# Patient Record
Sex: Female | Born: 1955 | Race: White | Hispanic: No | Marital: Married | Smoking: Former smoker
Health system: Southern US, Community
[De-identification: ages and names within clinical notes are randomized; demographics above are authoritative.]

## PROBLEM LIST (undated history)

## (undated) DIAGNOSIS — J342 Deviated nasal septum: Secondary | ICD-10-CM

## (undated) DIAGNOSIS — K635 Polyp of colon: Secondary | ICD-10-CM

## (undated) DIAGNOSIS — J189 Pneumonia, unspecified organism: Secondary | ICD-10-CM

## (undated) DIAGNOSIS — I7300 Raynaud's syndrome without gangrene: Secondary | ICD-10-CM

## (undated) DIAGNOSIS — R011 Cardiac murmur, unspecified: Secondary | ICD-10-CM

## (undated) DIAGNOSIS — E215 Disorder of parathyroid gland, unspecified: Secondary | ICD-10-CM

## (undated) DIAGNOSIS — E079 Disorder of thyroid, unspecified: Secondary | ICD-10-CM

## (undated) DIAGNOSIS — F419 Anxiety disorder, unspecified: Secondary | ICD-10-CM

## (undated) DIAGNOSIS — K5790 Diverticulosis of intestine, part unspecified, without perforation or abscess without bleeding: Secondary | ICD-10-CM

## (undated) DIAGNOSIS — R7303 Prediabetes: Secondary | ICD-10-CM

## (undated) DIAGNOSIS — K5792 Diverticulitis of intestine, part unspecified, without perforation or abscess without bleeding: Secondary | ICD-10-CM

## (undated) DIAGNOSIS — R569 Unspecified convulsions: Secondary | ICD-10-CM

## (undated) DIAGNOSIS — M810 Age-related osteoporosis without current pathological fracture: Secondary | ICD-10-CM

## (undated) DIAGNOSIS — E785 Hyperlipidemia, unspecified: Secondary | ICD-10-CM

## (undated) DIAGNOSIS — E222 Syndrome of inappropriate secretion of antidiuretic hormone: Secondary | ICD-10-CM

## (undated) DIAGNOSIS — L219 Seborrheic dermatitis, unspecified: Secondary | ICD-10-CM

## (undated) DIAGNOSIS — I10 Essential (primary) hypertension: Secondary | ICD-10-CM

## (undated) DIAGNOSIS — E871 Hypo-osmolality and hyponatremia: Secondary | ICD-10-CM

## (undated) DIAGNOSIS — I341 Nonrheumatic mitral (valve) prolapse: Secondary | ICD-10-CM

## (undated) DIAGNOSIS — H409 Unspecified glaucoma: Secondary | ICD-10-CM

## (undated) HISTORY — DX: Hyperlipidemia, unspecified: E78.5

## (undated) HISTORY — DX: Unspecified convulsions: R56.9

## (undated) HISTORY — DX: Diverticulitis of intestine, part unspecified, without perforation or abscess without bleeding: K57.92

## (undated) HISTORY — DX: Unspecified glaucoma: H40.9

## (undated) HISTORY — DX: Hypo-osmolality and hyponatremia: E87.1

## (undated) HISTORY — DX: Disorder of thyroid, unspecified: E07.9

## (undated) HISTORY — DX: Syndrome of inappropriate secretion of antidiuretic hormone: E22.2

## (undated) HISTORY — PX: CRYOTHERAPY: SHX1416

## (undated) HISTORY — DX: Raynaud's syndrome without gangrene: I73.00

## (undated) HISTORY — PX: ABDOMINAL HYSTERECTOMY: SHX81

## (undated) HISTORY — DX: Essential (primary) hypertension: I10

## (undated) HISTORY — DX: Anxiety disorder, unspecified: F41.9

## (undated) HISTORY — DX: Deviated nasal septum: J34.2

## (undated) HISTORY — DX: Diverticulosis of intestine, part unspecified, without perforation or abscess without bleeding: K57.90

## (undated) HISTORY — DX: Polyp of colon: K63.5

## (undated) HISTORY — DX: Nonrheumatic mitral (valve) prolapse: I34.1

## (undated) HISTORY — DX: Age-related osteoporosis without current pathological fracture: M81.0

## (undated) HISTORY — DX: Disorder of parathyroid gland, unspecified: E21.5

## (undated) HISTORY — DX: Cardiac murmur, unspecified: R01.1

## (undated) HISTORY — PX: TUBAL LIGATION: SHX77

---

## 1997-10-31 ENCOUNTER — Other Ambulatory Visit: Admission: RE | Admit: 1997-10-31 | Discharge: 1997-10-31 | Payer: Self-pay | Admitting: Gynecology

## 1998-11-18 ENCOUNTER — Other Ambulatory Visit: Admission: RE | Admit: 1998-11-18 | Discharge: 1998-11-18 | Payer: Self-pay | Admitting: Gynecology

## 1998-12-03 ENCOUNTER — Encounter (INDEPENDENT_AMBULATORY_CARE_PROVIDER_SITE_OTHER): Payer: Self-pay

## 1998-12-03 ENCOUNTER — Other Ambulatory Visit: Admission: RE | Admit: 1998-12-03 | Discharge: 1998-12-03 | Payer: Self-pay | Admitting: Gynecology

## 1999-12-03 ENCOUNTER — Other Ambulatory Visit: Admission: RE | Admit: 1999-12-03 | Discharge: 1999-12-03 | Payer: Self-pay | Admitting: Gynecology

## 2001-01-17 ENCOUNTER — Other Ambulatory Visit: Admission: RE | Admit: 2001-01-17 | Discharge: 2001-01-17 | Payer: Self-pay | Admitting: Gynecology

## 2002-01-17 ENCOUNTER — Other Ambulatory Visit: Admission: RE | Admit: 2002-01-17 | Discharge: 2002-01-17 | Payer: Self-pay | Admitting: Gynecology

## 2003-01-31 ENCOUNTER — Other Ambulatory Visit: Admission: RE | Admit: 2003-01-31 | Discharge: 2003-01-31 | Payer: Self-pay | Admitting: Gynecology

## 2003-01-31 ENCOUNTER — Encounter: Payer: Self-pay | Admitting: Gynecology

## 2003-01-31 ENCOUNTER — Ambulatory Visit (HOSPITAL_COMMUNITY): Admission: RE | Admit: 2003-01-31 | Discharge: 2003-01-31 | Payer: Self-pay | Admitting: Gynecology

## 2003-02-18 ENCOUNTER — Encounter: Payer: Self-pay | Admitting: Gynecology

## 2003-02-18 ENCOUNTER — Ambulatory Visit (HOSPITAL_COMMUNITY): Admission: RE | Admit: 2003-02-18 | Discharge: 2003-02-18 | Payer: Self-pay | Admitting: Gynecology

## 2004-03-10 ENCOUNTER — Other Ambulatory Visit: Admission: RE | Admit: 2004-03-10 | Discharge: 2004-03-10 | Payer: Self-pay | Admitting: Gynecology

## 2005-03-18 ENCOUNTER — Other Ambulatory Visit: Admission: RE | Admit: 2005-03-18 | Discharge: 2005-03-18 | Payer: Self-pay | Admitting: Gynecology

## 2005-07-12 ENCOUNTER — Other Ambulatory Visit: Admission: RE | Admit: 2005-07-12 | Discharge: 2005-07-12 | Payer: Self-pay | Admitting: Gynecology

## 2006-03-23 ENCOUNTER — Other Ambulatory Visit: Admission: RE | Admit: 2006-03-23 | Discharge: 2006-03-23 | Payer: Self-pay | Admitting: Gynecology

## 2007-02-16 ENCOUNTER — Encounter: Payer: Self-pay | Admitting: Family Medicine

## 2007-02-22 LAB — CONVERTED CEMR LAB
Pap Smear: NORMAL
Pap Smear: NORMAL

## 2007-04-11 ENCOUNTER — Other Ambulatory Visit: Admission: RE | Admit: 2007-04-11 | Discharge: 2007-04-11 | Payer: Self-pay | Admitting: *Deleted

## 2007-11-22 HISTORY — PX: NM MYOCAR PERF WALL MOTION: HXRAD629

## 2007-11-22 HISTORY — PX: TRANSTHORACIC ECHOCARDIOGRAM: SHX275

## 2007-12-21 ENCOUNTER — Encounter: Payer: Self-pay | Admitting: Family Medicine

## 2008-08-13 ENCOUNTER — Ambulatory Visit: Payer: Self-pay | Admitting: Family Medicine

## 2008-08-13 DIAGNOSIS — K5730 Diverticulosis of large intestine without perforation or abscess without bleeding: Secondary | ICD-10-CM | POA: Insufficient documentation

## 2008-08-13 DIAGNOSIS — J301 Allergic rhinitis due to pollen: Secondary | ICD-10-CM | POA: Insufficient documentation

## 2008-08-13 DIAGNOSIS — Z8719 Personal history of other diseases of the digestive system: Secondary | ICD-10-CM | POA: Insufficient documentation

## 2008-08-13 DIAGNOSIS — I10 Essential (primary) hypertension: Secondary | ICD-10-CM | POA: Insufficient documentation

## 2008-08-13 DIAGNOSIS — E785 Hyperlipidemia, unspecified: Secondary | ICD-10-CM | POA: Insufficient documentation

## 2008-08-13 DIAGNOSIS — M810 Age-related osteoporosis without current pathological fracture: Secondary | ICD-10-CM | POA: Insufficient documentation

## 2008-08-13 DIAGNOSIS — L259 Unspecified contact dermatitis, unspecified cause: Secondary | ICD-10-CM | POA: Insufficient documentation

## 2008-08-13 DIAGNOSIS — I059 Rheumatic mitral valve disease, unspecified: Secondary | ICD-10-CM | POA: Insufficient documentation

## 2008-10-25 ENCOUNTER — Ambulatory Visit: Payer: Self-pay | Admitting: Family Medicine

## 2008-10-25 LAB — CONVERTED CEMR LAB: LDL Cholesterol: 83 mg/dL

## 2008-10-30 LAB — CONVERTED CEMR LAB
ALT: 13 units/L (ref 0–35)
Albumin: 4.1 g/dL (ref 3.5–5.2)
BUN: 11 mg/dL (ref 6–23)
Chloride: 96 meq/L (ref 96–112)
Direct LDL: 83.4 mg/dL
Eosinophils Relative: 1.3 % (ref 0.0–5.0)
Glucose, Bld: 100 mg/dL — ABNORMAL HIGH (ref 70–99)
HCT: 37.1 % (ref 36.0–46.0)
Lymphs Abs: 2.3 10*3/uL (ref 0.7–4.0)
MCV: 99.5 fL (ref 78.0–100.0)
Monocytes Absolute: 0.8 10*3/uL (ref 0.1–1.0)
Platelets: 232 10*3/uL (ref 150.0–400.0)
Potassium: 4.6 meq/L (ref 3.5–5.1)
RDW: 12.5 % (ref 11.5–14.6)
TSH: 0.72 microintl units/mL (ref 0.35–5.50)
Total Bilirubin: 0.7 mg/dL (ref 0.3–1.2)
WBC: 7.8 10*3/uL (ref 4.5–10.5)

## 2008-11-01 ENCOUNTER — Encounter: Payer: Self-pay | Admitting: Family Medicine

## 2008-11-01 ENCOUNTER — Ambulatory Visit: Payer: Self-pay | Admitting: Family Medicine

## 2008-11-01 ENCOUNTER — Other Ambulatory Visit: Admission: RE | Admit: 2008-11-01 | Discharge: 2008-11-01 | Payer: Self-pay | Admitting: Family Medicine

## 2008-11-01 DIAGNOSIS — E222 Syndrome of inappropriate secretion of antidiuretic hormone: Secondary | ICD-10-CM | POA: Insufficient documentation

## 2008-11-04 ENCOUNTER — Encounter (INDEPENDENT_AMBULATORY_CARE_PROVIDER_SITE_OTHER): Payer: Self-pay | Admitting: *Deleted

## 2008-11-06 ENCOUNTER — Encounter (INDEPENDENT_AMBULATORY_CARE_PROVIDER_SITE_OTHER): Payer: Self-pay | Admitting: *Deleted

## 2008-11-06 ENCOUNTER — Ambulatory Visit: Payer: Self-pay | Admitting: Family Medicine

## 2008-12-07 ENCOUNTER — Ambulatory Visit: Payer: Self-pay | Admitting: Family Medicine

## 2008-12-10 ENCOUNTER — Encounter: Payer: Self-pay | Admitting: Family Medicine

## 2008-12-10 ENCOUNTER — Telehealth: Payer: Self-pay | Admitting: Family Medicine

## 2008-12-17 ENCOUNTER — Encounter (INDEPENDENT_AMBULATORY_CARE_PROVIDER_SITE_OTHER): Payer: Self-pay | Admitting: *Deleted

## 2008-12-17 LAB — CONVERTED CEMR LAB
Cholesterol: 225 mg/dL
Creatinine, Ser: 0.9 mg/dL
HDL: 122 mg/dL
LDL Cholesterol: 85 mg/dL
TSH: 1.13 microintl units/mL
Triglycerides: 44 mg/dL

## 2008-12-27 ENCOUNTER — Encounter: Payer: Self-pay | Admitting: Family Medicine

## 2008-12-27 ENCOUNTER — Ambulatory Visit: Payer: Self-pay | Admitting: Family Medicine

## 2009-06-14 ENCOUNTER — Ambulatory Visit: Payer: Self-pay | Admitting: Family Medicine

## 2009-06-18 ENCOUNTER — Telehealth: Payer: Self-pay | Admitting: Internal Medicine

## 2009-06-20 ENCOUNTER — Ambulatory Visit: Payer: Self-pay | Admitting: Gastroenterology

## 2009-06-20 ENCOUNTER — Encounter: Payer: Self-pay | Admitting: Internal Medicine

## 2009-08-15 ENCOUNTER — Ambulatory Visit: Payer: Self-pay | Admitting: Internal Medicine

## 2009-08-18 ENCOUNTER — Telehealth (INDEPENDENT_AMBULATORY_CARE_PROVIDER_SITE_OTHER): Payer: Self-pay | Admitting: *Deleted

## 2009-08-18 ENCOUNTER — Encounter: Payer: Self-pay | Admitting: Internal Medicine

## 2009-10-16 ENCOUNTER — Encounter: Payer: Self-pay | Admitting: Family Medicine

## 2009-10-29 ENCOUNTER — Ambulatory Visit: Payer: Self-pay | Admitting: Family Medicine

## 2009-10-29 LAB — CONVERTED CEMR LAB
AST: 24 units/L (ref 0–37)
Alkaline Phosphatase: 56 units/L (ref 39–117)
Basophils Absolute: 0 10*3/uL (ref 0.0–0.1)
Bilirubin, Direct: 0.2 mg/dL (ref 0.0–0.3)
Calcium: 10.2 mg/dL (ref 8.4–10.5)
GFR calc non Af Amer: 130.56 mL/min (ref >60)
HCT: 41.7 % (ref 36.0–46.0)
HDL: 135.6 mg/dL (ref >39.00)
Hemoglobin: 14.3 g/dL (ref 12.0–15.0)
Lymphs Abs: 2.2 10*3/uL (ref 0.7–4.0)
MCV: 101.1 fL — ABNORMAL HIGH (ref 78.0–100.0)
Monocytes Absolute: 0.8 10*3/uL (ref 0.1–1.0)
Neutro Abs: 5.9 10*3/uL (ref 1.4–7.7)
Platelets: 201 10*3/uL (ref 150.0–400.0)
Potassium: 4.3 meq/L (ref 3.5–5.1)
RDW: 13.9 % (ref 11.5–14.6)
Sodium: 132 meq/L — ABNORMAL LOW (ref 135–145)
TSH: 1.22 microintl units/mL (ref 0.35–5.50)
Total Bilirubin: 0.7 mg/dL (ref 0.3–1.2)
Total CHOL/HDL Ratio: 2
VLDL: 7.6 mg/dL (ref 0.0–40.0)

## 2009-11-04 ENCOUNTER — Other Ambulatory Visit: Admission: RE | Admit: 2009-11-04 | Discharge: 2009-11-04 | Payer: Self-pay | Admitting: Family Medicine

## 2009-11-04 ENCOUNTER — Ambulatory Visit: Payer: Self-pay | Admitting: Family Medicine

## 2009-11-04 DIAGNOSIS — R7309 Other abnormal glucose: Secondary | ICD-10-CM | POA: Insufficient documentation

## 2009-11-04 LAB — HM PAP SMEAR

## 2009-11-05 LAB — CONVERTED CEMR LAB: Vit D, 25-Hydroxy: 52 ng/mL (ref 30–89)

## 2009-11-13 LAB — CONVERTED CEMR LAB: Pap Smear: NEGATIVE

## 2009-11-14 ENCOUNTER — Encounter (INDEPENDENT_AMBULATORY_CARE_PROVIDER_SITE_OTHER): Payer: Self-pay | Admitting: *Deleted

## 2009-12-17 ENCOUNTER — Telehealth: Payer: Self-pay | Admitting: Internal Medicine

## 2009-12-23 ENCOUNTER — Telehealth: Payer: Self-pay | Admitting: Family Medicine

## 2009-12-24 ENCOUNTER — Ambulatory Visit: Payer: Self-pay | Admitting: Family Medicine

## 2009-12-24 ENCOUNTER — Telehealth: Payer: Self-pay | Admitting: Family Medicine

## 2009-12-29 ENCOUNTER — Encounter: Payer: Self-pay | Admitting: Family Medicine

## 2009-12-29 ENCOUNTER — Ambulatory Visit: Payer: Self-pay | Admitting: Internal Medicine

## 2010-01-20 ENCOUNTER — Ambulatory Visit: Payer: Self-pay | Admitting: Family Medicine

## 2010-01-29 ENCOUNTER — Encounter: Payer: Self-pay | Admitting: Family Medicine

## 2010-03-04 ENCOUNTER — Telehealth: Payer: Self-pay | Admitting: Family Medicine

## 2010-03-11 ENCOUNTER — Encounter: Payer: Self-pay | Admitting: Family Medicine

## 2010-03-12 ENCOUNTER — Inpatient Hospital Stay (HOSPITAL_COMMUNITY): Admission: EM | Admit: 2010-03-12 | Discharge: 2010-03-16 | Payer: Self-pay | Admitting: Emergency Medicine

## 2010-03-24 ENCOUNTER — Ambulatory Visit: Payer: Self-pay | Admitting: Family Medicine

## 2010-03-25 ENCOUNTER — Telehealth: Payer: Self-pay | Admitting: Family Medicine

## 2010-03-25 LAB — CONVERTED CEMR LAB
Basophils Relative: 0.3 % (ref 0.0–3.0)
CO2: 30 meq/L (ref 19–32)
Chloride: 96 meq/L (ref 96–112)
Creatinine, Ser: 0.7 mg/dL (ref 0.4–1.2)
Eosinophils Absolute: 0.1 10*3/uL (ref 0.0–0.7)
Eosinophils Relative: 0.7 % (ref 0.0–5.0)
HCT: 38.5 % (ref 36.0–46.0)
Lymphs Abs: 2.2 10*3/uL (ref 0.7–4.0)
MCHC: 34.4 g/dL (ref 30.0–36.0)
MCV: 102.6 fL — ABNORMAL HIGH (ref 78.0–100.0)
Monocytes Absolute: 1 10*3/uL (ref 0.1–1.0)
Neutro Abs: 6.8 10*3/uL (ref 1.4–7.7)
Neutrophils Relative %: 67.7 % (ref 43.0–77.0)
Potassium: 4.8 meq/L (ref 3.5–5.1)
RBC: 3.75 M/uL — ABNORMAL LOW (ref 3.87–5.11)
WBC: 10.1 10*3/uL (ref 4.5–10.5)

## 2010-03-26 ENCOUNTER — Encounter: Payer: Self-pay | Admitting: Family Medicine

## 2010-04-01 ENCOUNTER — Telehealth: Payer: Self-pay | Admitting: Family Medicine

## 2010-04-01 ENCOUNTER — Ambulatory Visit: Payer: Self-pay | Admitting: Family Medicine

## 2010-04-01 LAB — CONVERTED CEMR LAB
BUN: 12 mg/dL (ref 6–23)
Calcium: 10.5 mg/dL (ref 8.4–10.5)
Creatinine, Ser: 0.7 mg/dL (ref 0.4–1.2)
GFR calc non Af Amer: 88.13 mL/min (ref >60)
Glucose, Bld: 105 mg/dL — ABNORMAL HIGH (ref 70–99)

## 2010-04-14 ENCOUNTER — Ambulatory Visit: Payer: Self-pay | Admitting: Family Medicine

## 2010-06-23 NOTE — Letter (Signed)
Summary: [facility] Instructions  [facility]  [address] [place], [place] [address]   Phone: [phone]  Fax: [phone]       Candice Gray    1955/06/02    MRN: [id number]       Procedure Day /Date: 08-15-09     Arrival Time: 8:00 AM     Procedure Time: 9:00 AM     Location of Procedure:                    X     [facility] (4th Floor)    PREPARATION FOR COLONOSCOPY WITH MIRALAX  Starting 5 days prior to your procedure 08-10-09 do not eat nuts, seeds, popcorn, corn, beans, peas,  salads, or any raw vegetables.  Do not take any fiber supplements (e.g. Metamucil, Citrucel, and Benefiber). ____________________________________________________________________________________________________   THE DAY BEFORE YOUR PROCEDURE         DATE: 08-14-09 DAY: Thursday  1   Drink clear liquids the entire day-NO SOLID FOOD  2   Do not drink anything colored red or purple.  Avoid juices with pulp.  No orange juice.  3   Drink at least 64 oz. (8 glasses) of fluid/clear liquids during the day to prevent dehydration and help the prep work efficiently.  CLEAR LIQUIDS INCLUDE: Water Jello Ice Popsicles Tea (sugar ok, no milk/cream) Powdered fruit flavored drinks Coffee (sugar ok, no milk/cream) Gatorade Juice: apple, white grape, white cranberry  Lemonade Clear bullion, consomm, broth Carbonated beverages (any kind) Strained chicken noodle soup Hard Candy  4   Mix the entire bottle of Miralax with 64 oz. of Gatorade/Powerade in the morning and put in the refrigerator to chill.  5   At 3:00 pm take 2 Dulcolax/Bisacodyl tablets.  6   At 4:30 pm take one Reglan/Metoclopramide tablet.  7  Starting at 5:00 pm drink one 8 oz glass of the Miralax mixture every 15-20 minutes until you have finished drinking the entire 64 oz.  You should finish drinking prep around 7:30 or 8:00 pm.  8   If you are nauseated, you may take the 2nd Reglan/Metoclopramide tablet at  6:30 pm.        9    At 8:00 pm take 2 more DULCOLAX/Bisacodyl tablets.     THE DAY OF YOUR PROCEDURE      DATE: 08-15-09 DAY: Friday  You may drink clear liquids until 7:00 AM (2 HOURS BEFORE PROCEDURE).   MEDICATION INSTRUCTIONS  Unless otherwise instructed, you should take regular prescription medications with a small sip of water as early as possible the morning of your procedure.       OTHER INSTRUCTIONS  You will need a responsible adult at least 55 years of age to accompany you and drive you home.   This person must remain in the waiting room during your procedure.  Wear loose fitting clothing that is easily removed.  Leave jewelry and other valuables at home.  However, you may wish to bring a book to read or an iPod/MP3 player to listen to music as you wait for your procedure to start.  Remove all body piercing jewelry and leave at home.  Total time from sign-in until discharge is approximately 2-3 hours.  You should go home directly after your procedure and rest.  You can resume normal activities the day after your procedure.  The day of your procedure you should not:   Drive   Make legal decisions  Operate machinery   Drink alcohol   Return to work  You will receive specific instructions about eating, activities and medications before you leave.   The above instructions have been reviewed and explained to me by   _______________________    I fully understand and can verbalize these instructions _____________________________ Date _______

## 2010-06-23 NOTE — Assessment & Plan Note (Signed)
Summary: hospital follow up/ cone   Vital Signs:  Patient profile:   55 year old female Height:      69.75 inches Weight:      122.0 pounds BMI:     17.69 O2 Sat:      98 % on Room air Temp:     98.6 degrees F oral Pulse rate:   80 / minute Pulse rhythm:   regular BP sitting:   110 / 70  (left arm) Cuff size:   regular  Vitals Entered By: Benny Lennert CMA Duncan Dull) (March 24, 2010 8:13 AM)  O2 Flow:  Room air  History of Present Illness: Chief complaint hospital follow up   Recent hospitalization...10/20-10/24 for the following: 1. 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 2. Bilateral pneumonia, community acquired. 3. Hypokalemia - Resolved. 4. Acute cystitis.  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  Additional workup included     a serum cortisol level which came back within normal limits at 23.4     and TSH was also done and was within normal limits and her HIV test     was nonreactive.  A CT scan of her head was done which came back     negative and her medications were reviewed and none found to be the     cause of this. Discharged with sodium at 128...she has been resttricting water and adding salt to diet. In past HCTZ stopped for hyponatremia.  Completed avelox 10 days course.  Since discharge slight dry cough. No fever, mild SOB. Had episode yesterday of central chest heaviness at rest, lasted 5 min.Marland Kitchen Has appt tommorow wiht Dr. Clarene Duke. Has started walking some..no chest pain. BP stable now on metoprolol. Had stopped.  Allergies: 1)  ! * Zyban 2)  ! Astelin (Azelastine Hcl)  Review of  Systems General:  Denies fatigue and fever. CV:  Complains of chest pain or discomfort. Resp:  Denies coughing up blood, sputum productive, and wheezing. GI:  Denies abdominal pain. GU:  Denies dysuria.  Physical Exam  General:  thin healthy appearing female in NAD Eyes:  No corneal or conjunctival inflammation noted. EOMI. Perrla. Funduscopic exam benign, without hemorrhages, exudates or papilledema. Vision grossly normal. Ears:  External ear exam shows no significant lesions or deformities.  Otoscopic examination reveals clear canals, tympanic membranes are intact bilaterally without bulging, retraction, inflammation or discharge. Hearing is grossly normal bilaterally. Nose:  External nasal examination shows no deformity or inflammation. Nasal mucosa are pink and moist without lesions or exudates. Mouth:  Oral mucosa and oropharynx without lesions or exudates.  Teeth in good repair. Neck:  no carotid bruit or thyromegaly no cervical or supraclavicular lymphadenopathy  Lungs:  Normal respiratory effort, chest expands symmetrically. Lungs are clear to auscultation, no crackles or wheezes. Heart:  Normal rate and regular rhythm. S1 and S2 normal without gallop, murmur, click, rub or other extra sounds. Pulses:  R and L posterior tibial pulses are full and equal bilaterally  Extremities:  no edema    Impression & Recommendations:  Problem # 1:  PNEUMONIA, BILATERAL (ICD-486) Resolving.Moody Bruins with wbc. Completed avelox. Orders: TLB-CBC Platelet - w/Differential (85025-CBCD)  Problem # 2:  HYPONATREMIA (ICD-276.1) Likely due to multiple etiology...vomiting, volume depletion, pneumonia. Reeval today. Orders: TLB-BMP (Basic Metabolic Panel-BMET) (80048-METABOL)  Problem # 3:  HYPOKALEMIA (ICD-276.8) Reeval..likely due to emesis.   Problem # 4:  HYPERTENSION, BENIGN ESSENTIAL, LABILE (ICD-401.1) Well controlled. Continue current medication.  The following medications were removed  from the medication list:    Lotensin Hct 20-12.5 Mg Tabs (Benazepril-hydrochlorothiazide) .Marland Kitchen... Take 1 tablet by mouth once a day    Atenolol 25 Mg Tabs (Atenolol) ..... One by mouth as needed heart flutter Her updated medication list for this problem includes:    Metoprolol Tartrate 25 Mg Tabs (Metoprolol tartrate) .Marland Kitchen... Take one tablet two times a day  Complete Medication List: 1)  Boniva 150 Mg Tabs (Ibandronate sodium) .Marland Kitchen.. 1 tab by mouth monthly 2)  Metoprolol Tartrate 25 Mg Tabs (Metoprolol tartrate) .... Take one tablet two times a day  Patient Instructions: 1)  In 2-3 weeks return for Nurse visit for pneumonia vaccine. 2)  Okay to wait on starting Boniva until feeling like current issues completely resolved.  3)  Continue fluid restriction. 4)  Call if fever, SOB.   Orders Added: 1)  TLB-BMP (Basic Metabolic Panel-BMET) [80048-METABOL] 2)  TLB-CBC Platelet - w/Differential [85025-CBCD] 3)  Est. Patient Level IV [04540]    Prior Medications: BONIVA 150 MG TABS (IBANDRONATE SODIUM) 1 tab by mouth monthly Current Allergies: ! * ZYBAN ! ASTELIN (AZELASTINE HCL)

## 2010-06-23 NOTE — Letter (Signed)
Summary: Patient Notice- Polyp Results  [facility]  [address] [place], [place] [address]   Phone: [phone]  Fax: [phone]        August 18, 2009 MRN: [id number]    Candice Gray [address] [place], [place]  [address]    Dear Ms. Kinnett,  I am pleased to inform you that the colon polyp(s) removed during your recent colonoscopy was (were) found to be benign (no cancer detected) upon pathologic examination.The polyps were hyperplasti ( not premalignant).  I recommend you have a repeat colonoscopy examination in 7_ years to look for recurrent polyps, as having colon polyps increases your risk for having recurrent polyps or even colon cancer in the future.We may consider virtual colonoscopy  in the future  due to difficulties due to diverticulosis,  Should you develop new or worsening symptoms of abdominal pain, bowel habit changes or bleeding from the rectum or bowels, please schedule an evaluation with either your primary care physician or with me.  Additional information/recommendations:  _x_ No further action with gastroenterology is needed at this time. Please      follow-up with your primary care physician for your other healthcare      needs.  __ Please call [phone] to schedule a return visit to review your      situation.  __ Please keep your follow-up visit as already scheduled.  _x_ Continue treatment plan as outlined the day of your exam.Please take benefiber every day.  Please call us if you are having persistent problems or have questions about your condition that have not been fully answered at this time.  Sincerely,  Hart Carwin MD  This letter has been electronically signed by your physician.  Appended Document: Patient Notice- Polyp Results letter mailed 3.30.11

## 2010-06-23 NOTE — Progress Notes (Signed)
Summary: bloaded, gas pains  Phone Note Call from Patient Call back at Home Phone [phone]   Caller: Patient Call For: Candice Nora MD Summary of Call: Patient has been taking the alendronate for 5 weeks. She says that after starting it she started having bloating, gas pains. She thought that she was having symptoms from her diverticulitis. She says that she looked up side afftects for the alendronate and it listed these same symptoms. She is asking if she should try something else or if she should give it a little longer. She usse [facility] if needed.  Initial call taken by: Melody Comas,  March 04, 2010 9:57 AM  Follow-up for Phone Call        lets try Boniva monthly Stop alendronate Follow-up by: Candice Nora MD,  March 04, 2010 10:01 AM  Additional Follow-up for Phone Call Additional follow up Details #1::        Patient advised  Additional Follow-up by: Benny Lennert CMA Duncan Dull),  March 04, 2010 10:57 AM    New/Updated Medications: BONIVA 150 MG TABS (IBANDRONATE SODIUM) 1 tab by mouth monthly Prescriptions: BONIVA 150 MG TABS (IBANDRONATE SODIUM) 1 tab by mouth monthly  #3 x 3   Entered and Authorized by:   Candice Nora MD   Signed by:   Candice Nora MD on 03/04/2010   Method used:   Electronically to        [facility]* (retail)       [address] [place], [place]  [address]       Ph: [phone] or [phone]       Fax: [phone]   RxID:   [id number]

## 2010-06-23 NOTE — Progress Notes (Signed)
Summary: dropped off form for employment  Phone Note Call from Patient   Caller: Patient Call For: Kerby Nora MD Summary of Call: Pt was given TB test this morning, dropped off form for school employment.  Form is on your desk.   She wants to pick this up friday, when she has her test read.                            Lowella Petties CMA  December 24, 2009 9:15 AM

## 2010-06-23 NOTE — Assessment & Plan Note (Signed)
Summary: DIVERTICULITIS             (CandiceBRODIE PT.)          Candice   History of Present Illness Visit Type: Initial Consult Primary GI MD: Candice Sar MD Primary Provider: Kerby Nora, MD Requesting Provider: Kerby Nora, MD Chief Complaint: diverticulitis History of Present Illness:   [name] was last seen ten years ago for a flexible sigmoidoscopy with Candice Gray She presentd with what sounds like a second episode of diverticulitis. First episode of diverticulitis ini July 2010, treated with Cipro and Flaygl. She did fine until recurrentl LLQ pain, fever, and  nausea / vomiting started last week.  MD on call at [facility] called in Cipro and Flagyl which she is still taking and feeling better.   Has frequent constipation defined as decreased urge to defecate.  Flexible simoidoscopy hurt in the past. Nervous about having CRC screening. Scared about complications and pain involved.   GI Review of Systems    Reports abdominal pain.     Location of  Abdominal pain: LLQ.    Denies acid reflux, belching, bloating, chest pain, dysphagia with liquids, dysphagia with solids, heartburn, loss of appetite, nausea, vomiting, vomiting blood, weight loss, and  weight gain.      Reports constipation and  diverticulosis.     Denies anal fissure, black tarry stools, change in bowel habit, diarrhea, fecal incontinence, heme positive stool, hemorrhoids, irritable bowel syndrome, jaundice, light color stool, liver problems, rectal bleeding, and  rectal pain.    Current Medications (verified): 1)  Lotensin Hct 20-12.5 Mg Tabs (Benazepril-Hydrochlorothiazide) .... Take 1 Tablet By Mouth Once A Day 2)  Atenolol 25 Mg Tabs (Atenolol) .... One By Mouth As Needed Heart Flutter 3)  Coreg 12.5 Mg Tabs (Carvedilol) .... As Needed 4)  Triamcinolone Acetonide 0.1 % Crea (Triamcinolone Acetonide) .... Aaa Two Times A Day X 2 Weeks 5)  Cipro 500 Mg Tabs (Ciprofloxacin Hcl) .Marland Kitchen.. 1 By Mouth Two Times A Day 6)   Flagyl 500 Mg Tabs (Metronidazole) .Marland Kitchen.. 1 By Mouth Qid  Allergies (verified): 1)  ! * Zyban 2)  ! Astelin (Azelastine Hcl)  Past History:  Past Medical History: Hyperlipidemia Diverticulitis Diverticulosis Hypertension MVP  Past Surgical History: Reviewed history from 08/13/2008 and no changes required. cardiac stress test 11/2007 low risk ECHO 11/2007  cryptherapy for cervical dyplasia x 2  Denies surgical history  Family History: Reviewed history from 08/13/2008 and no changes required. father: HTN, high cholesterol, DM mother: healthy siblings: high cholesterol, HTN PGM: DM ZOX:WRUEA tumor No MI <age 51 no cancer known No FH of Colon Cancer:  Social History: Reviewed history from 11/01/2008 and no changes required. 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  The patient denies anemia, anxiety-new, arthritis/joint pain, back pain, blood in urine, breast changes/lumps, change in vision, confusion, cough, coughing up blood, depression-new, fainting, fatigue, fever, headaches-new, hearing problems, heart murmur, heart rhythm changes, itching, menstrual pain, muscle pains/cramps, night sweats, nosebleeds, pregnancy symptoms, shortness of breath, skin rash, sleeping problems, sore throat, swelling of feet/legs, swollen lymph glands, thirst - excessive , urination - excessive , urination changes/pain, urine leakage, vision changes, and voice change.    Vital Signs:  Patient profile:   55 year old female  Height:      69.75 inches Weight:      123.13 pounds BMI:     17.86 Pulse rate:   96 / minute Pulse rhythm:   regular BP sitting:   108 / 60  (left arm)  Vitals Entered By: Candice Gray NCMA (June 20, 2009 11:11  AM)  Physical Exam  General:  Well developed, well nourished, no acute distress. Head:  Normocephalic and atraumatic. Eyes:  Conjunctiva pink, no icterus.  Mouth:  No oral lesions. Tongue moist.  Neck:  no obvious masses  Lungs:  Clear throughout to auscultation. Heart:  Regular rate and rhythm; no murmurs, rubs,  or bruits. Abdomen:  Abdomen soft,  nondistended. Very mild LLQ pain.. No obvious masses or hepatomegaly.Normal bowel sounds.  Msk:  Symmetrical with no gross deformities. Normal posture. Extremities:  No palmar erythema, no edema.  Neurologic:  Alert and  oriented x4;  grossly normal neurologically. Skin:  Intact without significant lesions or rashes. Cervical Nodes:  No significant cervical adenopathy. Psych:  Alert and cooperative. Normal mood and affect.   Impression & Recommendations:  Problem # 1:  DIVERTICULITIS, ACUTE (ICD-562.11) Assessment Improved Treated for presumed diverticulitis in July, responded to antibiotics. Recently developed same symptoms and is again responding to Cipro and Flagyl. Only mild tenderness on exam. Continue antibiotics, low residue diet. If she has another episode then CTscan should be done to document diverticulitis.   Orders: Colonoscopy (Colon)  Problem # 2:  SCREENING COLORECTAL-CANCER (ICD-V76.51) Assessment: Comment Only Recommended CRC screening given her age. Patient nervous about pain and potential complications of colonoscopy so we discussed benefits vrs risks and potential complicattions. The patient will be scheduled for a colonoscopy with biopsies/polypectomy (if indicated).   Need to schedule 4-6weeks out given acute diverticulitis.   Orders: Colonoscopy (Colon)  Problem # 3:  MITRAL VALVE PROLAPSE (ICD-424.0) Assessment: Comment Only She inquired about antibiotics for colonoscopy-  they are no longer indicated based on guidelines by the American Heart Association and the American Society for Gastrointestinal  Endoscopy (ASGE)  Patient Instructions: 1)  Continue the Cipro and Flagyl. 2)  Advised to stick with a low residue diet  avoiding food that can irritate bowel (see handout).  3)  We have scheduled the Colonoscopy with Candice Gray on 08-15-09. 4)  Colonoscopy and conscous sedation brochure provided. 5)  Copy sent to : Candice Nora, MD 6)  The medication list was reviewed and reconciled.  All changed / newly prescribed medications were explained.  A complete medication list was provided to the patient / caregiver. Prescriptions: REGLAN 10 MG  TABS (METOCLOPRAMIDE HCL) As per prep instructions.  #2 x 0   Entered by:   Lowry Ram NCMA   Authorized by:   Willette Cluster NP   Signed by:   Lowry Ram NCMA on 06/20/2009   Method used:   Electronically to        [facility]* (retail)       [address] [place], [place]  [address]       Ph: [phone] or [phone]       Fax: [phone]   RxID:   [id number] DULCOLAX 5 MG  TBEC (BISACODYL) Day before procedure take 2 at 3pm and 2 at 8pm.  #4 x 0   Entered by:   Lowry Ram NCMA   Authorized by:   Willette Cluster NP  Signed by:   Lowry Ram NCMA on 06/20/2009   Method used:   Electronically to        [facility]* (retail)       [address] [place], [place]  [address]       Ph: [phone] or [phone]       Fax: [phone]   RxID:   [id number] MIRALAX   POWD (POLYETHYLENE GLYCOL 3350) As per prep  instructions.  #255gm x 0   Entered by:   Lowry Ram NCMA   Authorized by:   Willette Cluster NP   Signed by:   Lowry Ram NCMA on 06/20/2009   Method used:   Electronically to        [facility]* (retail)       [address] [place], [place]  [address]       Ph: [phone] or [phone]       Fax: [phone]   RxID:    [id number]

## 2010-06-23 NOTE — Letter (Signed)
Summary: [facility]  [facility]   Imported By: Maryln Gottron 04/09/2010 13:54:30  _____________________________________________________________________  External Attachment:    Type:   Image     Comment:   External Document

## 2010-06-23 NOTE — Progress Notes (Signed)
Summary: TRIAGE-Diverticulitis   Phone Note Call from Patient   Caller: Shirlee Limerick @ Dr Daphine Deutscher office [phone] Call For: Dr Juanda Chance Summary of Call: 2 Bouts of Diverticulitis -would like an appoinment with Dr Juanda Chance as soon as possible or willing to see the PA. Has not seen Dr Juanda Chance in office as far as I can see. I saw a procedure back in 2000 in the old system & she was due for a Recall back in 2007. Initial call taken by: Leanor Kail [facility],  June 18, 2009 11:15 AM  Follow-up for Phone Call        Was seen by Dr.Lowne on 06-14-09 and given antibiotics for diverticulitis. Dr.Lowne wants a GI consult. I have left a message for Shirlee Limerick: Please advise pt. of appt. scheduled w/Paula Wilmon Pali NP on 06-19-09 at 1:30pm. Please have her bring a complete list of her current meds/insurance card/co-pay. Advise pt. of Cx.policy.  Follow-up by: Laureen Ochs LPN,  June 18, 2009 11:29 AM

## 2010-06-23 NOTE — Assessment & Plan Note (Signed)
Summary: cpx/ alc   Vital Signs:  Patient profile:   55 year old female Height:      69.75 inches Weight:      122.2 pounds BMI:     17.72 Temp:     98.0 degrees F oral Pulse rate:   80 / minute Pulse rhythm:   regular BP sitting:   124 / 84  (left arm) Cuff size:   regular  Vitals Entered By: Benny Lennert CMA Duncan Dull) (November 04, 2009 2:47 PM)  History of Present Illness: Chief complaint cpx The patient is here for annual wellness exam and preventative care.     Several acute diveticulosis episodes in last year.  HTN, heart flutter, MVP.Marland Kitchenses Dr. Hortencia Conradi yearly.    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 significant decline in back since 2007. gets a lot of weight bearing exercise. Take ca and vir D two times a day.  Not interested in medication.Marland Kitchenwould like to recehck in 12/2008 instead of doing a med.   Hyponateremia improved off of HCTZ.   Prediabtes worse..but drinka lot of juice and eating bannana.   Hypertension History:      She denies headache, chest pain, palpitations, dyspnea with exertion, peripheral edema, neurologic problems, and side effects from treatment.  Well controlled on current meds. .        Positive major cardiovascular risk factors include hyperlipidemia and hypertension.  Negative major cardiovascular risk factors include female age less than 66 years old and non-tobacco-user status.     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 Take 1 Tablet By Mouth Once A Day 2)  Atenolol 25 Mg Tabs (Atenolol) .... One By Mouth As Needed Heart Flutter  Allergies: 1)  ! * Zyban 2)  ! Astelin (Azelastine Hcl)  Past History:  Past medical, surgical, family and social histories (including risk factors) reviewed, and no changes noted (except as noted below).  Past Medical History: Reviewed history from 06/20/2009 and no changes required. Hyperlipidemia Diverticulitis Diverticulosis Hypertension MVP  Past Surgical History: Reviewed history from 08/13/2008 and no changes required. cardiac stress test 11/2007 low risk ECHO 11/2007  cryptherapy for cervical dyplasia x 2  Denies surgical history  Family History: Reviewed history from 06/20/2009 and no changes required. father: HTN, high cholesterol, DM mother: healthy siblings: high cholesterol, HTN PGM: DM WJX:BJYNW tumor No MI <age 21 no cancer known No FH of Colon Cancer:  Social History: Reviewed history from 11/01/2008 and no changes required. 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 CV:  Denies chest pain or discomfort. Resp:  Denies shortness of breath. GI:  Denies abdominal pain, bloody stools, constipation, and diarrhea. GU:  Denies dysuria. Derm:  Denies lesion(s)  and rash; new mole right inner leg. Psych:  Denies anxiety, depression, and suicidal thoughts/plans.  Physical Exam  General:  thin appearing female inNAD Eyes:  No corneal or conjunctival inflammation noted. EOMI. Perrla. Funduscopic exam benign, without hemorrhages, exudates  or papilledema. Vision grossly normal. Ears:  External ear exam shows no significant lesions or deformities.  Otoscopic examination reveals clear canals, tympanic membranes are intact bilaterally without bulging, retraction, inflammation or discharge. Hearing is grossly normal bilaterally. Nose:  External nasal examination shows no deformity or inflammation. Nasal mucosa are pink and moist without lesions or exudates. Mouth:  Oral mucosa and oropharynx without lesions or exudates.  Teeth in good repair. Neck:  no carotid bruit or thyromegaly no cervical or supraclavicular lymphadenopathy  Chest Wall:  No deformities, masses, or tenderness noted. Breasts:  No mass, nodules, thickening, tenderness, bulging, retraction, inflamation, nipple discharge or skin changes noted.   Lungs:  Normal respiratory effort, chest expands symmetrically. Lungs are clear to auscultation, no crackles or wheezes. Heart:  Normal rate and regular rhythm. S1 and S2 normal without gallop, murmur, click, rub or other extra sounds. Abdomen:  Bowel sounds positive,abdomen soft and non-tender without masses, organomegaly or hernias noted. Genitalia:  Pelvic Exam:        External: normal female genitalia without lesions or masses        Vagina: normal without lesions or masses        Cervix: normal without lesions or masses        Adnexa: normal bimanual exam without masses or fullness        Uterus: normal by palpation        Pap smear: performed Msk:  No deformity or scoliosis noted of thoracic or lumbar spine.   Pulses:  R and L posterior tibial pulses are full and equal bilaterally  Extremities:  no edema Skin:  Intact without suspicious lesions or rashes Psych:  Cognition and judgment appear intact. Alert and cooperative with normal attention span and concentration. No apparent delusions, illusions, hallucinations   Impression & Recommendations:  Problem # 1:  PHYSICAL EXAMINATION (ICD-V70.0) The patient's  preventative maintenance and recommended screening tests for an annual wellness exam were reviewed in full today. Brought up to date unless services declined.  Counselled on the importance of diet, exercise, and its role in overall health and mortality. The patient's FH and SH was reviewed, including their home life, tobacco status, and drug and alcohol status.     Problem # 2:  ROUTINE GYNECOLOGICAL EXAMINATION (ICD-V72.31) PAP pending.   Problem # 3:  HYPONATREMIA (ICD-276.1) Due to medicaiton..improved.   Problem # 4:  OTHER OSTEOPOROSIS (ICD-733.09) Not open to medicaiton. Recommended weight bearing exercise, ca and vit D. Will reeval DXA in 12/2009 for 1 year check and vit D today. Orders: T-Vitamin D (25-Hydroxy) [id number]) Radiology Referral (Radiology)  Problem # 5:  PREDIABETES (ICD-790.29) Encouraged exercise, weight loss, healthy eating habits. Decreas juice and sweet fruit without peel.   Complete Medication List: 1)  Lotensin Hct 20-12.5 Mg Tabs (Benazepril-hydrochlorothiazide) .... Take 1 tablet by mouth once a day 2)  Atenolol 25 Mg Tabs (Atenolol) .... One by mouth as needed heart flutter  Hypertension Assessment/Plan:      The patient's hypertensive risk group is category B: At least one risk factor (excluding diabetes) with no target organ damage.  Her calculated 10 year risk of coronary heart disease is 3 %.  Today's blood pressure is 124/84.  Her blood pressure goal is <  140/90.  Patient Instructions: 1)  Referral Appointment Information 2)  Day/Date: 3)  Time: 4)  Place/MD: 5)  Address: 6)  Phone/Fax: 7)  Patient given appointment information. Information/Orders faxed/mailed.  8)  Decrease juice, bannanas etc.  9)  Please schedule a follow-up appointment in 1 year labs prior.  Current Allergies (reviewed today): ! * ZYBAN ! ASTELIN (AZELASTINE HCL)  Last PAP:  NEGATIVE FOR INTRAEPITHELIAL LESIONS OR MALIGNANCY. (11/01/2008 12:00:00 AM) PAP Result  Date:  11/04/2009 PAP Result:  normal PAP Next Due:  1 yr Last Mammogram:  normal (08/13/2008 2:09:51 PM) Mammogram Result Date:  10/16/2009 Mammogram Result:  normal Mammogram Next Due:  1 yr  Appended Document: cpx/ alc

## 2010-06-23 NOTE — Progress Notes (Signed)
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  She recently had a physical so she is asking if ok to just drop this off.  Also, she will need a TB test. Initial call taken by: Lowella Petties CMA,  December 23, 2009 10:43 AM  Follow-up for Phone Call        Can drop form off for completion, but needs RN appt for TB test.  Follow-up by: Kerby Nora MD,  December 23, 2009 10:48 AM  Additional Follow-up for Phone Call Additional follow up Details #1::        Patient advised.Consuello Masse CMA   Additional Follow-up by: Benny Lennert CMA Duncan Dull),  December 23, 2009 11:10 AM

## 2010-06-23 NOTE — Progress Notes (Signed)
Summary: please review lab results  Phone Note Call from Patient Call back at Home Phone [phone]   Caller: Patient Summary of Call: Pt is calling for her lab results.  She is asking that they be reviewed so that she can be advised.  She said she is on a lot of restrictions right now, wants to know if she can start drinking water again. Initial call taken by: Lowella Petties CMA, AAMA,  March 25, 2010 10:52 AM  Follow-up for Phone Call        I am going to get Dr. Daphine Deutscher input before altering plan of care. Can be a life threatening sitaution. Follow-up by: Hannah Beat MD,  March 25, 2010 11:18 AM  Additional Follow-up for Phone Call Additional follow up Details #1::        Patient advised to wait for dr Ermalene Searing.Consuello Masse CMA   Additional Follow-up by: Benny Lennert CMA Duncan Dull),  March 25, 2010 11:40 AM    Additional Follow-up for Phone Call Additional follow up Details #2::    See lab append. Follow-up by: Kerby Nora MD,  March 25, 2010 12:03 PM

## 2010-06-23 NOTE — Progress Notes (Signed)
  Phone Note Call from Patient   Summary of Call: Pt states that she has several questions related to her procedure results and about the side effects that she is having from the anesthesia. She states that she spoke to the doctor on call on Friday and again on Saturday am  regarding her shakiness and low B/P. She stated that she was told to go to ER after conversation on Sat. but did not go due to cost.RN told pt that with most people the effects of the anesthesia wear off within 24 hours, but that everyone is different.  Told that Dr Juanda Chance will be contacting her sometime today.  Work number is [phone] and cell is [phone]. Initial call taken by: Oda Cogan RN,  August 18, 2009 9:02 AM

## 2010-06-23 NOTE — Letter (Signed)
Summary: Results Follow up Letter  [facility]  [address] [place], [place] [address]   Phone: [phone]  Fax: [phone]    11/14/2009 MRN: [id number]     Candice Gray [address] [place], [place]  [address]    Dear Ms. Wiegert,  The following are the results of your recent test(s):  Test         Result    Pap Smear:        Normal ___x__  Not Normal _____ Comments:Repeat in 1 year ______________________________________________________ Cholesterol: LDL(Bad cholesterol):         Your goal is less than:         HDL (Good cholesterol):       Your goal is more than: Comments:  ______________________________________________________ Mammogram:        Normal _____  Not Normal _____ Comments:  ___________________________________________________________________ Hemoccult:        Normal _____  Not normal _______ Comments:    _____________________________________________________________________ Other Tests:    We routinely do not discuss normal results over the telephone.  If you desire a copy of the results, or you have any questions about this information we can discuss them at your next office visit.   Sincerely,  Kerby Nora MD

## 2010-06-23 NOTE — Assessment & Plan Note (Signed)
Summary: DIVERTICULITIS   Vital Signs:  Patient profile:   55 year old female Weight:      125 pounds O2 Sat:      95 % on Room air Temp:     98.4 degrees F oral Pulse rate:   72 / minute Pulse rhythm:   regular BP sitting:   158 / 90  (left arm) Cuff size:   regular  Vitals Entered By: Mervin Hack CMA Duncan Dull) (June 14, 2009 12:37 PM)  O2 Flow:  Room air CC: abdominal pain, Abdominal Pain   History of Present Illness:       This is a 55 year old woman who presents with Abdominal Pain.  The symptoms began 3 days ago.  The patient reports constipation, but denies nausea, vomiting, diarrhea, melena, hematochezia, and anorexia.  The location of the pain is diffuse.  The pain is described as constant.  The patient denies the following symptoms: fever, weight loss, dysuria, chest pain, jaundice, dark urine, missed menstrual period, and vaginal bleeding.    Current Medications (verified): 1)  Lotensin Hct 20-12.5 Mg Tabs (Benazepril-Hydrochlorothiazide) .... Take 1 Tablet By Mouth Once A Day 2)  Atenolol 25 Mg Tabs (Atenolol) .... One By Mouth As Needed Heart Flutter 3)  Coreg 12.5 Mg Tabs (Carvedilol) .... As Needed 4)  Triamcinolone Acetonide 0.1 % Crea (Triamcinolone Acetonide) .... Aaa Two Times A Day X 2 Weeks 5)  Promethazine Hcl 50 Mg Tabs (Promethazine Hcl) .Marland Kitchen.. 1 By Mouth Qid As Needed 6)  Cipro 500 Mg Tabs (Ciprofloxacin Hcl) .Marland Kitchen.. 1 By Mouth Two Times A Day 7)  Flagyl 500 Mg Tabs (Metronidazole) .Marland Kitchen.. 1 By Mouth Qid  Allergies: 1)  ! * Zyban 2)  ! Astelin (Azelastine Hcl)  Past History:  Past medical, surgical, family and social histories (including risk factors) reviewed for relevance to current acute and chronic problems.  Past Medical History: Reviewed history from 08/13/2008 and no changes required. Hyperlipidemia  Past Surgical History: Reviewed history from 08/13/2008 and no changes required. cardiac stress test 11/2007 low risk ECHO 11/2007  cryptherapy  for cervical dyplasia x 2  Denies surgical history  Family History: Reviewed history from 08/13/2008 and no changes required. father: HTN, high cholesterol, DM mother: healthy siblings: high cholesterol, HTN PGM: DM [id number] tumor No MI <age 10 no cancer known  Social History: Reviewed history from 11/01/2008 and no changes required. 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  pt c/o no pain with palpation Cervical Nodes:  No lymphadenopathy noted Psych:  Cognition and judgment appear intact. Alert and cooperative with normal attention span and concentration. No apparent delusions, illusions, hallucinations   Impression & Recommendations:  Problem # 1:  DIVERTICULITIS, ACUTE (ICD-562.11) HX of---   Orders: Gastroenterology Referral (GI) Promethazine up to 50mg  (J2550) Admin of Therapeutic Inj  intramuscular or subcutaneous (56213)  Problem # 2:  NAUSEA WITH VOMITING (ICD-787.01) phenergan 50 mg im phenergan 25 mg tabs to er if con't  Complete Medication List: 1)  Lotensin Hct 20-12.5 Mg Tabs (Benazepril-hydrochlorothiazide) .... Take 1 tablet by mouth once a day 2)  Atenolol 25 Mg Tabs (Atenolol) .... One by mouth as needed heart flutter 3)  Coreg 12.5 Mg Tabs (Carvedilol) .... As needed 4)  Triamcinolone Acetonide 0.1 % Crea (Triamcinolone acetonide) .... Aaa two times a day x 2 weeks 5)  Promethazine Hcl 50 Mg Tabs (Promethazine hcl) .Marland Kitchen.. 1 by mouth qid as needed 6)  Cipro 500 Mg Tabs (Ciprofloxacin  hcl) .Marland Kitchen.. 1 by mouth two times a day 7)  Flagyl 500 Mg Tabs (Metronidazole) .Marland Kitchen.. 1 by mouth qid Prescriptions: FLAGYL 500 MG TABS (METRONIDAZOLE) 1 by mouth qid  #40 x 0   Entered and Authorized by:   Loreen Freud DO   Signed by:   Loreen Freud DO on 06/14/2009   Method used:   Electronically to        [facility]* (retail)       [address] [place], [place]  [address]       Ph: [phone] or [phone]       Fax: [phone]   RxID:   [id number] CIPRO 500 MG TABS (CIPROFLOXACIN HCL) 1 by mouth two times a day  #20 x 0   Entered and Authorized by:   Loreen Freud DO   Signed by:   Loreen Freud DO on 06/14/2009   Method used:   Electronically to        [facility]* (retail)       [address] [place], [place]  [address]       Ph: [phone] or [phone]       Fax: [phone]   RxID:   [id number] PROMETHAZINE HCL 50 MG TABS (PROMETHAZINE HCL) 1 by mouth qid as needed  #20 x 0   Entered and Authorized by:   Loreen Freud DO   Signed by:   Loreen Freud DO on 06/14/2009   Method used:   Electronically to        [facility]* (retail)       [address] [place], [place]  [address]       Ph: [phone] or [phone]       Fax: [phone]   RxID:   [id number]   Current Allergies (reviewed today): ! * ZYBAN ! ASTELIN (AZELASTINE HCL)   Medication Administration  Injection # 1:    Medication: Promethazine up to 50mg     Diagnosis: DIVERTICULITIS, ACUTE (ICD-562.11)    Route: IM    Site: LUOQ gluteus    Exp Date: 08/22/2009    Lot #: [id number]    Mfr: Novartis    Comments: patient given 1ml    Patient tolerated injection without complications    Given by: Mervin Hack CMA Duncan Dull) (June 14, 2009 1:00 PM)  Orders Added: 1)  Est. Patient Level III [71245] 2)   Gastroenterology Referral [GI] 3)  Promethazine up to 50mg  [J2550] 4)  Admin of Therapeutic Inj  intramuscular or subcutaneous [80998]

## 2010-06-23 NOTE — Assessment & Plan Note (Signed)
Summary: PNEUMONIA SHOT/BEDSOLE/CLE  Nurse Visit   Vitals Entered By: Benny Lennert CMA Duncan Dull) (April 14, 2010 8:58 AM)  Allergies: 1)  ! * Zyban 2)  ! Astelin (Azelastine Hcl)  Immunizations Administered:  Pneumonia Vaccine:    Vaccine Type: Pneumovax    Site: left deltoid    Mfr: Merck    Dose: 0.5 ml    Route: [place]    Given by: Benny Lennert CMA (AAMA)    Exp. Date: 09/18/2011    Lot #: [id number]    VIS given: 04/28/09 version given April 14, 2010.  Orders Added: 1)  Pneumococcal Vaccine [90732] 2)  Admin 1st Vaccine [64403]

## 2010-06-23 NOTE — Assessment & Plan Note (Signed)
Summary: DISCUSS DEXA RESULTS/CLE   Vital Signs:  Patient profile:   55 year old female Height:      69.75 inches Weight:      121.4 pounds BMI:     17.61 Temp:     98.3 degrees F oral Pulse rate:   80 / minute Pulse rhythm:   regular BP sitting:   120 / 80  (left arm) Cuff size:   regular  Vitals Entered By: Benny Lennert CMA Duncan Dull) (January 20, 2010 8:18 AM)  History of Present Illness: Chief complaint Discuss Dexa results  Osteoporosis: Interval change in DXA from last year. Working on lifestyle change...exercise and ca and vit D.  She did fall and possibly fracture right rib...gradual soreness improving now. Lost balance, no proceedgin symptoms.   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 Take 1 Tablet By Mouth Once A Day 2)  Atenolol 25 Mg Tabs (Atenolol) .... One By Mouth As Needed Heart Flutter 3)  Vitamin C 1000 Mg Tabs (Ascorbic Acid) .... Once A Day 4)  Super Calcium/d 600-125 Mg-Unit Tabs (Calcium-Vitamin D) .... One Vitamin Daily 5)  Centrum Silver Ultra Womens  Tabs (Multiple Vitamins-Minerals) .... Once Daily  Allergies: 1)  ! * Zyban 2)  ! Astelin (Azelastine Hcl)  Past History:  Past medical, surgical, family and social histories (including risk factors) reviewed, and no changes noted (except as noted below).  Past Medical  History: Reviewed history from 06/20/2009 and no changes required. Hyperlipidemia Diverticulitis Diverticulosis Hypertension MVP  Past Surgical History: Reviewed history from 08/13/2008 and no changes required. cardiac stress test 11/2007 low risk ECHO 11/2007  cryptherapy for cervical dyplasia x 2  Denies surgical history  Family History: Reviewed history from 06/20/2009 and no changes required. father: HTN, high cholesterol, DM mother: healthy siblings: high cholesterol, HTN PGM: DM UJW:JXBJY tumor No MI <age 86 no cancer known No FH of Colon Cancer:  Social History: Reviewed history from 11/01/2008 and no changes required. 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 CV:  Denies chest pain or discomfort. GI:  Denies abdominal pain, constipation, diarrhea, and indigestion.  Physical Exam  General:  thin healthy appearing female in NAD Mouth:  Oral mucosa and oropharynx without lesions or exudates.  Teeth in good repair. Lungs:  Normal respiratory effort, chest expands symmetrically. Lungs are clear to auscultation, no crackles or wheezes. Heart:  Normal rate and regular rhythm. S1 and S2 normal without gallop, murmur, click, rub or other extra sounds. Pulses:  R and L posterior tibial pulses are full and equal bilaterally  Extremities:  no edema   Impression & Recommendations:  Problem # 1:  OTHER OSTEOPOROSIS (ICD-733.09) Interval worsening. Discussed medication options. Begin fosamax and recheck DXA in 1 year.  Continue Ca, vit D and weight  bearing exercise.  Her updated medication list for this problem includes:    Super Calcium/d 600-125 Mg-unit Tabs (Calcium-vitamin d) ..... One vitamin daily    Alendronate Sodium 70 Mg Tabs  (Alendronate sodium) .Marland Kitchen... 1 tab by mouth daily  Complete Medication List: 1)  Lotensin Hct 20-12.5 Mg Tabs (Benazepril-hydrochlorothiazide) .... Take 1 tablet by mouth once a day 2)  Atenolol 25 Mg Tabs (Atenolol) .... One by mouth as needed heart flutter 3)  Vitamin C 1000 Mg Tabs (Ascorbic acid) .... Once a day 4)  Super Calcium/d 600-125 Mg-unit Tabs (Calcium-vitamin d) .... One vitamin daily 5)  Centrum Silver Ultra Womens Tabs (Multiple vitamins-minerals) .... Once daily 6)  Alendronate Sodium 70 Mg Tabs (Alendronate sodium) .Marland Kitchen.. 1 tab by mouth daily  Patient Instructions: 1)  Start with fosamax weekly. 2)   Take 1 hr from meal and do not lie down for at least an hour. 3)  Call if any SE. Prescriptions: ALENDRONATE SODIUM 70 MG TABS (ALENDRONATE SODIUM) 1 tab by mouth daily  #4 x 11   Entered and Authorized by:   Kerby Nora MD   Signed by:   Kerby Nora MD on 01/20/2010   Method used:   Electronically to        [facility]* (retail)       [address] [place], [place]  [address]       Ph: [phone] or [phone]       Fax: [phone]   RxID:   [id number]   Current Allergies (reviewed today): ! * ZYBAN ! ASTELIN (AZELASTINE HCL)

## 2010-06-23 NOTE — Letter (Signed)
Summary: [facility]  [facility]   Imported By: Lanelle Bal 02/27/2010 09:12:42  _____________________________________________________________________  External Attachment:    Type:   Image     Comment:   External Document

## 2010-06-23 NOTE — Assessment & Plan Note (Signed)
Summary: tb test/dlo  Nurse Visit   Allergies: 1)  ! * Zyban 2)  ! Astelin (Azelastine Hcl)  Immunizations Administered:  PPD Skin Test:    Vaccine Type: PPD    Site: left forearm    Mfr: Sanofi Pasteur    Dose: 0.1 ml    Route: ID    Given by: Lowella Petties CMA    Exp. Date: 03/06/2011    Lot #: [id number]  Orders Added: 1)  TB Skin Test [86580] 2)  Admin 1st Vaccine [90471]  Appended Document: tb test/dlo    Clinical Lists Changes  Observations: Added new observation of TB PPDRESULT: negative (12/26/2009 12:15) Added new observation of PPD RESULT: < 5mm (12/26/2009 12:15) Added new observation of TB-PPD RDDTE: 12/26/2009 (12/26/2009 12:15)       PPD Results    Date of reading: 12/26/2009    Results: < 5mm    Interpretation: negative

## 2010-06-23 NOTE — Procedures (Signed)
Summary: Colonoscopy  Patient: Candice Gray Note: All result statuses are Final unless otherwise noted.  Tests: (1) Colonoscopy (COL)   COL Colonoscopy           DONE     [facility]     [address]     [place], [place]  [address]           COLONOSCOPY PROCEDURE REPORT           PATIENT:  [name], [name]  MR#:  [id number]     BIRTHDATE:  Oct 13, 1955, 53 yrs. old  GENDER:  female     ENDOSCOPIST:  Hedwig Morton. Juanda Chance, MD     REF. BY:  Excell Seltzer, M.D.     PROCEDURE DATE:  08/15/2009     PROCEDURE:  Colonoscopy 19147     ASA CLASS:  Class I     INDICATIONS:  Routine Risk Screening flex sigm 1995 and 2000-     diverticuli,     s/p diverticulitis 12/2008     MEDICATIONS:   Versed 12 mg, Fentanyl 125 mcg           DESCRIPTION OF PROCEDURE:   After the risks benefits and     alternatives of the procedure were thoroughly explained, informed     consent was obtained.  No rectal exam performed. The LB [id number]     [id number] endoscope was introduced through the anus and advanced to     the cecum, which was identified by both the appendix and ileocecal     valve, without limitations.  The quality of the prep was good,     using MiraLax.  The instrument was then slowly withdrawn as the     colon was fully examined.     <<PROCEDUREIMAGES>>           FINDINGS:  Severe diverticulosis was found (see image4 and     image3). partial obstruction of the sigmoid colon, deep     diverticuli, large hypertrophied folds, had to switch to a     pediatric scope  Three polyps were found in the sigmoid colon. 2-3     mm polyps removed from around 20 cm The polyps were removed using     cold biopsy forceps (see image6, image2, and image1).  This was     otherwise a normal examination of the colon (see image7, image8,     image6, and image10).   Retroflexed views in the rectum revealed     no abnormalities.    The scope was then withdrawn from the patient     and the procedure completed.         COMPLICATIONS:  None     ENDOSCOPIC IMPRESSION:     1) Severe diverticulosis     2) Three polyps in the sigmoid colon     3) Otherwise normal examination     partial sigmoid obstruction     RECOMMENDATIONS:     1) high fiber diet     2) Await biopsy results     3) Await pathology results     fiber supplements     REPEAT EXAM:  In 7 year(s) for.           ______________________________     Hedwig Morton. Juanda Chance, MD           CC:           n.     eSIGNED:   Hedwig Morton. Tauni Sanks at  08/15/2009 10:06 AM           Fredderick Severance, [id number]  Note: An exclamation mark (!) indicates a result that was not dispersed into the flowsheet. Document Creation Date: 08/15/2009 12:51 PM _______________________________________________________________________  (1) Order result status: Final Collection or observation date-time: 08/15/2009 09:55 Requested date-time:  Receipt date-time:  Reported date-time:  Referring Physician:   Ordering Physician: [name] [phone]) Specimen Source:  Source: Launa Grill Order Number: [id number] Lab site:   Appended Document: Colonoscopy     Procedures Next Due Date:    Colonoscopy: 07/2016

## 2010-06-23 NOTE — Letter (Signed)
Summary: [facility]  [facility]   Imported By: Maryln Gottron 03/30/2010 13:40:54  _____________________________________________________________________  External Attachment:    Type:   Image     Comment:   External Document

## 2010-06-23 NOTE — Progress Notes (Signed)
Summary: Triage  Phone Note Call from Patient Call back at Home Phone [phone]   Caller: Patient Call For: Dr. Juanda Chance Reason for Call: Talk to Nurse Summary of Call: Had a diverticulitis flareup last night....this morning it is better but she is running low grade temp. Initial call taken by: Karna Christmas,  December 17, 2009 11:31 AM  Follow-up for Phone Call        Patient  c/o lower midline abdominal pain, denies constipation.  Sudden pain with vomiting last night x2.  Fine this am, no further vomiting or pain, tolerating a full liquid diet this am with no problems.  Temp this am is 99.0, "this is not my typical diverticulitis pain".  Patient  is advised that she should stay on a low fiber/low residue diet over the next 24 hours then slowly advance her diet.  If pain returns call back or contact her primary care.   Follow-up by: Darcey Nora RN, CGRN,  December 17, 2009 11:42 AM  Additional Follow-up for Phone Call Additional follow up Details #1::        Lavonna Rua, agree with above. If symptoms return,should get CBC. Any urinary symptoms?  If so, needs U/A. Thanks  Additional Follow-up by: Willette Cluster NP,  December 18, 2009 6:58 PM    Additional Follow-up for Phone Call Additional follow up Details #2::    reviewed and agree. Follow-up by: Hart Carwin MD,  December 20, 2009 9:28 AM

## 2010-06-23 NOTE — Progress Notes (Signed)
Summary: pt needs CXR  Phone Note Call from Patient Call back at Home Phone [phone]   Caller: Patient Call For: Kerby Nora MD Summary of Call: Pt says she met with Dr Clarene Duke, her cardiologist, last week and he has released her but he wants her to have a cxr before thanksgiving to be sure her pneumonia has cleared up.  His concern is that pt never showed any symptoms prior to her hospitalization and no congestion was ever present.  Also, she is asking if she can have an pneumonia shot in the next 2 weeks or so. Initial call taken by: Lowella Petties CMA, AAMA,  April 01, 2010 9:56 AM  Follow-up for Phone Call        Yes, she can come in for pneumonia shot in last 2 weeks. Schedule her to come in for 2 view CXR in next 1-2 weeks Dx 486    Follow-up by: Kerby Nora MD,  April 01, 2010 10:34 AM  Additional Follow-up for Phone Call Additional follow up Details #1::        Patient wants to wait until labs come back to schedule appt.Consuello Masse CMA   Additional Follow-up by: Benny Lennert CMA Duncan Dull),  April 01, 2010 11:14 AM

## 2010-06-23 NOTE — Miscellaneous (Signed)
Summary: BONE DENSITY  Clinical Lists Changes  Orders: Added new Test order of T-Bone Densitometry (77080) - Signed Added new Test order of T-Lumbar Vertebral Assessment (77082) - Signed 

## 2010-06-29 ENCOUNTER — Telehealth: Payer: Self-pay | Admitting: Family Medicine

## 2010-07-01 ENCOUNTER — Other Ambulatory Visit (INDEPENDENT_AMBULATORY_CARE_PROVIDER_SITE_OTHER): Payer: BC Managed Care – PPO

## 2010-07-01 ENCOUNTER — Other Ambulatory Visit: Payer: Self-pay | Admitting: Family Medicine

## 2010-07-01 ENCOUNTER — Encounter (INDEPENDENT_AMBULATORY_CARE_PROVIDER_SITE_OTHER): Payer: Self-pay | Admitting: *Deleted

## 2010-07-01 DIAGNOSIS — R7309 Other abnormal glucose: Secondary | ICD-10-CM

## 2010-07-01 DIAGNOSIS — E871 Hypo-osmolality and hyponatremia: Secondary | ICD-10-CM

## 2010-07-01 DIAGNOSIS — E876 Hypokalemia: Secondary | ICD-10-CM

## 2010-07-01 LAB — BASIC METABOLIC PANEL
CO2: 29 mEq/L (ref 19–32)
Calcium: 10.4 mg/dL (ref 8.4–10.5)
Creatinine, Ser: 0.7 mg/dL (ref 0.4–1.2)
GFR: 100.67 mL/min (ref >60.00)
Sodium: 129 mEq/L — ABNORMAL LOW (ref 135–145)

## 2010-07-01 LAB — HEPATIC FUNCTION PANEL
Alkaline Phosphatase: 52 U/L (ref 39–117)
Bilirubin, Direct: 0.1 mg/dL (ref 0.0–0.3)
Total Bilirubin: 0.4 mg/dL (ref 0.3–1.2)
Total Protein: 7.6 g/dL (ref 6.0–8.3)

## 2010-07-03 ENCOUNTER — Other Ambulatory Visit: Payer: Self-pay | Admitting: Family Medicine

## 2010-07-03 ENCOUNTER — Ambulatory Visit (INDEPENDENT_AMBULATORY_CARE_PROVIDER_SITE_OTHER)
Admission: RE | Admit: 2010-07-03 | Discharge: 2010-07-03 | Disposition: A | Discharge status: Home / Self Care | Payer: BC Managed Care – PPO | Source: Ambulatory Visit | Attending: Family Medicine | Admitting: Family Medicine

## 2010-07-03 ENCOUNTER — Ambulatory Visit (INDEPENDENT_AMBULATORY_CARE_PROVIDER_SITE_OTHER): Payer: BC Managed Care – PPO | Admitting: Family Medicine

## 2010-07-03 ENCOUNTER — Encounter: Payer: Self-pay | Admitting: Family Medicine

## 2010-07-03 ENCOUNTER — Ambulatory Visit: Payer: BC Managed Care – PPO | Admitting: Family Medicine

## 2010-07-03 DIAGNOSIS — E236 Other disorders of pituitary gland: Secondary | ICD-10-CM

## 2010-07-03 DIAGNOSIS — I10 Essential (primary) hypertension: Secondary | ICD-10-CM

## 2010-07-03 LAB — CONVERTED CEMR LAB
Nitrite: NEGATIVE
Protein, U semiquant: NEGATIVE
WBC Urine, dipstick: NEGATIVE

## 2010-07-03 LAB — CBC WITH DIFFERENTIAL/PLATELET
Basophils Relative: 0.3 % (ref 0.0–3.0)
Eosinophils Absolute: 0.1 10*3/uL (ref 0.0–0.7)
HCT: 43.9 % (ref 36.0–46.0)
Hemoglobin: 15.1 g/dL — ABNORMAL HIGH (ref 12.0–15.0)
Lymphocytes Relative: 25 % (ref 12.0–46.0)
Lymphs Abs: 3.1 10*3/uL (ref 0.7–4.0)
MCHC: 34.4 g/dL (ref 30.0–36.0)
Neutro Abs: 8.5 10*3/uL — ABNORMAL HIGH (ref 1.4–7.7)
RBC: 4.34 Mil/uL (ref 3.87–5.11)
RDW: 14 % (ref 11.5–14.6)

## 2010-07-07 ENCOUNTER — Ambulatory Visit: Payer: BC Managed Care – PPO | Admitting: Family Medicine

## 2010-07-07 ENCOUNTER — Other Ambulatory Visit: Payer: Self-pay | Admitting: Family Medicine

## 2010-07-07 ENCOUNTER — Other Ambulatory Visit (INDEPENDENT_AMBULATORY_CARE_PROVIDER_SITE_OTHER): Payer: BC Managed Care – PPO

## 2010-07-07 ENCOUNTER — Encounter (INDEPENDENT_AMBULATORY_CARE_PROVIDER_SITE_OTHER): Payer: Self-pay | Admitting: *Deleted

## 2010-07-07 DIAGNOSIS — E236 Other disorders of pituitary gland: Secondary | ICD-10-CM

## 2010-07-07 LAB — BASIC METABOLIC PANEL
BUN: 10 mg/dL (ref 6–23)
Chloride: 96 mEq/L (ref 96–112)
Creatinine, Ser: 0.7 mg/dL (ref 0.4–1.2)
Glucose, Bld: 104 mg/dL — ABNORMAL HIGH (ref 70–99)
Potassium: 4.3 mEq/L (ref 3.5–5.1)

## 2010-07-09 NOTE — Progress Notes (Signed)
Summary: wants soium level checked  Phone Note Call from Patient Call back at Home Phone [phone]   Caller: Patient Call For: Kerby Nora MD Summary of Call: Patient is asking if she could get her sodium checked again. She says her blood pressure has been up and down. She has also been having some cramping in her legs and feet, she says that this is the way it all started out when she was hospitalized the first time with low sodium. Pleae advise.  Initial call taken by: Melody Comas,  June 29, 2010 2:41 PM  Follow-up for Phone Call        CMET Dx 401.1 Follow-up by: Kerby Nora MD,  June 30, 2010 8:14 AM  Additional Follow-up for Phone Call Additional follow up Details #1::        appt made for tommorow at 9:00am for labs Additional Follow-up by: Benny Lennert CMA Duncan Dull),  June 30, 2010 8:37 AM

## 2010-07-09 NOTE — Assessment & Plan Note (Signed)
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 had vomiting, volume delpetion at the time as well.  Additional workup included    a serum cortisol level which came back within normal limits at 23.4     and TSH was also done and was within normal limits and her HIV test     was nonreactive.  A CT scan of her head was done which came back     negative and her medications were reviewed and none found to be the     cause of this.  Also previously take on HCTZ for similar issue.   Lisinopril.. has SIADH listed as rare SE. HAs been taking twice day regularly for last 2 -3 weeks.    She reports that she was feeling well until 1 month ago...feeling colder, feels trembly inside.  Very thirsty.  Only drinking 16-24 ounces a day.  leg cramps.   Mild cough in AMs, post nasl drip, mild shortness of breath ging up steps.. since PNA.  Has noted she urinates a lot more lately... always hasurinated a lot.  BP fluctuating...140/90s.. she wants to discuss with Dr. Clarene Duke before we make adjustments.   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 1 Tab By Mouth Monthly 2)  Metoprolol Tartrate 25 Mg Tabs (Metoprolol Tartrate) .... Take One Tablet Two Times A Day 3)  Lisinopril 20 Mg Tabs (Lisinopril) .... Two Times A Day 4)  Calcium-Carb 600 + D 600-125 Mg-Unit Tabs (Calcium Carbonate-Vitamin D) .... Two Times A Day 5)  Vitamin C Cr 500 Mg Cr-Caps (Ascorbic Acid) .... Once Daily  Allergies: 1)  ! * Zyban 2)  ! Astelin (Azelastine Hcl)  Past History:  Past medical, surgical, family and social histories (including risk factors) reviewed, and no changes noted (except as noted below).  Past Medical History: Reviewed history from 06/20/2009 and no changes required. Hyperlipidemia Diverticulitis Diverticulosis Hypertension MVP  Past Surgical History: Reviewed history from 08/13/2008 and no changes required. cardiac stress test 11/2007 low risk ECHO 11/2007  cryptherapy for cervical dyplasia x 2  Denies surgical history  Family History: Reviewed history from 06/20/2009 and no changes required. father: HTN, high cholesterol, DM mother: healthy siblings: high cholesterol, HTN PGM: DM ZOX:WRUEA tumor No MI <  age 32 no cancer known No FH of Colon Cancer:  Social History: Reviewed history from 11/01/2008 and no changes required. 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 CV:  Denies chest pain or discomfort. Resp:  Denies shortness of breath, sputum productive, and wheezing. GI:  Denies abdominal pain, diarrhea, and  vomiting. GU:  Denies dysuria.  Physical Exam  General:  thin healthy appearing female in NAD Eyes:  No corneal or conjunctival inflammation noted. EOMI. Perrla. Funduscopic exam benign, without hemorrhages, exudates or papilledema. Vision grossly normal. Ears:  External ear exam shows no significant lesions or deformities.  Otoscopic examination reveals clear canals, tympanic membranes are intact bilaterally without bulging, retraction, inflammation or discharge. Hearing is grossly normal bilaterally. Nose:  External nasal examination shows no deformity or inflammation. Nasal mucosa are pink and moist without lesions or exudates. Mouth:  Oral mucosa and oropharynx without lesions or exudates.  Teeth in good repair. Neck:  no carotid bruit or thyromegaly no cervical or supraclavicular lymphadenopathy  Lungs:  Normal respiratory effort, chest expands symmetrically. Lungs are clear to auscultation, no crackles or wheezes. Heart:  Normal rate and regular rhythm. S1 and S2 normal without gallop, murmur, click, rub or other extra sounds. Abdomen:  Bowel sounds positive,abdomen soft and non-tender without masses, organomegaly or hernias noted. 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 Recurrent issue for pt.  ? due to reare SE of lisinopril... will have Dr. Helane Gunther to different med. Eval TSH, cbc . Eval CXR given cough, SOB and precense of small effusions at last CXR recheck. Urine eval as well given increase in urination... clear on microscopic exam.   Fluid restriction... return for Na rechec  in 4 days  Orders: TLB-TSH (Thyroid Stimulating Hormone) (84443-TSH) UA Dipstick w/o Micro (manual) (40981) T-2 View CXR (71020TC) TLB-CBC Platelet - w/Differential (85025-CBCD)  Problem # 2:  HYPERTENSION, BENIGN  ESSENTIAL, LABILE (ICD-401.1) Assessment: Deteriorated Fluctuating. Meds to be adressed by her cardiologist.. per pt preference.  She will contact his office today.  Her updated medication list for this problem includes:    Metoprolol Tartrate 25 Mg Tabs (Metoprolol tartrate) .Marland Kitchen... Take one tablet two times a day    Lisinopril 20 Mg Tabs (Lisinopril) .Marland Kitchen..Marland Kitchen Two times a day  Complete Medication List: 1)  Boniva 150 Mg Tabs (Ibandronate sodium) .Marland Kitchen.. 1 tab by mouth monthly 2)  Metoprolol Tartrate 25 Mg Tabs (Metoprolol tartrate) .... Take one tablet two times a day 3)  Lisinopril 20 Mg Tabs (Lisinopril) .... Two times a day 4)  Calcium-carb 600 + D 600-125 Mg-unit Tabs (Calcium carbonate-vitamin d) .... Two times a day 5)  Vitamin C Cr 500 Mg Cr-caps (Ascorbic acid) .... Once daily  Patient Instructions: 1)  Return to volume restiction. 2)  Stop lisinopril. Call Dr. Clarene Duke for recommendations on what to replace this with.  3)  Return for BMET in 4 days Dx 253.6.   Orders Added: 1)  TLB-TSH (Thyroid Stimulating Hormone) [84443-TSH] 2)  UA Dipstick w/o Micro (manual) [81002] 3)  T-2 View CXR [71020TC] 4)  TLB-CBC Platelet - w/Differential [85025-CBCD] 5)  Est. Patient  Level IV [[address]]    Current Allergies (reviewed today): ! * ZYBAN ! ASTELIN (AZELASTINE HCL)  Laboratory Results   Urine Tests  Date/Time Received: July 03, 2010 9:58 AM  Date/Time Reported: July 03, 2010 9:58 AM   Routine Urinalysis   Color: yellow Appearance: Clear Glucose: negative   (Normal Range: Negative) Bilirubin: negative   (Normal Range: Negative) Ketone: negative   (Normal Range: Negative) Spec. Gravity: <1.005   (Normal Range: 1.003-1.035) Blood: moderate   (Normal Range: Negative) pH: 6.5   (Normal Range: 5.0-8.0) Protein: negative   (Normal Range: Negative) Urobilinogen: 0.2   (Normal Range: 0-1) Nitrite: negative   (Normal Range: Negative) Leukocyte Esterace: negative   (Normal Range:  Negative)  Urine Microscopic WBC/HPF: 0 RBC/HPF: o Epithelial/HPF: occ Casts/LPF: none

## 2010-07-14 ENCOUNTER — Other Ambulatory Visit: Payer: Self-pay | Admitting: Family Medicine

## 2010-07-14 ENCOUNTER — Ambulatory Visit (INDEPENDENT_AMBULATORY_CARE_PROVIDER_SITE_OTHER): Payer: BC Managed Care – PPO | Admitting: Family Medicine

## 2010-07-14 ENCOUNTER — Encounter: Payer: Self-pay | Admitting: Family Medicine

## 2010-07-14 DIAGNOSIS — I10 Essential (primary) hypertension: Secondary | ICD-10-CM

## 2010-07-14 DIAGNOSIS — E236 Other disorders of pituitary gland: Secondary | ICD-10-CM

## 2010-07-14 DIAGNOSIS — D751 Secondary polycythemia: Secondary | ICD-10-CM

## 2010-07-14 LAB — CBC WITH DIFFERENTIAL/PLATELET
Basophils Absolute: 0 10*3/uL (ref 0.0–0.1)
Basophils Relative: 0.1 % (ref 0.0–3.0)
Eosinophils Relative: 0.9 % (ref 0.0–5.0)
Hemoglobin: 15 g/dL (ref 12.0–15.0)
Lymphocytes Relative: 25.1 % (ref 12.0–46.0)
Monocytes Relative: 8.6 % (ref 3.0–12.0)
Neutro Abs: 6.7 10*3/uL (ref 1.4–7.7)
RBC: 4.35 Mil/uL (ref 3.87–5.11)
WBC: 10.3 10*3/uL (ref 4.5–10.5)

## 2010-07-14 LAB — BASIC METABOLIC PANEL
BUN: 10 mg/dL (ref 6–23)
CO2: 29 mEq/L (ref 19–32)
Chloride: 96 mEq/L (ref 96–112)
Creatinine, Ser: 0.6 mg/dL (ref 0.4–1.2)
Glucose, Bld: 98 mg/dL (ref 70–99)

## 2010-07-21 NOTE — Assessment & Plan Note (Signed)
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 had vomiting, volume delpetion at the time as well.  Additional workup included    a serum cortisol level which came back within normal limits at 23.4     and TSH was also done and was within normal limits and her HIV test     was nonreactive.  A CT scan of her head was done which came back     negative and her medications were reviewed and none found to be the     cause of this.  Also previously take on HCTZ for similar issue.     AT LAST OV: 1 week ago.   She reports that she was feeling well until 1 month ago...feeling colder, feels trembly inside.  Very thirsty.  Only drinking 16-24 ounces a day.  leg cramps.   Mild cough in AMs, post nasl drip, mild shortness of breath ging up steps.. since PNA.  Has noted she urinates a lot more lately... always hasurinated a lot.  Last OV.Marland Kitchen UA neg, CXR clear.  WBC were slightly elevated but she was starting runny nose and cough.  Lisinopril.. has SIADH listed as rare SE. HAs been taking twice day regularly for last 2 -3 weeks.   We have stopped lisinopril as of 2/10. Dr. Clarene Duke has placed her on amlodipine 2.5 mg daily for BP control. BPs are borderline... she will discuss increasing with her cardiologist.  4 days after med change and with water restriction.. Na 133 on 2/14. Still feeling somewhat trembly...occ hot flashes. Rare night sweats....she is in the middle of post menopausal syndrome.  Problems Prior to Update: 1)  ? 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 1 Tab By Mouth Monthly 2)  Metoprolol Tartrate 25 Mg Tabs (Metoprolol Tartrate) .... Take One Tablet Two Times A Day 3)  Calcium-Carb 600 + D 600-125 Mg-Unit Tabs (Calcium Carbonate-Vitamin D) .... Two Times A Day 4)  Vitamin C Cr 500 Mg Cr-Caps (Ascorbic Acid) .... Once Daily 5)  Norvasc 2.5 Mg Tabs (Amlodipine Besylate) .... One Tablet Dialy  Allergies: 1)  ! * Zyban 2)  ! Astelin (Azelastine Hcl)  Past History:  Past medical, surgical, family and social histories (including risk factors) reviewed, and no changes noted (except as noted below).  Past Medical History: Reviewed history from 06/20/2009 and no changes required. Hyperlipidemia Diverticulitis Diverticulosis Hypertension MVP  Past Surgical History: Reviewed history from 08/13/2008 and no changes required. cardiac stress test 11/2007 low risk ECHO 11/2007  cryptherapy for cervical dyplasia x 2  Denies surgical history  Family History: Reviewed history from 06/20/2009 and no changes required. father: HTN, high cholesterol, DM mother: healthy siblings: high cholesterol, HTN PGM: DM OZH:YQMVH tumor No MI <age 2 no cancer known No FH of Colon Cancer:  Social History: Reviewed history from 11/01/2008 and no changes required. 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 CV:  Denies chest pain or discomfort. Resp:  Denies shortness of breath. GI:  Denies abdominal pain. GU:  Denies abnormal vaginal bleeding and dysuria. MS:  occ body ache in different areas... no specific or continuous.  occ intermittant right arm ache. Psych:  Denies anxiety and depression. Endo:  Denies cold intolerance, excessive hunger, excessive urination, polyuria, and weight change.  Physical Exam  General:  thin healthy appearing female in NAD Eyes:  No corneal or conjunctival inflammation noted. EOMI. Perrla. Funduscopic exam benign, without hemorrhages, exudates or papilledema. Vision grossly normal. Ears:  External ear exam shows no significant lesions or deformities.  Otoscopic examination reveals clear canals, tympanic membranes are intact bilaterally without bulging, retraction, inflammation or discharge. Hearing is grossly normal bilaterally. Nose:  External nasal examination shows no deformity or inflammation. Nasal mucosa are pink and moist without lesions or exudates. Mouth:  Oral mucosa and oropharynx without lesions or exudates.  Teeth in good repair. Neck:  no carotid bruit or thyromegaly no cervical or supraclavicular lymphadenopathy  Lungs:  Normal respiratory effort, chest expands symmetrically. Lungs are clear to auscultation, no crackles or wheezes. Heart:  Normal rate and regular rhythm. S1 and S2 normal without gallop, murmur, click, rub or other extra sounds. Abdomen:  Bowel sounds positive,abdomen soft and non-tender without masses, organomegaly or hernias noted. Msk:  No deformity or scoliosis noted of thoracic or lumbar spine.   No joint deformity or swelling throughout  Hand red.. but typical for her with Raynaud's Pulses:  R and L carotid,radial,femoral,dorsalis pedis and posterior  tibial pulses are full and equal bilaterally Extremities:  No clubbing, cyanosis, edema, or deformity noted with normal full range of motion of all joints.   Neurologic:  No cranial nerve deficits noted. Station and gait are normal. Plantar reflexes are down-going bilaterally. DTRs are symmetrical throughout. Sensory, motor and coordinative functions appear intact. 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  ? due to lisinopril... she is now off but is restricting water significantly. Will recheck today.  No specific sign of cancer... CXR showed no lung cancer.    WBC were also up at last check 2/14...no clear infection... congestion more allergies per pt. No specific inflamation. If persistantly low NA... refer to ENDO for further evval. If up... check NA again in 3-4 weeks after returning to normal water intake.   Orders: TLB-CBC Platelet - w/Differential (85025-CBCD) TLB-BMP (Basic Metabolic Panel-BMET) (80048-METABOL)  Problem # 2:  ? of POLYCYTHEMIA (ICD-289.0) If persistant .. consider referral to [facility] for further eval.   Problem # 3:  HYPERTENSION, BENIGN ESSENTIAL, LABILE (ICD-401.1) Liekly needs BP elevation... will leave recommendations for cardiologist. Pt will contact today.  The following medications were removed  from the medication list:    Lisinopril 20 Mg Tabs (Lisinopril) .Marland Kitchen..Marland Kitchen Two times a day Her updated medication list for this problem includes:    Metoprolol Tartrate 25 Mg Tabs (Metoprolol tartrate) .Marland Kitchen... Take one tablet two times a day    Norvasc 2.5 Mg Tabs (Amlodipine besylate) ..... One tablet dialy  Complete Medication List: 1)  Boniva 150 Mg Tabs (Ibandronate sodium) .Marland Kitchen.. 1 tab by mouth monthly 2)  Metoprolol Tartrate 25 Mg Tabs (Metoprolol tartrate) .... Take one tablet two times a day 3)  Calcium-carb 600 + D 600-125 Mg-unit Tabs (Calcium carbonate-vitamin d) .... Two  times a day 4)  Vitamin C Cr 500 Mg Cr-caps (Ascorbic acid) .... Once daily 5)  Norvasc 2.5 Mg Tabs (Amlodipine besylate) .... One tablet dialy   Orders Added: 1)  TLB-CBC Platelet - w/Differential [85025-CBCD] 2)  TLB-BMP (Basic Metabolic Panel-BMET) [80048-METABOL] 3)  Est. Patient Level IV [[address]]    Current Allergies (reviewed today): ! * ZYBAN ! ASTELIN (AZELASTINE HCL)

## 2010-08-04 ENCOUNTER — Other Ambulatory Visit (INDEPENDENT_AMBULATORY_CARE_PROVIDER_SITE_OTHER): Payer: BC Managed Care – PPO

## 2010-08-04 ENCOUNTER — Telehealth (INDEPENDENT_AMBULATORY_CARE_PROVIDER_SITE_OTHER): Payer: Self-pay | Admitting: *Deleted

## 2010-08-04 ENCOUNTER — Other Ambulatory Visit: Payer: Self-pay | Admitting: Family Medicine

## 2010-08-04 ENCOUNTER — Encounter (INDEPENDENT_AMBULATORY_CARE_PROVIDER_SITE_OTHER): Payer: Self-pay | Admitting: *Deleted

## 2010-08-04 DIAGNOSIS — D751 Secondary polycythemia: Secondary | ICD-10-CM

## 2010-08-04 DIAGNOSIS — E236 Other disorders of pituitary gland: Secondary | ICD-10-CM

## 2010-08-04 LAB — CBC WITH DIFFERENTIAL/PLATELET
Basophils Relative: 0.3 % (ref 0.0–3.0)
Eosinophils Absolute: 0.1 10*3/uL (ref 0.0–0.7)
MCHC: 34.1 g/dL (ref 30.0–36.0)
MCV: 101 fl — ABNORMAL HIGH (ref 78.0–100.0)
Monocytes Absolute: 0.8 10*3/uL (ref 0.1–1.0)
Neutrophils Relative %: 57.6 % (ref 43.0–77.0)
RBC: 4.03 Mil/uL (ref 3.87–5.11)

## 2010-08-04 LAB — BASIC METABOLIC PANEL
BUN: 9 mg/dL (ref 6–23)
Creatinine, Ser: 0.8 mg/dL (ref 0.4–1.2)
GFR: 84.02 mL/min (ref >60.00)
Potassium: 4.5 mEq/L (ref 3.5–5.1)

## 2010-08-05 LAB — BASIC METABOLIC PANEL
BUN: 3 mg/dL — ABNORMAL LOW (ref 6–23)
BUN: 3 mg/dL — ABNORMAL LOW (ref 6–23)
BUN: 3 mg/dL — ABNORMAL LOW (ref 6–23)
BUN: 3 mg/dL — ABNORMAL LOW (ref 6–23)
BUN: 4 mg/dL — ABNORMAL LOW (ref 6–23)
BUN: 4 mg/dL — ABNORMAL LOW (ref 6–23)
BUN: 4 mg/dL — ABNORMAL LOW (ref 6–23)
BUN: 6 mg/dL (ref 6–23)
CO2: 22 mEq/L (ref 19–32)
CO2: 24 mEq/L (ref 19–32)
CO2: 24 mEq/L (ref 19–32)
CO2: 25 mEq/L (ref 19–32)
CO2: 25 mEq/L (ref 19–32)
CO2: 26 mEq/L (ref 19–32)
CO2: 27 mEq/L (ref 19–32)
Calcium: 7.8 mg/dL — ABNORMAL LOW (ref 8.4–10.5)
Calcium: 8 mg/dL — ABNORMAL LOW (ref 8.4–10.5)
Calcium: 8.1 mg/dL — ABNORMAL LOW (ref 8.4–10.5)
Calcium: 8.1 mg/dL — ABNORMAL LOW (ref 8.4–10.5)
Calcium: 8.1 mg/dL — ABNORMAL LOW (ref 8.4–10.5)
Calcium: 8.2 mg/dL — ABNORMAL LOW (ref 8.4–10.5)
Calcium: 8.2 mg/dL — ABNORMAL LOW (ref 8.4–10.5)
Calcium: 8.3 mg/dL — ABNORMAL LOW (ref 8.4–10.5)
Calcium: 8.3 mg/dL — ABNORMAL LOW (ref 8.4–10.5)
Calcium: 8.4 mg/dL (ref 8.4–10.5)
Calcium: 8.4 mg/dL (ref 8.4–10.5)
Calcium: 8.6 mg/dL (ref 8.4–10.5)
Chloride: 76 mEq/L — ABNORMAL LOW (ref 96–112)
Chloride: 82 mEq/L — ABNORMAL LOW (ref 96–112)
Chloride: 89 mEq/L — ABNORMAL LOW (ref 96–112)
Chloride: 89 mEq/L — ABNORMAL LOW (ref 96–112)
Chloride: 91 mEq/L — ABNORMAL LOW (ref 96–112)
Chloride: 91 mEq/L — ABNORMAL LOW (ref 96–112)
Chloride: 93 mEq/L — ABNORMAL LOW (ref 96–112)
Chloride: 95 mEq/L — ABNORMAL LOW (ref 96–112)
Creatinine, Ser: 0.51 mg/dL (ref 0.4–1.2)
Creatinine, Ser: 0.53 mg/dL (ref 0.4–1.2)
Creatinine, Ser: 0.6 mg/dL (ref 0.4–1.2)
Creatinine, Ser: 0.62 mg/dL (ref 0.4–1.2)
Creatinine, Ser: 0.62 mg/dL (ref 0.4–1.2)
Creatinine, Ser: 0.63 mg/dL (ref 0.4–1.2)
Creatinine, Ser: 0.65 mg/dL (ref 0.4–1.2)
Creatinine, Ser: 0.66 mg/dL (ref 0.4–1.2)
Creatinine, Ser: 0.69 mg/dL (ref 0.4–1.2)
GFR calc Af Amer: >60 mL/min (ref >60)
GFR calc Af Amer: >60 mL/min (ref >60)
GFR calc Af Amer: >60 mL/min (ref >60)
GFR calc Af Amer: >60 mL/min (ref >60)
GFR calc Af Amer: >60 mL/min (ref >60)
GFR calc Af Amer: >60 mL/min (ref >60)
GFR calc Af Amer: >60 mL/min (ref >60)
GFR calc Af Amer: >60 mL/min (ref >60)
GFR calc non Af Amer: >60 mL/min (ref >60)
GFR calc non Af Amer: >60 mL/min (ref >60)
GFR calc non Af Amer: >60 mL/min (ref >60)
GFR calc non Af Amer: >60 mL/min (ref >60)
GFR calc non Af Amer: >60 mL/min (ref >60)
GFR calc non Af Amer: >60 mL/min (ref >60)
GFR calc non Af Amer: >60 mL/min (ref >60)
GFR calc non Af Amer: >60 mL/min (ref >60)
GFR calc non Af Amer: >60 mL/min (ref >60)
GFR calc non Af Amer: >60 mL/min (ref >60)
Glucose, Bld: 106 mg/dL — ABNORMAL HIGH (ref 70–99)
Glucose, Bld: 110 mg/dL — ABNORMAL HIGH (ref 70–99)
Glucose, Bld: 112 mg/dL — ABNORMAL HIGH (ref 70–99)
Glucose, Bld: 125 mg/dL — ABNORMAL HIGH (ref 70–99)
Glucose, Bld: 126 mg/dL — ABNORMAL HIGH (ref 70–99)
Glucose, Bld: 129 mg/dL — ABNORMAL HIGH (ref 70–99)
Glucose, Bld: 132 mg/dL — ABNORMAL HIGH (ref 70–99)
Potassium: 2.7 mEq/L — CL (ref 3.5–5.1)
Potassium: 2.9 mEq/L — ABNORMAL LOW (ref 3.5–5.1)
Potassium: 3 mEq/L — ABNORMAL LOW (ref 3.5–5.1)
Potassium: 3.1 mEq/L — ABNORMAL LOW (ref 3.5–5.1)
Potassium: 3.3 mEq/L — ABNORMAL LOW (ref 3.5–5.1)
Potassium: 3.5 mEq/L (ref 3.5–5.1)
Potassium: 3.6 mEq/L (ref 3.5–5.1)
Potassium: 3.7 mEq/L (ref 3.5–5.1)
Potassium: 3.8 mEq/L (ref 3.5–5.1)
Potassium: 4.2 mEq/L (ref 3.5–5.1)
Sodium: 113 mEq/L — CL (ref 135–145)
Sodium: 113 mEq/L — CL (ref 135–145)
Sodium: 120 mEq/L — ABNORMAL LOW (ref 135–145)
Sodium: 120 mEq/L — ABNORMAL LOW (ref 135–145)
Sodium: 120 mEq/L — ABNORMAL LOW (ref 135–145)
Sodium: 121 mEq/L — ABNORMAL LOW (ref 135–145)
Sodium: 122 mEq/L — ABNORMAL LOW (ref 135–145)
Sodium: 124 mEq/L — ABNORMAL LOW (ref 135–145)
Sodium: 125 mEq/L — ABNORMAL LOW (ref 135–145)
Sodium: 127 mEq/L — ABNORMAL LOW (ref 135–145)

## 2010-08-05 LAB — COMPREHENSIVE METABOLIC PANEL
AST: 62 U/L — ABNORMAL HIGH (ref 0–37)
Albumin: 3.3 g/dL — ABNORMAL LOW (ref 3.5–5.2)
Calcium: 8.8 mg/dL (ref 8.4–10.5)
Chloride: 72 mEq/L — ABNORMAL LOW (ref 96–112)
Creatinine, Ser: 0.57 mg/dL (ref 0.4–1.2)
GFR calc Af Amer: >60 mL/min (ref >60)
Total Protein: 6.2 g/dL (ref 6.0–8.3)

## 2010-08-05 LAB — CBC
HCT: 31.6 % — ABNORMAL LOW (ref 36.0–46.0)
HCT: 37.7 % (ref 36.0–46.0)
Hemoglobin: 11.2 g/dL — ABNORMAL LOW (ref 12.0–15.0)
Hemoglobin: 13.6 g/dL (ref 12.0–15.0)
Hemoglobin: 13.7 g/dL (ref 12.0–15.0)
Hemoglobin: 13.8 g/dL (ref 12.0–15.0)
MCH: 33.3 pg (ref 26.0–34.0)
MCH: 33.5 pg (ref 26.0–34.0)
MCHC: 35.4 g/dL (ref 30.0–36.0)
MCHC: 36.6 g/dL — ABNORMAL HIGH (ref 30.0–36.0)
MCHC: 37.1 g/dL — ABNORMAL HIGH (ref 30.0–36.0)
MCV: 94 fL (ref 78.0–100.0)
Platelets: 246 10*3/uL (ref 150–400)
RBC: 4.01 MIL/uL (ref 3.87–5.11)
RBC: 4.09 MIL/uL (ref 3.87–5.11)
WBC: 11 10*3/uL — ABNORMAL HIGH (ref 4.0–10.5)

## 2010-08-05 LAB — DIFFERENTIAL
Basophils Relative: 0 % (ref 0–1)
Basophils Relative: 0 % (ref 0–1)
Eosinophils Absolute: 0 10*3/uL (ref 0.0–0.7)
Eosinophils Absolute: 0 10*3/uL (ref 0.0–0.7)
Eosinophils Relative: 0 % (ref 0–5)
Lymphs Abs: 2 10*3/uL (ref 0.7–4.0)
Lymphs Abs: 2.3 10*3/uL (ref 0.7–4.0)
Monocytes Absolute: 2.3 10*3/uL — ABNORMAL HIGH (ref 0.1–1.0)
Monocytes Relative: 10 % (ref 3–12)
Neutro Abs: 15.3 10*3/uL — ABNORMAL HIGH (ref 1.7–7.7)
Neutro Abs: 18.1 10*3/uL — ABNORMAL HIGH (ref 1.7–7.7)
Neutrophils Relative %: 80 % — ABNORMAL HIGH (ref 43–77)
Neutrophils Relative %: 80 % — ABNORMAL HIGH (ref 43–77)

## 2010-08-05 LAB — URINALYSIS, ROUTINE W REFLEX MICROSCOPIC
Bilirubin Urine: NEGATIVE
Specific Gravity, Urine: 1.013 (ref 1.005–1.030)
pH: 6.5 (ref 5.0–8.0)

## 2010-08-05 LAB — URINE MICROSCOPIC-ADD ON

## 2010-08-05 LAB — SODIUM, URINE, RANDOM: Sodium, Ur: 84 mEq/L

## 2010-08-05 LAB — URINE CULTURE: Culture  Setup Time: 201110201643

## 2010-08-05 LAB — OSMOLALITY, URINE: Osmolality, Ur: 199 mOsm/kg — ABNORMAL LOW (ref 390–1090)

## 2010-08-05 LAB — CORTISOL: Cortisol, Plasma: 23.4 ug/dL

## 2010-08-05 LAB — URIC ACID: Uric Acid, Serum: 2.9 mg/dL (ref 2.4–7.0)

## 2010-08-05 LAB — TSH: TSH: 2.315 u[IU]/mL (ref 0.350–4.500)

## 2010-08-11 ENCOUNTER — Ambulatory Visit: Payer: BC Managed Care – PPO | Admitting: Hematology & Oncology

## 2010-08-11 NOTE — Progress Notes (Signed)
----   Converted from flag ---- ---- 08/04/2010 8:23 AM, Liane Comber CMA (AAMA) wrote:   ---- 08/04/2010 8:18 AM, Kerby Nora MD wrote: yes   ---- 08/04/2010 7:52 AM, Liane Comber CMA (AAMA) wrote: Pt wants to know if a cbc can be added to her labs today, due to previous low wbc.   Thanks Tasha ------------------------------

## 2010-08-19 LAB — BASIC METABOLIC PANEL: Sodium: 134 mmol/L — AB (ref 137–147)

## 2010-09-09 ENCOUNTER — Encounter (HOSPITAL_BASED_OUTPATIENT_CLINIC_OR_DEPARTMENT_OTHER): Payer: BC Managed Care – PPO | Admitting: Hematology & Oncology

## 2010-09-09 ENCOUNTER — Other Ambulatory Visit: Payer: Self-pay | Admitting: Hematology & Oncology

## 2010-09-09 DIAGNOSIS — D45 Polycythemia vera: Secondary | ICD-10-CM

## 2010-09-09 DIAGNOSIS — E871 Hypo-osmolality and hyponatremia: Secondary | ICD-10-CM

## 2010-09-09 LAB — CBC WITH DIFFERENTIAL (CANCER CENTER ONLY)
BASO%: 0.1 % (ref 0.0–2.0)
Eosinophils Absolute: 0.1 10*3/uL (ref 0.0–0.5)
LYMPH#: 2.4 10*3/uL (ref 0.9–3.3)
LYMPH%: 28.2 % (ref 14.0–48.0)
MCV: 97 fL (ref 81–101)
MONO#: 0.8 10*3/uL (ref 0.1–0.9)
Platelets: 202 10*3/uL (ref 145–400)
RBC: 4.07 10*6/uL (ref 3.70–5.32)
RDW: 12.7 % (ref 11.1–15.7)
WBC: 8.7 10*3/uL (ref 3.9–10.0)

## 2010-09-09 LAB — CHCC SATELLITE - SMEAR

## 2010-09-10 LAB — RETICULOCYTES (CHCC)
ABS Retic: 62.1 10*3/uL (ref 19.0–186.0)
RBC.: 4.14 MIL/uL (ref 3.87–5.11)
Retic Ct Pct: 1.5 % (ref 0.4–3.1)

## 2010-09-10 LAB — IRON AND TIBC: TIBC: 331 ug/dL (ref 250–470)

## 2010-09-10 LAB — LACTATE DEHYDROGENASE: LDH: 131 U/L (ref 94–250)

## 2010-09-10 LAB — ERYTHROPOIETIN: Erythropoietin: 13.7 m[IU]/mL (ref 2.6–34.0)

## 2010-09-14 LAB — JAK-2 V617F

## 2010-10-02 ENCOUNTER — Telehealth: Payer: Self-pay | Admitting: Family Medicine

## 2010-10-02 DIAGNOSIS — E236 Other disorders of pituitary gland: Secondary | ICD-10-CM

## 2010-10-02 NOTE — Telephone Encounter (Signed)
Patient called me to say that she was not happy seeing Dr Talmage Nap. She seems to be taking a wait and see approach and only doing labs on her so far and not trying to get to the bottom of the problem. She wants a second opinion to Dr Talmage Coin who Dr Myna Hidalgo recommended. She is not to see Dr Talmage Nap again for 2 more months and would [place] to help her with the appt. I will try to get the labs from Dr Talmage Nap and start the referral process. She wanted you to know that she really liked Dr Myna Hidalgo and so far her labs are all good that he drew.

## 2010-10-14 ENCOUNTER — Encounter: Payer: Self-pay | Admitting: Family Medicine

## 2010-10-16 ENCOUNTER — Encounter: Payer: Self-pay | Admitting: Family Medicine

## 2010-11-09 ENCOUNTER — Encounter: Payer: Self-pay | Admitting: Family Medicine

## 2010-11-18 ENCOUNTER — Telehealth: Payer: Self-pay | Admitting: Family Medicine

## 2010-11-18 DIAGNOSIS — I10 Essential (primary) hypertension: Secondary | ICD-10-CM

## 2010-11-18 DIAGNOSIS — E785 Hyperlipidemia, unspecified: Secondary | ICD-10-CM

## 2010-11-18 NOTE — Telephone Encounter (Signed)
Message copied by Excell Seltzer on Wed Nov 18, 2010  4:26 PM ------      Message from: Baldomero Lamy      Created: Tue Nov 17, 2010  7:08 AM      Regarding: Cpx labs Thurs       Please order  future cpx labs for pt's upcomming lab appt.      Thanks      Rodney Booze

## 2010-11-19 ENCOUNTER — Other Ambulatory Visit (INDEPENDENT_AMBULATORY_CARE_PROVIDER_SITE_OTHER): Payer: BC Managed Care – PPO | Admitting: Family Medicine

## 2010-11-19 DIAGNOSIS — E785 Hyperlipidemia, unspecified: Secondary | ICD-10-CM

## 2010-11-19 DIAGNOSIS — I10 Essential (primary) hypertension: Secondary | ICD-10-CM

## 2010-11-19 LAB — COMPREHENSIVE METABOLIC PANEL
ALT: 12 U/L (ref 0–35)
AST: 19 U/L (ref 0–37)
Albumin: 4.1 g/dL (ref 3.5–5.2)
Alkaline Phosphatase: 45 U/L (ref 39–117)
Glucose, Bld: 82 mg/dL (ref 70–99)
Potassium: 4.4 mEq/L (ref 3.5–5.1)
Sodium: 134 mEq/L — ABNORMAL LOW (ref 135–145)
Total Bilirubin: 0.8 mg/dL (ref 0.3–1.2)
Total Protein: 6.6 g/dL (ref 6.0–8.3)

## 2010-11-19 LAB — CBC WITH DIFFERENTIAL/PLATELET
Basophils Absolute: 0 10*3/uL (ref 0.0–0.1)
Eosinophils Relative: 1.7 % (ref 0.0–5.0)
HCT: 37.5 % (ref 36.0–46.0)
Hemoglobin: 12.8 g/dL (ref 12.0–15.0)
Lymphocytes Relative: 31.5 % (ref 12.0–46.0)
Lymphs Abs: 2.2 10*3/uL (ref 0.7–4.0)
Monocytes Relative: 9.4 % (ref 3.0–12.0)
Neutro Abs: 4 10*3/uL (ref 1.4–7.7)
RBC: 3.64 Mil/uL — ABNORMAL LOW (ref 3.87–5.11)
WBC: 7 10*3/uL (ref 4.5–10.5)

## 2010-11-19 LAB — LIPID PANEL
Total CHOL/HDL Ratio: 2
VLDL: 6.4 mg/dL (ref 0.0–40.0)

## 2010-11-24 ENCOUNTER — Encounter: Payer: Self-pay | Admitting: Family Medicine

## 2010-11-24 ENCOUNTER — Ambulatory Visit (INDEPENDENT_AMBULATORY_CARE_PROVIDER_SITE_OTHER): Payer: BC Managed Care – PPO | Admitting: Family Medicine

## 2010-11-24 DIAGNOSIS — D489 Neoplasm of uncertain behavior, unspecified: Secondary | ICD-10-CM

## 2010-11-24 DIAGNOSIS — E236 Other disorders of pituitary gland: Secondary | ICD-10-CM

## 2010-11-24 DIAGNOSIS — I10 Essential (primary) hypertension: Secondary | ICD-10-CM

## 2010-11-24 DIAGNOSIS — E785 Hyperlipidemia, unspecified: Secondary | ICD-10-CM

## 2010-11-24 DIAGNOSIS — Z0000 Encounter for general adult medical examination without abnormal findings: Secondary | ICD-10-CM

## 2010-11-24 DIAGNOSIS — Z01419 Encounter for gynecological examination (general) (routine) without abnormal findings: Secondary | ICD-10-CM

## 2010-11-24 DIAGNOSIS — M818 Other osteoporosis without current pathological fracture: Secondary | ICD-10-CM

## 2010-11-24 DIAGNOSIS — R7309 Other abnormal glucose: Secondary | ICD-10-CM

## 2010-11-24 NOTE — Assessment & Plan Note (Signed)
Well controlled. Continue current medication.  

## 2010-11-24 NOTE — Assessment & Plan Note (Signed)
Per ENDO. She has second opionion appt coming up.

## 2010-11-24 NOTE — Assessment & Plan Note (Signed)
Well controlled 

## 2010-11-24 NOTE — Assessment & Plan Note (Signed)
Resolved

## 2010-11-24 NOTE — Patient Instructions (Signed)
Stop at front desk to set up referral to Derm. Follow up in 1 year for CPX. We will follow along with endocrinologists findings, please have them send their notes.

## 2010-11-24 NOTE — Progress Notes (Signed)
Subjective:    Patient ID: Candice Gray, female    DOB: [birth year], 55 y.o.   MRN: [id number]  HPI  55 year old female  The patient is here for annual wellness exam and preventative care.    Hypertension: Well controlled on occ amlodipine and daily  toprol. Followed by Dr. Clarene Duke Using medication without problems or lightheadedness:  Chest pain with exertion: None Edema:None Short of breath:None Average home BMW:UXLK controlled. Other issues:None  Elevated Cholesterol: LDL at goal <130, and HDL good, high.  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 Recommending restriction of fluid and salt. She is looking into a second opinion..will see Dr. Sharl Ma.  Seeing Dr. Jeanella Craze felt evaluation for elevated MCV was negative, per Dr. Bea Laura under microscope rbcs appear normal.  Osteoporosis: On boniva since 2011, this is when last DEXa done. On ca supplement.  Has mole on left neck, not irritated, occ itchy. Dr. Myna Hidalgo felt it looked suspicious.     Review of Systems  Constitutional: Negative for fever and fatigue.  HENT: Negative for ear pain.   Eyes: Negative for pain.  Respiratory: Negative for chest tightness and shortness of breath.   Cardiovascular: Negative for chest pain, palpitations and leg swelling.  Gastrointestinal: Negative for abdominal pain.  Genitourinary: Negative for dysuria.       Objective:   Physical Exam  Constitutional: Vital signs are normal. She appears well-developed and well-nourished. She is cooperative.  Non-toxic appearance. She does not appear ill. No distress.  HENT:  Head: Normocephalic.  Right Ear: Hearing, tympanic membrane, external ear and ear canal normal.  Left Ear: Hearing, tympanic membrane, external ear and ear canal normal.  Nose: Nose normal.  Eyes: Conjunctivae, EOM and lids are normal. Pupils are equal, round, and reactive to light. No foreign bodies found.  Neck: Trachea normal and normal range of motion. Neck supple.  Carotid bruit is not present. No mass and no thyromegaly present.  Cardiovascular: Normal rate, regular rhythm, S1 normal, S2 normal, normal heart sounds and intact distal pulses.  Exam reveals no gallop.   No murmur heard. Pulmonary/Chest: Effort normal and breath sounds normal. No respiratory distress. She has no wheezes. She has no rhonchi. She has no rales.  Abdominal: Soft. Normal appearance and bowel sounds are normal. She exhibits no distension, no fluid wave, no abdominal bruit and no mass. There is no hepatosplenomegaly. There is no tenderness. There is no rebound, no guarding and no CVA tenderness. No hernia.  Genitourinary: Vagina normal and uterus normal. No breast swelling, tenderness, discharge or bleeding. Pelvic exam was performed with patient prone. There is no rash, tenderness or lesion on the right labia. There is no rash, tenderness or lesion on the left labia. Uterus is not enlarged and not tender. Cervix exhibits no motion tenderness, no discharge and no friability. Right adnexum displays no mass, no tenderness and no fullness. Left adnexum displays no mass, no tenderness and no fullness.       No pap performed, check every 2 years  Lymphadenopathy:    She has no cervical adenopathy.    She has no axillary adenopathy.  Neurological: She is alert. She has normal strength. No cranial nerve deficit or sensory deficit.  Skin: Skin is warm, dry and intact. No rash noted.  Psychiatric: Her speech is normal and behavior is normal. Judgment normal. Her mood appears not anxious. Cognition and memory are normal. She does not exhibit a depressed mood.  Assessment & Plan:  Complete Physical Exam: The patient's preventative maintenance and recommended screening tests for an annual wellness exam were reviewed in full today. Brought up to date unless services declined.  Counselled on the importance of diet, exercise, and its role in overall health and mortality. The patient's  FH and SH was reviewed, including their home life, tobacco status, and drug and alcohol status.   Last DEXA 2011 Last colon: 2011.Marland Kitchen No repeat screen in this way given small caliber colon. Further eval per Dr. Juanda Chance. UTD with vaccines. MAmmo nml  Pap q 2 years, not this year.

## 2010-12-17 ENCOUNTER — Other Ambulatory Visit: Payer: Self-pay | Admitting: Dermatology

## 2011-03-22 ENCOUNTER — Other Ambulatory Visit: Payer: Self-pay | Admitting: *Deleted

## 2011-03-22 MED ORDER — IBANDRONATE SODIUM 150 MG PO TABS: 150.0000 mg | ORAL_TABLET | ORAL | Status: DC

## 2011-12-29 ENCOUNTER — Telehealth: Payer: Self-pay | Admitting: Family Medicine

## 2011-12-29 DIAGNOSIS — E785 Hyperlipidemia, unspecified: Secondary | ICD-10-CM

## 2011-12-29 DIAGNOSIS — M818 Other osteoporosis without current pathological fracture: Secondary | ICD-10-CM

## 2011-12-29 NOTE — Telephone Encounter (Signed)
Message copied by Excell Seltzer on Wed Dec 29, 2011 10:06 PM ------      Message from: Alvina Chou      Created: Fri Dec 24, 2011 10:24 AM      Regarding: Lab orders for Friday, 8.9.13       Patient is scheduled for CPX labs, please order future labs, Thanks , Camelia Eng

## 2011-12-31 ENCOUNTER — Encounter: Payer: Self-pay | Admitting: Family Medicine

## 2011-12-31 ENCOUNTER — Other Ambulatory Visit (INDEPENDENT_AMBULATORY_CARE_PROVIDER_SITE_OTHER): Payer: BC Managed Care – PPO

## 2011-12-31 DIAGNOSIS — M818 Other osteoporosis without current pathological fracture: Secondary | ICD-10-CM

## 2011-12-31 DIAGNOSIS — E785 Hyperlipidemia, unspecified: Secondary | ICD-10-CM

## 2011-12-31 LAB — COMPREHENSIVE METABOLIC PANEL
CO2: 29 mEq/L (ref 19–32)
Creatinine, Ser: 0.7 mg/dL (ref 0.4–1.2)
GFR: 98.37 mL/min (ref >60.00)
Glucose, Bld: 84 mg/dL (ref 70–99)
Total Bilirubin: 0.6 mg/dL (ref 0.3–1.2)

## 2011-12-31 LAB — LDL CHOLESTEROL, DIRECT: Direct LDL: 74.6 mg/dL

## 2011-12-31 LAB — LIPID PANEL
Cholesterol: 220 mg/dL — ABNORMAL HIGH (ref 0–200)
HDL: 137.7 mg/dL (ref >39.00)
Triglycerides: 63 mg/dL (ref 0.0–149.0)
VLDL: 12.6 mg/dL (ref 0.0–40.0)

## 2012-01-03 ENCOUNTER — Encounter: Payer: Self-pay | Admitting: *Deleted

## 2012-01-03 ENCOUNTER — Encounter: Payer: Self-pay | Admitting: Family Medicine

## 2012-01-04 ENCOUNTER — Encounter: Payer: Self-pay | Admitting: Family Medicine

## 2012-01-04 ENCOUNTER — Other Ambulatory Visit (HOSPITAL_COMMUNITY)
Admission: RE | Admit: 2012-01-04 | Discharge: 2012-01-04 | Disposition: A | Discharge status: Home / Self Care | Payer: BC Managed Care – PPO | Source: Ambulatory Visit | Attending: Family Medicine | Admitting: Family Medicine

## 2012-01-04 ENCOUNTER — Ambulatory Visit (INDEPENDENT_AMBULATORY_CARE_PROVIDER_SITE_OTHER): Payer: BC Managed Care – PPO | Admitting: Family Medicine

## 2012-01-04 VITALS — BP 122/84 | HR 88 | Temp 98.1°F | Ht 69.0 in | Wt 124.0 lb

## 2012-01-04 DIAGNOSIS — E236 Other disorders of pituitary gland: Secondary | ICD-10-CM

## 2012-01-04 DIAGNOSIS — Z01419 Encounter for gynecological examination (general) (routine) without abnormal findings: Secondary | ICD-10-CM

## 2012-01-04 DIAGNOSIS — E785 Hyperlipidemia, unspecified: Secondary | ICD-10-CM

## 2012-01-04 DIAGNOSIS — Z0000 Encounter for general adult medical examination without abnormal findings: Secondary | ICD-10-CM

## 2012-01-04 DIAGNOSIS — M810 Age-related osteoporosis without current pathological fracture: Secondary | ICD-10-CM

## 2012-01-04 DIAGNOSIS — R7309 Other abnormal glucose: Secondary | ICD-10-CM

## 2012-01-04 DIAGNOSIS — I10 Essential (primary) hypertension: Secondary | ICD-10-CM

## 2012-01-04 NOTE — Assessment & Plan Note (Signed)
Resolved

## 2012-01-04 NOTE — Progress Notes (Signed)
56 year old female  The patient is here for annual wellness exam and preventative care.   Hypertension: Well controlled on occ amlodipine and daily toprol. Followed by Dr. Clarene Duke  Using medication without problems or lightheadedness:  Chest pain with exertion: None  Edema:None  Short of breath:None  Average home YQM:VHQI controlled.  Other issues:None   Elevated Cholesterol: LDL at goal <130, and HDL good, high. Lab Results  Component Value Date   CHOL 220* 12/31/2011   HDL 137.70 12/31/2011   LDLCALC 85 12/17/2008   LDLDIRECT 74.6 12/31/2011   TRIG 63.0 12/31/2011   CHOLHDL 2 12/31/2011     Prediabetes: resolved   SIADH: Last OV in 08/2011 with Dr. Sharl Ma, ENDO.  Reviewed note in detail. Having her continue fluid restriction.  Seeing Dr. Jeanella Craze felt evaluation for elevated MCV was negative, per Dr. Bea Laura under microscope rbcs appear normal.   Osteoporosis: On boniva since 2011, this is when last DEXA done. On ca supplement.   Has been under a lot of stress lately. Father in law is ill.  Review of Systems  Constitutional: Negative for fever and fatigue.  HENT: Negative for ear pain.  Eyes: Negative for pain.  Respiratory: Negative for chest tightness and shortness of breath.  Cardiovascular: Negative for chest pain, palpitations and leg swelling.  Gastrointestinal: Negative for abdominal pain.  Genitourinary: Negative for dysuria.    Objective:   Physical Exam  Constitutional: Vital signs are normal. She appears well-developed and well-nourished. She is cooperative. Non-toxic appearance. She does not appear ill. No distress.  HENT:  Poor dentition, dental caries and discoloration. Head: Normocephalic.  Right Ear: Hearing, tympanic membrane, external ear and ear canal normal.  Left Ear: Hearing, tympanic membrane, external ear and ear canal normal.  Nose: Nose normal.  Eyes: Conjunctivae, EOM and lids are normal. Pupils are equal, round, and reactive to light. No foreign bodies  found.  Neck: Trachea normal and normal range of motion. Neck supple. Carotid bruit is not present. No mass and no thyromegaly present.  Cardiovascular: Normal rate, regular rhythm, S1 normal, S2 normal, normal heart sounds and intact distal pulses. Exam reveals no gallop.  No murmur heard.  Pulmonary/Chest: Effort normal and breath sounds normal. No respiratory distress. She has no wheezes. She has no rhonchi. She has no rales.  Abdominal: Soft. Normal appearance and bowel sounds are normal. She exhibits no distension, no fluid wave, no abdominal bruit and no mass. There is no hepatosplenomegaly. There is no tenderness. There is no rebound, no guarding and no CVA tenderness. No hernia.  Genitourinary: Vagina normal and uterus normal. No breast swelling, tenderness, discharge or bleeding. Pelvic exam was performed with patient prone. There is no rash, tenderness or lesion on the right labia. There is no rash, tenderness or lesion on the left labia. Uterus is not enlarged and not tender. Cervix exhibits no motion tenderness, no discharge and no friability. Right adnexum displays no mass, no tenderness and no fullness. Left adnexum displays no mass, no tenderness and no fullness. PAP performed. Nml cervix, no lesions. Lymphadenopathy:  She has no cervical adenopathy.  She has no axillary adenopathy.  Neurological: She is alert. She has normal strength. No cranial nerve deficit or sensory deficit.  Skin: Skin is warm, dry and intact. No rash noted.  Psychiatric: Her speech is normal and behavior is normal. Judgment normal. Her mood appears not anxious. Cognition and memory are normal. She does not exhibit a depressed mood.    Assessment &  Plan:   Complete Physical Exam: The patient's preventative maintenance and recommended screening tests for an annual wellness exam were reviewed in full today.  Brought up to date unless services declined.  Counselled on the importance of diet, exercise, and its role  in overall health and mortality.  The patient's FH and SH was reviewed, including their home life, tobacco status, and drug and alcohol status.   Last DEXA 2011, Due for repeat Last colon: 2011.Marland Kitchen No repeat screen in this way given small caliber colon. Further eval per Dr. Juanda Chance.  UTD with vaccines, Td Mammo nml 12/2011 Pap q 2 years, due this year with DVE.  Counseled against marijuana use.

## 2012-01-04 NOTE — Assessment & Plan Note (Signed)
Well controlled on no medication. Continue healthy diet and exercise.

## 2012-01-04 NOTE — Patient Instructions (Addendum)
Stop at front desk to schedule bone density.  Work on [employer], stress reduction and continue exercise.

## 2012-01-04 NOTE — Assessment & Plan Note (Signed)
Well controlled. Continue current medication.  

## 2012-01-12 ENCOUNTER — Telehealth: Payer: Self-pay | Admitting: Family Medicine

## 2012-01-12 NOTE — Telephone Encounter (Signed)
Caller: Candice Gray/Patient; Patient Name: Candice Gray; PCP: Excell Seltzer.; Best Callback Phone Number: [phone] Caller states she is currently in [place] , [place]. and reports burning with urination . Onset: 01/11/12.  Urine frequency with blood in urine present. Mild pelvic pain present. Denies fever. Denies back pain. All emergent symptoms ruled out per Bloody Urine with exception "  urinary tract symtpoms and any back/flank , low back or lower abdominal pain or genital area pain".  Advsied caller to be seen at urgent care where she is now located with in next 4 hours. States she cannot because she is the only one there with her father who is [facility]. States she is returning home tomorrow (08/22) and will either  call office for possible appointment or go to urgent care then.

## 2012-01-13 ENCOUNTER — Encounter: Payer: Self-pay | Admitting: Family Medicine

## 2012-01-13 ENCOUNTER — Ambulatory Visit (INDEPENDENT_AMBULATORY_CARE_PROVIDER_SITE_OTHER): Payer: BC Managed Care – PPO | Admitting: Family Medicine

## 2012-01-13 VITALS — BP 144/100 | HR 88 | Temp 98.2°F | Wt 122.0 lb

## 2012-01-13 DIAGNOSIS — R319 Hematuria, unspecified: Secondary | ICD-10-CM

## 2012-01-13 DIAGNOSIS — N12 Tubulo-interstitial nephritis, not specified as acute or chronic: Secondary | ICD-10-CM

## 2012-01-13 LAB — POCT URINALYSIS DIPSTICK
Bilirubin, UA: NEGATIVE
Glucose, UA: 1000
Spec Grav, UA: 1.015
Urobilinogen, UA: NEGATIVE

## 2012-01-13 MED ORDER — CIPROFLOXACIN HCL 500 MG PO TABS: 500.0000 mg | ORAL_TABLET | Freq: Two times a day (BID) | ORAL | Status: AC

## 2012-01-13 NOTE — Progress Notes (Signed)
Nature conservation officer at [facility] [address] [place] [address] Phone: [phone] Fax: [phone]  Date:  01/13/2012   Name:  Candice Gray   DOB:  05-09-1956   MRN:  [id number] Gender: female  Age: 56 y.o.  PCP:  Kerby Nora, MD    Chief Complaint: No chief complaint on file.   History of Present Illness:  Candice Gray is a 56 y.o. very pleasant female patient who presents with the following:  Called yesterday and was in [place]. Feeling bad, feverish, and not feeling that well. Had some burning when urinating. Not sure urinary or vaginal bleeding. Checkedtemperature and felt bad and really felt like could not eat. Temp was high last night and was sweating and had chills. In good shape. Driving a different car for two hours back and forth.  Dysuria, frequency. Hematuria. Unclear if this is of vaginal or urinary source. She felt quite bad, fevers, clammy and not well. Started yesterday, she has been getting worse.   Past Medical History, Surgical History, Social History, Family History, Problem List, Medications, and Allergies have been reviewed and updated if relevant.  Current Outpatient Prescriptions on File Prior to Visit  Medication Sig Dispense Refill  . amLODipine (NORVASC) 5 MG tablet Take 5 mg by mouth as needed.       . calcium carbonate (OS-CAL) 600 MG TABS Take 600 mg by mouth daily.      . cholecalciferol (VITAMIN D) 1000 UNITS tablet Take 1,000 Units by mouth daily.      Marland Kitchen ibandronate (BONIVA) 150 MG tablet Take 1 tablet (150 mg total) by mouth every 30 (thirty) days.  3 tablet  3  . metoprolol succinate (TOPROL-XL) 25 MG 24 hr tablet Take 25 mg by mouth daily.         Review of Systems: ROS: GEN: Acute illness details above GI: Tolerating PO intake GU: maintaining adequate hydration and urination Pulm: No SOB Interactive and getting along well at home.  Otherwise, ROS is as per the HPI.   Physical Examination: Filed Vitals:   01/13/12  1252  BP: 144/100  Pulse: 88  Temp: 98.2 F (36.8 C)   Filed Vitals:   01/13/12 1252  Weight: 122 lb (55.339 kg)   There is no height on file to calculate BMI. Ideal Body Weight:     GEN: WDWN, A&Ox4,NAD. Non-toxic HEENT: Atraumatc, normocephalic. CV: RRR, No M/G/R PULM: CTA B, No wheezes, crackles, or rhonchi ABD: S, NT, ND, +BS, no rebound. No CVAT. + suprapubic tenderness. GU: Very small amount of blood near the urethra, but normal introitus. Speculum is used to examine the cervix which has no blood, no blood in the vaginal vault. EXT: No c/c/e   Assessment and Plan:  1. Hematuria  POCT urinalysis dipstick, Urine culture  2. Pyelonephritis  Urine culture   Clinical concern for evolving pyelonephritis. Treat with 10 days of ciprofloxacin. Obtain culture.  No evidence for uterine bleeding on examination. I have asked the patient to please observe as her urinary tract and kidney infection or treated. If any symptoms are present, and she does need to have additional evaluation.  Orders Today:  Orders Placed This Encounter  Procedures  . Urine culture  . POCT urinalysis dipstick    Medications Today: (Includes new updates added during medication reconciliation) Meds ordered this encounter  Medications  . calcium carbonate (OS-CAL) 600 MG TABS    Sig: Take 600 mg by mouth daily.  . ciprofloxacin (  CIPRO) 500 MG tablet    Sig: Take 1 tablet (500 mg total) by mouth 2 (two) times daily.    Dispense:  20 tablet    Refill:  0    Medications Discontinued: There are no discontinued medications.   Hannah Beat, MD

## 2012-01-14 ENCOUNTER — Inpatient Hospital Stay: Admission: RE | Admit: 2012-01-14 | Payer: BC Managed Care – PPO | Source: Ambulatory Visit

## 2012-01-16 LAB — URINE CULTURE: Colony Count: >=100000

## 2012-02-10 IMAGING — CR DG CHEST 2V
2 series · 2 of 2 positions shown · non-contrast
Comparison: CT chest 03/12/2010.

CLINICAL DATA: History of pneumonia.

CHEST - 2 VIEW

[view not recorded (1 of 2)]
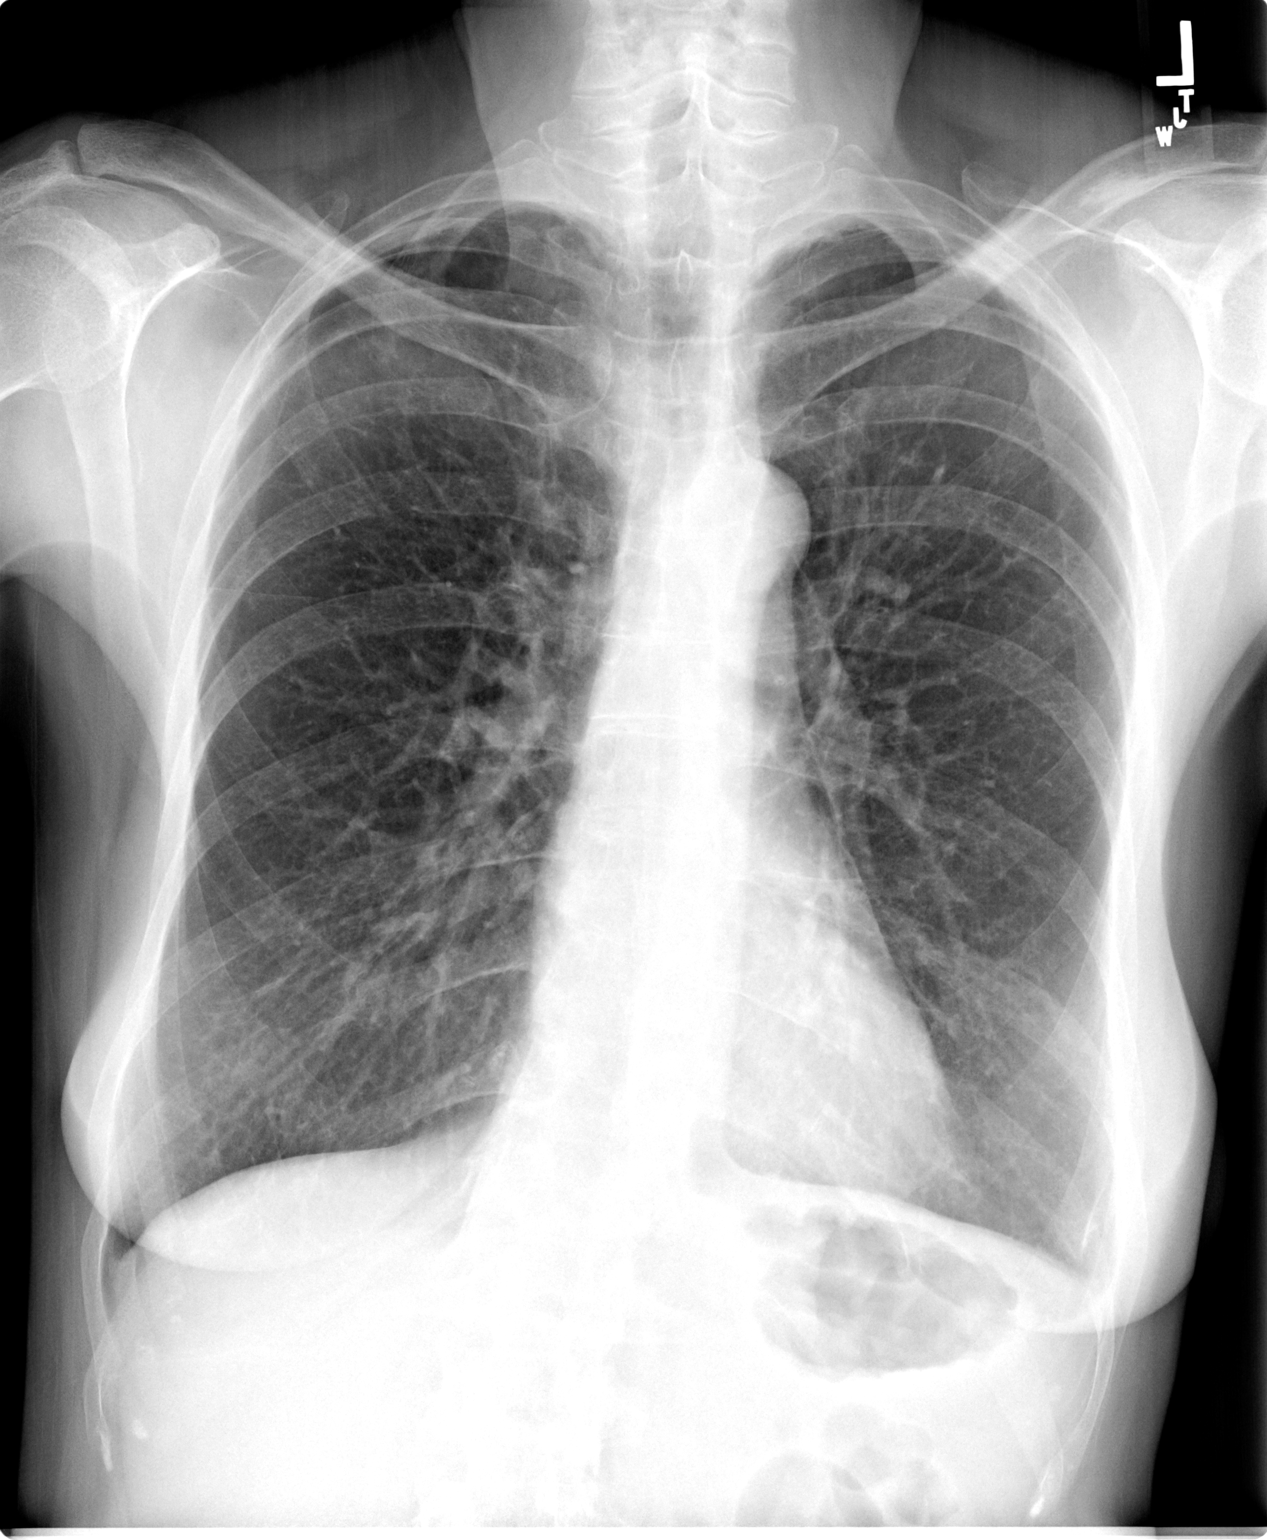

[view not recorded (2 of 2)]
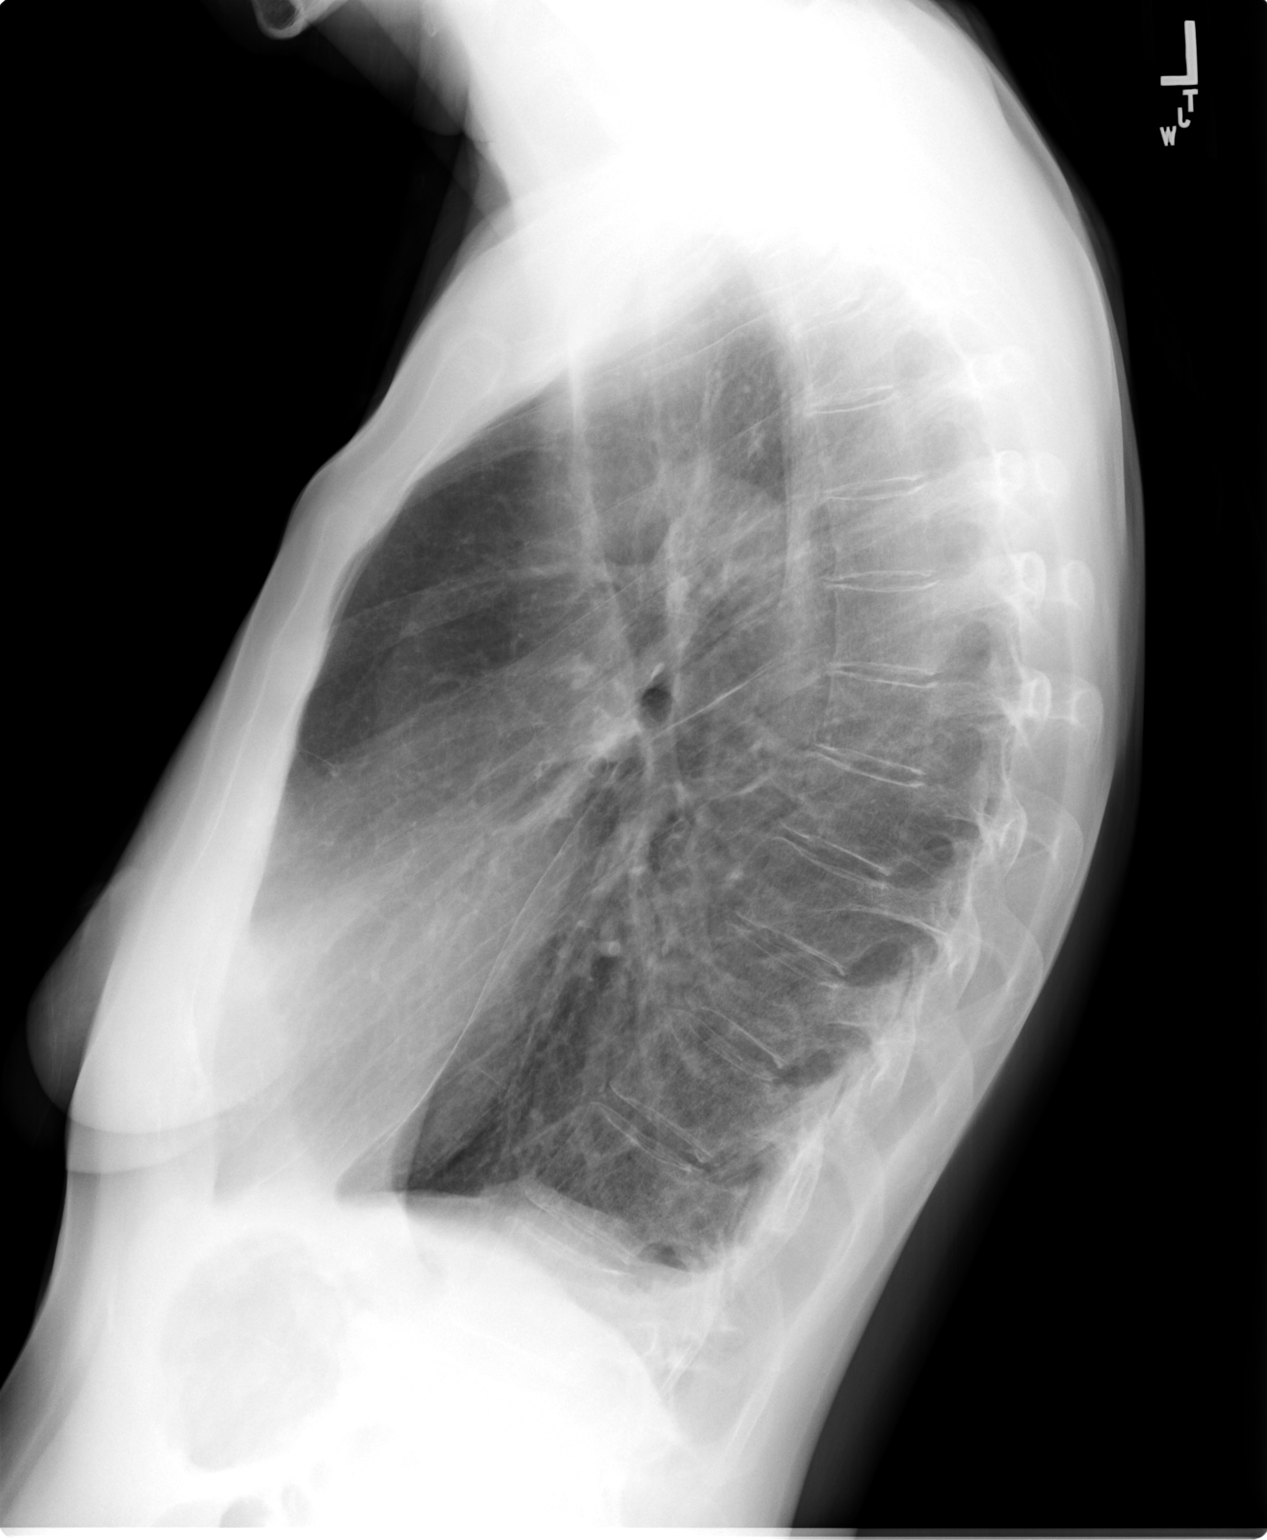

[2 of 2 positions shown; findings below may reference images not displayed]

FINDINGS: Bilateral airspace disease, worst in the left base has
cleared.  The patient has very small bilateral pleural effusions.
Heart size is normal.
IMPRESSION: Pneumonia seen on the prior study is no longer evident.  Very small
bilateral pleural effusions noted.

## 2012-03-01 ENCOUNTER — Encounter: Payer: Self-pay | Admitting: Family Medicine

## 2012-03-01 ENCOUNTER — Ambulatory Visit (INDEPENDENT_AMBULATORY_CARE_PROVIDER_SITE_OTHER)
Admission: RE | Admit: 2012-03-01 | Discharge: 2012-03-01 | Disposition: A | Discharge status: Home / Self Care | Payer: BC Managed Care – PPO | Source: Ambulatory Visit | Attending: Family Medicine | Admitting: Family Medicine

## 2012-03-01 DIAGNOSIS — M810 Age-related osteoporosis without current pathological fracture: Secondary | ICD-10-CM

## 2012-03-03 ENCOUNTER — Other Ambulatory Visit: Payer: BC Managed Care – PPO

## 2012-03-20 ENCOUNTER — Telehealth: Payer: Self-pay

## 2012-03-20 NOTE — Telephone Encounter (Signed)
Pt request bone density results.Please advise.

## 2012-03-21 NOTE — Telephone Encounter (Signed)
Advised patient of results.  She says she has been taking boniva for 2 years and she is asking if there was any improvement with her taking that, and, does she need to continue to take it.  Please advise.

## 2012-03-21 NOTE — Telephone Encounter (Signed)
Notify pt there is osteopenia,stable... Continue vit d ca and weight bearing exercise., Recheck in 2 years.

## 2012-03-23 NOTE — Telephone Encounter (Signed)
Patient notified as instructed by telephone. 

## 2012-03-23 NOTE — Telephone Encounter (Signed)
Called pt and left message on machine for pt to call office back

## 2012-03-23 NOTE — Telephone Encounter (Signed)
Notify pt that her density in her hip was  better and her density in her spine was slightly worse. All in all I would continue. Overall her worst score now was -2.2 wereas before it was -2.7.Candice Gray Candice Gray

## 2012-04-11 ENCOUNTER — Other Ambulatory Visit: Payer: Self-pay | Admitting: *Deleted

## 2012-04-11 MED ORDER — IBANDRONATE SODIUM 150 MG PO TABS: 150.0000 mg | ORAL_TABLET | ORAL | Status: DC

## 2012-05-25 ENCOUNTER — Telehealth: Payer: Self-pay | Admitting: Internal Medicine

## 2012-05-25 NOTE — Telephone Encounter (Signed)
Patient woke up this AM and had nausea and slight pressure in left/center of lower abdomen describes it as discomfort.. She has not eaten today due to nausea. She did drink orange juice. Denies vomiting. Has had several bowel movements today. States her temperature is 99.4 now. States she has just stayed in bed today because she is tired. She will start clear liquids only.  Hx diverticulitis in past. No extender appointments available for tomorrow.  Please, advise.

## 2012-05-25 NOTE — Telephone Encounter (Signed)
Severe diverticulosis on colon 2011, diverticulitis in 2010. Please start Cipro 500 mg po bid, #20, Flagyl 250 mg po tid, #30,, full liquids, call back on Monday with status update, please have CBC drawn tomorrow 05/26/2012.

## 2012-05-26 ENCOUNTER — Encounter: Payer: Self-pay | Admitting: *Deleted

## 2012-05-26 ENCOUNTER — Other Ambulatory Visit: Payer: Self-pay | Admitting: *Deleted

## 2012-05-26 ENCOUNTER — Other Ambulatory Visit (INDEPENDENT_AMBULATORY_CARE_PROVIDER_SITE_OTHER): Payer: BC Managed Care – PPO

## 2012-05-26 DIAGNOSIS — K5792 Diverticulitis of intestine, part unspecified, without perforation or abscess without bleeding: Secondary | ICD-10-CM

## 2012-05-26 DIAGNOSIS — K5732 Diverticulitis of large intestine without perforation or abscess without bleeding: Secondary | ICD-10-CM

## 2012-05-26 LAB — CBC WITH DIFFERENTIAL/PLATELET
Basophils Relative: 0.1 % (ref 0.0–3.0)
Eosinophils Relative: 0 % (ref 0.0–5.0)
HCT: 46.6 % — ABNORMAL HIGH (ref 36.0–46.0)
Hemoglobin: 15.8 g/dL — ABNORMAL HIGH (ref 12.0–15.0)
Lymphs Abs: 2.2 10*3/uL (ref 0.7–4.0)
Monocytes Relative: 7.9 % (ref 3.0–12.0)
Neutro Abs: 21.1 10*3/uL — ABNORMAL HIGH (ref 1.4–7.7)
RBC: 4.7 Mil/uL (ref 3.87–5.11)
RDW: 12.8 % (ref 11.5–14.6)
WBC: 25.8 10*3/uL (ref 4.5–10.5)

## 2012-05-26 MED ORDER — CIPROFLOXACIN HCL 500 MG PO TABS: 500.0000 mg | ORAL_TABLET | Freq: Two times a day (BID) | ORAL | Status: AC

## 2012-05-26 MED ORDER — METRONIDAZOLE 250 MG PO TABS: 250.0000 mg | ORAL_TABLET | Freq: Three times a day (TID) | ORAL | Status: AC

## 2012-05-26 NOTE — Telephone Encounter (Signed)
Spoke with patient and she is feeling better but she will start the antibiotics. She will try to come and have labs drawn this afternoon. She will call with update on Monday.

## 2012-05-26 NOTE — Telephone Encounter (Signed)
error 

## 2012-05-29 ENCOUNTER — Encounter: Payer: Self-pay | Admitting: *Deleted

## 2012-05-29 ENCOUNTER — Telehealth: Payer: Self-pay | Admitting: Internal Medicine

## 2012-05-29 ENCOUNTER — Other Ambulatory Visit: Payer: Self-pay | Admitting: *Deleted

## 2012-05-29 DIAGNOSIS — K5792 Diverticulitis of intestine, part unspecified, without perforation or abscess without bleeding: Secondary | ICD-10-CM

## 2012-05-29 NOTE — Telephone Encounter (Signed)
Labs in EPIC. Line busy will call patient later.

## 2012-05-29 NOTE — Telephone Encounter (Signed)
Spoke with patient and she states she is feeling great. States she did not take her antibiotics because she did not think it was a flare. Denies pain or fever. She is eating bland diet and tolerating well. Gave patient lab results( WBC 25.8) and told her WBC was extremely elevated. She states she is better and does not need antibiotics.

## 2012-05-29 NOTE — Telephone Encounter (Signed)
Patient notified of labs to be drawn

## 2012-05-29 NOTE — Telephone Encounter (Signed)
Please disregard my previous message, which I sent to [facility], She ought to have her CBC repeated tomorrow and either take her antibiotic or be seen by me tomorrow. I want to be sure she does not have a diverticular abcess or some hematological disorder causing her WBS to be elevated to 25 000. If she decides she'd rather take the antibiotic than to see me then she ought to call in 1 week with an update.

## 2012-05-29 NOTE — Telephone Encounter (Signed)
Please check c-met, sed. Rate and CBC

## 2012-05-29 NOTE — Telephone Encounter (Signed)
Spoke with patient and she agrees to come for lab/OV tomorrow. Scheduled at 3:45 PM with Dr. Juanda Chance on 05/30/12. Continues to state she feels fine now. Patient is asking to have sodium level checked also. Please, advise.

## 2012-05-30 ENCOUNTER — Other Ambulatory Visit (INDEPENDENT_AMBULATORY_CARE_PROVIDER_SITE_OTHER): Payer: BC Managed Care – PPO

## 2012-05-30 ENCOUNTER — Ambulatory Visit (INDEPENDENT_AMBULATORY_CARE_PROVIDER_SITE_OTHER): Payer: BC Managed Care – PPO | Admitting: Internal Medicine

## 2012-05-30 ENCOUNTER — Encounter: Payer: Self-pay | Admitting: Internal Medicine

## 2012-05-30 VITALS — BP 100/60 | HR 70 | Ht 68.75 in | Wt 121.8 lb

## 2012-05-30 DIAGNOSIS — R933 Abnormal findings on diagnostic imaging of other parts of digestive tract: Secondary | ICD-10-CM

## 2012-05-30 DIAGNOSIS — K5792 Diverticulitis of intestine, part unspecified, without perforation or abscess without bleeding: Secondary | ICD-10-CM

## 2012-05-30 DIAGNOSIS — R1032 Left lower quadrant pain: Secondary | ICD-10-CM

## 2012-05-30 DIAGNOSIS — K5732 Diverticulitis of large intestine without perforation or abscess without bleeding: Secondary | ICD-10-CM

## 2012-05-30 LAB — CBC WITH DIFFERENTIAL/PLATELET
Basophils Absolute: 0 10*3/uL (ref 0.0–0.1)
HCT: 41.2 % (ref 36.0–46.0)
Lymphs Abs: 2.8 10*3/uL (ref 0.7–4.0)
MCHC: 34.4 g/dL (ref 30.0–36.0)
MCV: 99.7 fl (ref 78.0–100.0)
Monocytes Absolute: 1.3 10*3/uL — ABNORMAL HIGH (ref 0.1–1.0)
Platelets: 214 10*3/uL (ref 150.0–400.0)
RDW: 12.7 % (ref 11.5–14.6)

## 2012-05-30 LAB — COMPREHENSIVE METABOLIC PANEL
ALT: 15 U/L (ref 0–35)
Alkaline Phosphatase: 42 U/L (ref 39–117)
Creatinine, Ser: 0.7 mg/dL (ref 0.4–1.2)
Sodium: 133 mEq/L — ABNORMAL LOW (ref 135–145)
Total Bilirubin: 0.8 mg/dL (ref 0.3–1.2)
Total Protein: 7.5 g/dL (ref 6.0–8.3)

## 2012-05-30 LAB — SEDIMENTATION RATE: Sed Rate: 26 mm/hr — ABNORMAL HIGH (ref 0–22)

## 2012-05-30 NOTE — Patient Instructions (Addendum)
CC: Dr Debara Pickett, Dr Maryellen Pile

## 2012-05-30 NOTE — Progress Notes (Signed)
Candice Gray [birth year] MRN [id number]   History of Present Illness:  This is a 57 year old white female with an acute episode of lower abdominal pain which started 4 days ago in the morning and was associated with low-grade temperature. She called our office and was prescribed Flagyl and Cipro for presumed diverticulitis. Her white cell count came back at a panic level of 25,600. She never took the antibiotics. Patient's abdominal pain resolved within 8-12 hours. She was able to go back on a regular diet and regular activities the next day. Review of her chart shows that she had a similar leukemoid reaction in 2010 when she presented with SIADH. She had a sodium of 108 and white cell count of 32,800. She has been followed by Dr. Sharl Ma, her endocrinologist and has been on water restrictions. The specific etiology for her SIADH was attributed to pneumonia at that time. We saw her in the March 2011 for a screening colonoscopy. A prior colonoscopy in 1995 and 2000 showed diverticulosis. The last exam showed severe diverticulosis with partial obstruction of the sigmoid colon, deep diverticuli and large hypertrophied folds. We had to use a pediatric scope to negotiate through the sigmoid colon. She also had hyperplastic polyps x 3. She comes today for a checkup. She is feeling well. A repeat blood count today shows a white cell count of 10,800. She denies abdominal pain, rectal bleeding or fever. Her sodium is 133.   Past Medical History  Diagnosis Date  . Hyperlipemia   . Diverticulitis   . Diverticulosis   . HTN (hypertension)   . MVP (mitral valve prolapse)   . SIADH (syndrome of inappropriate ADH production)   . Deviated nasal septum   . Hyperplastic colon polyp    Past Surgical History  Procedure Date  . Cryotherapy     for cervical dysplasia    reports that she has quit smoking. Her smoking use included Cigarettes. She has never used smokeless tobacco. She reports that she drinks alcohol.  She reports that she uses illicit drugs (Marijuana). family history includes Cancer in her paternal grandfather; Diabetes in her father and paternal grandmother; Hyperlipidemia in her father and other; and Hypertension in her father and other.  There is no history of Colon cancer. Allergies  Allergen Reactions  . Azelastine Hcl     REACTION: increased bp        Review of Systems denies nausea vomiting fever abdominal pain rectal bleeding:  The remainder of the 10 point ROS is negative except as outlined in H&P   Physical Exam: General appearance  slim, in no distress. Eyes- non icteric. HEENT nontraumatic, normocephalic. Mouth no lesions, tongue papillated, no cheilosis. Neck supple without adenopathy, thyroid not enlarged, no carotid bruits, no JVD. Lungs Clear to auscultation bilaterally. Cor normal S1, normal S2, regular rhythm, no murmur,  quiet precordium. Abdomen: Scaphoid soft abdomen. No distention. Hyperactive bowel sounds. Nontender. No palpable mass or stool. Rectal: Soft Hemoccult negative stool. Extremities no pedal edema. Skin no lesions. Neurological alert and oriented x 3. Psychological normal mood and affect.  Assessment and Plan:  Problem #1 Status post acute episode of abdominal pain associated with leukocytosis which has reverted back to normal after 48-hour. Lower abdominal pain has subsided and there are no signs of an acute abdominal process. She has a history of severe sigmoid diverticulosis and her initial symptoms were suspected to be attributed to diverticulitis but her clinical course is very atypical. She never took the antibiotics Flagyl and  Cipro which we prescribed.  She is not interested in further evaluation at this point. She is up-to-date on her colonoscopy. We will continue to observe her condition. She will have a colonoscopy if she has any irregularity in her bowel habits, abdominal pain or terminal fever.  Problem #2 SIADH, followed by Dr.  Ester Rink.   05/30/2012 [name]

## 2012-07-27 ENCOUNTER — Other Ambulatory Visit: Payer: Self-pay | Admitting: Dermatology

## 2012-10-06 ENCOUNTER — Telehealth: Payer: Self-pay | Admitting: Internal Medicine

## 2012-10-06 MED ORDER — AMLODIPINE BESYLATE 5 MG PO TABS: 5.0000 mg | ORAL_TABLET | ORAL | Status: DC | PRN

## 2012-10-06 NOTE — Telephone Encounter (Signed)
Need refill on Amlodipine#30-call to [facility] [phone]!

## 2012-10-23 ENCOUNTER — Telehealth: Payer: Self-pay | Admitting: Family Medicine

## 2012-10-23 NOTE — Telephone Encounter (Signed)
Pt is calling to see if she needs to come in for lab work.. Pt had fever over the weekend (onset 10/21/12).  Pt had other symptoms of fatigue and ringing in ears.  Pt woke up today and is afebrile and has more energy.  However, pt has a history of 2 tick bites within past couple of weeks.  Pt denies any rash or headache.  RN triaged patient per Tick Bite Protocol and came to Home Care Disposition.  Pt just wanted question asked to Dr Ermalene Searing if she needed to come in for lab work.  OFFICE PLEASE FOLLOW UP WITH PT.

## 2012-10-24 NOTE — Telephone Encounter (Signed)
Patient advised.

## 2012-10-24 NOTE — Telephone Encounter (Signed)
Does not sound like she needs appt or labs... If symptoms return and persist ( fever, headache, joint pain, neck stiffness, flu like syndrome).. Needs to make appt to be seen. Most likly she had viral illness, self limiting.

## 2012-11-06 ENCOUNTER — Other Ambulatory Visit: Payer: Self-pay | Admitting: *Deleted

## 2012-11-06 MED ORDER — IBANDRONATE SODIUM 150 MG PO TABS: 150.0000 mg | ORAL_TABLET | ORAL | Status: DC

## 2012-11-06 NOTE — Telephone Encounter (Signed)
Received faxed refill request from pharmacy. Patient has an upcoming appointment scheduled. Refill sent to pharmacy electronically.

## 2012-12-20 ENCOUNTER — Ambulatory Visit: Payer: BC Managed Care – PPO | Admitting: Family Medicine

## 2012-12-20 ENCOUNTER — Ambulatory Visit (INDEPENDENT_AMBULATORY_CARE_PROVIDER_SITE_OTHER)
Admission: RE | Admit: 2012-12-20 | Discharge: 2012-12-20 | Disposition: A | Discharge status: Home / Self Care | Payer: BC Managed Care – PPO | Source: Ambulatory Visit | Attending: Family Medicine | Admitting: Family Medicine

## 2012-12-20 ENCOUNTER — Ambulatory Visit (INDEPENDENT_AMBULATORY_CARE_PROVIDER_SITE_OTHER): Payer: BC Managed Care – PPO | Admitting: Family Medicine

## 2012-12-20 ENCOUNTER — Encounter: Payer: Self-pay | Admitting: Family Medicine

## 2012-12-20 VITALS — BP 98/64 | HR 116 | Temp 98.3°F | Ht 68.75 in | Wt 118.8 lb

## 2012-12-20 DIAGNOSIS — R059 Cough, unspecified: Secondary | ICD-10-CM

## 2012-12-20 DIAGNOSIS — R000 Tachycardia, unspecified: Secondary | ICD-10-CM

## 2012-12-20 DIAGNOSIS — R05 Cough: Secondary | ICD-10-CM

## 2012-12-20 DIAGNOSIS — W57XXXA Bitten or stung by nonvenomous insect and other nonvenomous arthropods, initial encounter: Secondary | ICD-10-CM

## 2012-12-20 DIAGNOSIS — T148 Other injury of unspecified body region: Secondary | ICD-10-CM

## 2012-12-20 DIAGNOSIS — J189 Pneumonia, unspecified organism: Secondary | ICD-10-CM | POA: Insufficient documentation

## 2012-12-20 DIAGNOSIS — R5381 Other malaise: Secondary | ICD-10-CM

## 2012-12-20 DIAGNOSIS — I10 Essential (primary) hypertension: Secondary | ICD-10-CM

## 2012-12-20 DIAGNOSIS — R5383 Other fatigue: Secondary | ICD-10-CM

## 2012-12-20 NOTE — Progress Notes (Signed)
Subjective:    Patient ID: Candice Gray, female    DOB: 10/20/1955, 57 y.o.   MRN: [id number]  HPI Here for symptoms after a tick bite   Latest bout of symptoms started yesterday am  Fever - of 100  bp went up to 200/100 and high HR --middle of the day  (if she takes both of her bp meds every day her bp gets too low ) - but took it yesterday  Nausea and vomiting -- following cough No diarrhea  Heart rate is generally trending higher with time  No uri symptoms except for a cough (usually a dry cough)- is any phlegm- scant , and a bit of post nasal drip yesterday  No rash   No abd pain (she has hx of diverticulosis)  This whole symptom profile has happened twice in the past month  Last tick bite was in June  No redness or bullseye area around the bite  Her tick bites have all been deer ticks (very tiny ticks)= has had many of [place]   Has hx of SIADH- she did have to hydrate yesterday and is worried about her sodium level  Patient Active Problem List   Diagnosis Date Noted  . Tachycardia 12/20/2012  . Other malaise and fatigue 12/20/2012  . Tick bite 12/20/2012  . Cough 12/20/2012  . PREDIABETES 11/04/2009  . 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 HYPERLIPIDEMIA 08/13/2008  . HYPERTENSION, BENIGN ESSENTIAL, LABILE 08/13/2008  . MITRAL VALVE PROLAPSE 08/13/2008  . ALLERGIC RHINITIS, SEASONAL 08/13/2008  . DIVERTICULOSIS, SIGMOID COLON 08/13/2008  . NUMMULAR ECZEMA 08/13/2008  . Other osteoporosis 08/13/2008   Past Medical History  Diagnosis Date  . Hyperlipemia   . Diverticulitis   . Diverticulosis   . HTN (hypertension)   . MVP (mitral valve prolapse)   . SIADH (syndrome of inappropriate ADH production)   . Deviated nasal septum   . Hyperplastic colon polyp    Past Surgical History  Procedure Laterality Date  . Cryotherapy      for cervical dysplasia   History  Substance Use Topics  . Smoking status: Former Smoker    Types: Cigarettes   . Smokeless tobacco: Never Used  . Alcohol Use: Yes     Comment: 3 beers daily   Family History  Problem Relation Age of Onset  . Hyperlipidemia Father   . Hypertension Father   . Diabetes Father   . Diabetes Paternal Grandmother   . Cancer Paternal Grandfather     brain tumor  . Hyperlipidemia Other   . Hypertension Other   . Colon cancer Neg Hx    Allergies  Allergen Reactions  . Azelastine Hcl     REACTION: increased bp   Current Outpatient Prescriptions on File Prior to Visit  Medication Sig Dispense Refill  . amLODipine (NORVASC) 5 MG tablet Take 1 tablet (5 mg total) by mouth as needed.  30 tablet  5  . calcium carbonate (OS-CAL) 600 MG TABS Take 600 mg by mouth daily.      . cholecalciferol (VITAMIN D) 1000 UNITS tablet Take 1,000 Units by mouth daily.      . metoprolol succinate (TOPROL-XL) 25 MG 24 hr tablet Take 25 mg by mouth daily.       . vitamin C (ASCORBIC ACID) 500 MG tablet Take 500 mg by mouth daily.      Marland Kitchen ibandronate (BONIVA) 150 MG tablet Take 1 tablet (150 mg total) by mouth every  30 (thirty) days.  3 tablet  0   No current facility-administered medications on file prior to visit.    Review of Systems Review of Systems  Constitutional: pos for several episodes of fever/ a month of fatigue/ difficulty trying to gain weight and malaise for 2 d Eyes: Negative for pain and visual disturbance.  ENT neg for ST or ear pain or facial pain  Respiratory: Negative for wheeze  and shortness of breath.  pos for some cough  Cardiovascular: Negative for cp or irregular heart rate, pos for generally increasing HR over time , pos for labile bp for which she has seen cardiology  Gastrointestinal: Negative for nausea, diarrhea and constipation. neg for n/v Genitourinary: Negative for urgency and frequency.  Skin: Negative for pallor or rash   MSK neg for joint swelling or pain  Neurological: Negative for weakness, light-headedness, numbness and headaches.   Hematological: Negative for adenopathy. Does not bruise/bleed easily.  Psychiatric/Behavioral: Negative for dysphoric mood. The patient is not nervous/anxious. (though per pt cardiology has suggested xanax to help her labile bp)        Objective:   Physical Exam  Constitutional: She appears well-developed and well-nourished. No distress.  Slim and anxious appearing female  HENT:  Head: Normocephalic and atraumatic.  Right Ear: External ear normal.  Left Ear: External ear normal.  Nose: Nose normal.  Mouth/Throat: Oropharynx is clear and moist.  No sinus tenderness No temporal tenderness  Eyes: Conjunctivae and EOM are normal. Pupils are equal, round, and reactive to light. Right eye exhibits no discharge. Left eye exhibits no discharge. No scleral icterus.  Neck: Normal range of motion. Neck supple. No JVD present. No thyromegaly present.  Cardiovascular: Regular rhythm and intact distal pulses.  Exam reveals no gallop.   Pulmonary/Chest: Effort normal and breath sounds normal. No respiratory distress. She has no wheezes. She has no rales. She exhibits no tenderness.  Good air exch  Abdominal: Soft. Bowel sounds are normal. She exhibits no distension and no mass. There is no tenderness.  No hsm  Musculoskeletal: She exhibits no edema and no tenderness.  No acute joint changes   Lymphadenopathy:    She has no cervical adenopathy.  Neurological: She is alert. She has normal reflexes. No cranial nerve deficit. She exhibits normal muscle tone. Coordination normal.  Skin: Skin is warm and dry. No rash noted. No erythema. No pallor.  No tick bites visible  Psychiatric: Her mood appears anxious. Her affect is not blunt. Her speech is rapid and/or pressured. Cognition and memory are normal. She does not exhibit a depressed mood.  Pt seems quite anxious to me with pressured speech  She denies anxiety or stressors however Supportive husband present          Assessment & Plan:

## 2012-12-20 NOTE — Patient Instructions (Addendum)
Labs today  Chest xray today If symptoms suddenly worsen or blood pressure will not return to normal or high fever after hours- go to the ER  We will update and make a plan

## 2012-12-21 ENCOUNTER — Telehealth: Payer: Self-pay | Admitting: *Deleted

## 2012-12-21 LAB — COMPREHENSIVE METABOLIC PANEL
ALT: 21 U/L (ref 0–35)
BUN: 18 mg/dL (ref 6–23)
CO2: 29 mEq/L (ref 19–32)
Calcium: 11.8 mg/dL — ABNORMAL HIGH (ref 8.4–10.5)
Chloride: 87 mEq/L — ABNORMAL LOW (ref 96–112)
Creatinine, Ser: 0.8 mg/dL (ref 0.4–1.2)
GFR: 78.51 mL/min (ref >60.00)
Glucose, Bld: 145 mg/dL — ABNORMAL HIGH (ref 70–99)
Total Bilirubin: 1 mg/dL (ref 0.3–1.2)

## 2012-12-21 LAB — CBC WITH DIFFERENTIAL/PLATELET
Basophils Absolute: 0 10*3/uL (ref 0.0–0.1)
Basophils Relative: 0.1 % (ref 0.0–3.0)
HCT: 48.6 % — ABNORMAL HIGH (ref 36.0–46.0)
Hemoglobin: 16.5 g/dL — ABNORMAL HIGH (ref 12.0–15.0)
Lymphs Abs: 2.4 10*3/uL (ref 0.7–4.0)
Monocytes Relative: 5.4 % (ref 3.0–12.0)
Neutro Abs: 28.6 10*3/uL — ABNORMAL HIGH (ref 1.4–7.7)
RBC: 4.82 Mil/uL (ref 3.87–5.11)
RDW: 14.1 % (ref 11.5–14.6)

## 2012-12-21 LAB — TSH: TSH: 2.88 u[IU]/mL (ref 0.35–5.50)

## 2012-12-21 LAB — B. BURGDORFI ANTIBODIES: B burgdorferi Ab IgG+IgM: 0.6 {ISR}

## 2012-12-21 LAB — VITAMIN B12: Vitamin B-12: 390 pg/mL (ref 211–911)

## 2012-12-21 MED ORDER — AZITHROMYCIN 250 MG PO TABS: ORAL_TABLET | ORAL | Status: DC

## 2012-12-21 NOTE — Assessment & Plan Note (Signed)
None evident today and no rash She has had intermittent fever/ fatigue and exp to deer ticks Tick fever lab today  Disc poss of STARI as well  Pending lab and also cxr currently

## 2012-12-21 NOTE — Assessment & Plan Note (Signed)
Over a month Now with intermittent fever times 2 Pt is anxious  Labs today  cxr today

## 2012-12-21 NOTE — Telephone Encounter (Signed)
Addressed by Dr. Karie Schwalbe.  She is now on antibiotic for posisble lung infilatrate on CXR.  She is to follow up next week. Make sure appt sheduled.

## 2012-12-21 NOTE — Assessment & Plan Note (Signed)
cxr today Fever last night

## 2012-12-21 NOTE — Telephone Encounter (Signed)
Received call with critical lab results. WBC 32.3. Results have not been released into emr yet.

## 2012-12-21 NOTE — Telephone Encounter (Signed)
Advised patient, follow up appt scheduled for 8/5.  z-pack sent to [facility], per Dr Milinda Antis.

## 2012-12-21 NOTE — Assessment & Plan Note (Signed)
Rhythm is regular but fast  At total rest-goes down to 100- per pt this has been inc gradually for a while and she sees cardiol for labile bp and mitral valve issues  Unsure if this is related She also seems anxious and has had a fever intermittently  EKG shows some inferior ST changes from repolarization - do not see old one for comparison  Lab today

## 2012-12-21 NOTE — Assessment & Plan Note (Signed)
Per pt she has seen cardiology for this  Went up yesterday and came back down with med that she takes intermittently

## 2012-12-22 ENCOUNTER — Other Ambulatory Visit: Payer: Self-pay | Admitting: Family Medicine

## 2012-12-22 LAB — ROCKY MTN SPOTTED FVR ABS PNL(IGG+IGM): RMSF IgG: 0.72 IV

## 2012-12-26 ENCOUNTER — Ambulatory Visit (INDEPENDENT_AMBULATORY_CARE_PROVIDER_SITE_OTHER): Payer: BC Managed Care – PPO | Admitting: Family Medicine

## 2012-12-26 ENCOUNTER — Encounter: Payer: Self-pay | Admitting: Family Medicine

## 2012-12-26 VITALS — BP 108/68 | HR 106 | Temp 97.6°F | Wt 119.0 lb

## 2012-12-26 DIAGNOSIS — J189 Pneumonia, unspecified organism: Secondary | ICD-10-CM

## 2012-12-26 DIAGNOSIS — R7309 Other abnormal glucose: Secondary | ICD-10-CM

## 2012-12-26 DIAGNOSIS — D72829 Elevated white blood cell count, unspecified: Secondary | ICD-10-CM | POA: Insufficient documentation

## 2012-12-26 DIAGNOSIS — E236 Other disorders of pituitary gland: Secondary | ICD-10-CM

## 2012-12-26 DIAGNOSIS — I10 Essential (primary) hypertension: Secondary | ICD-10-CM

## 2012-12-26 LAB — COMPREHENSIVE METABOLIC PANEL
AST: 22 U/L (ref 0–37)
Albumin: 4 g/dL (ref 3.5–5.2)
Alkaline Phosphatase: 41 U/L (ref 39–117)
BUN: 13 mg/dL (ref 6–23)
Creatinine, Ser: 0.8 mg/dL (ref 0.4–1.2)
Glucose, Bld: 92 mg/dL (ref 70–99)
Total Bilirubin: 0.5 mg/dL (ref 0.3–1.2)

## 2012-12-26 LAB — CBC WITH DIFFERENTIAL/PLATELET
Basophils Relative: 0.3 % (ref 0.0–3.0)
Eosinophils Absolute: 0.1 10*3/uL (ref 0.0–0.7)
Eosinophils Relative: 0.8 % (ref 0.0–5.0)
Hemoglobin: 14.4 g/dL (ref 12.0–15.0)
MCHC: 33.5 g/dL (ref 30.0–36.0)
MCV: 100.9 fl — ABNORMAL HIGH (ref 78.0–100.0)
Monocytes Absolute: 1.5 10*3/uL — ABNORMAL HIGH (ref 0.1–1.0)
Neutro Abs: 6.5 10*3/uL (ref 1.4–7.7)
Neutrophils Relative %: 58.9 % (ref 43.0–77.0)
RBC: 4.25 Mil/uL (ref 3.87–5.11)
WBC: 11.1 10*3/uL — ABNORMAL HIGH (ref 4.5–10.5)

## 2012-12-26 NOTE — Assessment & Plan Note (Signed)
Improved with antibiotics. Likely cause of all her symptoms.

## 2012-12-26 NOTE — Assessment & Plan Note (Signed)
Returned to normal range on current meds.

## 2012-12-26 NOTE — Assessment & Plan Note (Signed)
Likely due to poor diet  and emesis. Re-eval.

## 2012-12-26 NOTE — Assessment & Plan Note (Signed)
Has been fluid restricting.

## 2012-12-26 NOTE — Patient Instructions (Addendum)
We will call you with lab results.       

## 2012-12-26 NOTE — Assessment & Plan Note (Signed)
Due to lung infection.. Re eval for improvement and resolution.

## 2012-12-26 NOTE — Progress Notes (Signed)
  Subjective:    Patient ID: Candice Gray, female    DOB: 1955-10-08, 57 y.o.   MRN: [id number]  HPI  57 year old female seen recently by Dr. Milinda Antis on 7/30  for  2 episodes of tachycardia, fatigue, cough, elevated BP and N/V following cough.  With recent tick bite.  She was found to have small infilitrate on CXR... Started on Zpack. ( She had rib fracture 2/14 , so may not have been taking deep breaths for a while increasing her risk of infection.) Wbc were high at 32, sodium was low at 128 (has history of SIADH), ALT slightly high. She is limiting her fluid intake.   RMSF, Lyme titers negative  nml tsh and B12   She reports today that she feels 100% better after completing antibiotics. Her BP has been normal and pulse (80) is normal at home. Cough is better but just mild dry cough. No further vomiting. No fever. Wt Readings from Last 3 Encounters:  12/26/12 119 lb (53.978 kg)  12/20/12 118 lb 12 oz (53.865 kg)  05/30/12 121 lb 12.8 oz (55.248 kg)     Review of Systems  Constitutional: Negative for fever and fatigue.  HENT: Negative for ear pain.   Eyes: Negative for pain.  Respiratory: Negative for chest tightness and shortness of breath.   Cardiovascular: Negative for chest pain, palpitations and leg swelling.  Gastrointestinal: Negative for abdominal pain.  Genitourinary: Negative for dysuria.       Objective:   Physical Exam  Constitutional: Vital signs are normal. She appears well-developed and well-nourished. She is cooperative.  Non-toxic appearance. She does not appear ill. No distress.  Thin appearing female in NAD  HENT:  Head: Normocephalic.  Right Ear: Hearing, tympanic membrane, external ear and ear canal normal. Tympanic membrane is not erythematous, not retracted and not bulging.  Left Ear: Hearing, tympanic membrane, external ear and ear canal normal. Tympanic membrane is not erythematous, not retracted and not bulging.  Nose: No mucosal edema or  rhinorrhea. Right sinus exhibits no maxillary sinus tenderness and no frontal sinus tenderness. Left sinus exhibits no maxillary sinus tenderness and no frontal sinus tenderness.  Mouth/Throat: Uvula is midline, oropharynx is clear and moist and mucous membranes are normal.  Eyes: Conjunctivae, EOM and lids are normal. Pupils are equal, round, and reactive to light. No foreign bodies found.  Neck: Trachea normal and normal range of motion. Neck supple. Carotid bruit is not present. No mass and no thyromegaly present.  Cardiovascular: Normal rate, regular rhythm, S1 normal, S2 normal, normal heart sounds, intact distal pulses and normal pulses.  Exam reveals no gallop and no friction rub.   No murmur heard. Pulmonary/Chest: Effort normal and breath sounds normal. Not tachypneic. No respiratory distress. She has no decreased breath sounds. She has no wheezes. She has no rhonchi. She has no rales.  Abdominal: Soft. Normal appearance and bowel sounds are normal. There is no tenderness.  Neurological: She is alert.  Skin: Skin is warm, dry and intact. No rash noted.  Psychiatric: Her speech is normal and behavior is normal. Judgment and thought content normal. Her mood appears not anxious. Cognition and memory are normal. She does not exhibit a depressed mood.          Assessment & Plan:

## 2013-01-02 ENCOUNTER — Other Ambulatory Visit (INDEPENDENT_AMBULATORY_CARE_PROVIDER_SITE_OTHER): Payer: BC Managed Care – PPO

## 2013-01-02 ENCOUNTER — Encounter: Payer: Self-pay | Admitting: Family Medicine

## 2013-01-02 ENCOUNTER — Telehealth: Payer: Self-pay | Admitting: Family Medicine

## 2013-01-02 DIAGNOSIS — R7309 Other abnormal glucose: Secondary | ICD-10-CM

## 2013-01-02 DIAGNOSIS — E785 Hyperlipidemia, unspecified: Secondary | ICD-10-CM

## 2013-01-02 DIAGNOSIS — D72829 Elevated white blood cell count, unspecified: Secondary | ICD-10-CM

## 2013-01-02 DIAGNOSIS — E236 Other disorders of pituitary gland: Secondary | ICD-10-CM

## 2013-01-02 DIAGNOSIS — I10 Essential (primary) hypertension: Secondary | ICD-10-CM

## 2013-01-02 LAB — COMPREHENSIVE METABOLIC PANEL
Albumin: 3.7 g/dL (ref 3.5–5.2)
Alkaline Phosphatase: 33 U/L — ABNORMAL LOW (ref 39–117)
BUN: 14 mg/dL (ref 6–23)
Calcium: 10.3 mg/dL (ref 8.4–10.5)
Chloride: 101 mEq/L (ref 96–112)
Creatinine, Ser: 0.8 mg/dL (ref 0.4–1.2)
Glucose, Bld: 95 mg/dL (ref 70–99)
Potassium: 4.3 mEq/L (ref 3.5–5.1)

## 2013-01-02 LAB — LIPID PANEL
Cholesterol: 178 mg/dL (ref 0–200)
HDL: 80.5 mg/dL (ref >39.00)
Triglycerides: 44 mg/dL (ref 0.0–149.0)

## 2013-01-02 LAB — CBC WITH DIFFERENTIAL/PLATELET
Basophils Absolute: 0 10*3/uL (ref 0.0–0.1)
Eosinophils Absolute: 0.1 10*3/uL (ref 0.0–0.7)
HCT: 39.9 % (ref 36.0–46.0)
Hemoglobin: 13.3 g/dL (ref 12.0–15.0)
Lymphs Abs: 2.2 10*3/uL (ref 0.7–4.0)
MCHC: 33.3 g/dL (ref 30.0–36.0)
MCV: 102.1 fl — ABNORMAL HIGH (ref 78.0–100.0)
Monocytes Absolute: 0.7 10*3/uL (ref 0.1–1.0)
Neutro Abs: 4.4 10*3/uL (ref 1.4–7.7)
Platelets: 256 10*3/uL (ref 150.0–400.0)
RDW: 13.8 % (ref 11.5–14.6)

## 2013-01-02 NOTE — Telephone Encounter (Signed)
Message copied by Kerby Nora E on Tue Jan 02, 2013  8:25 AM ------      Message from: Alvina Chou      Created: Wed Dec 27, 2012 12:42 PM      Regarding: Lab orders for Tuesday, 8.12.14       Patient is scheduled for CPX labs, please order future labs, Thanks , Terri       ------

## 2013-01-05 ENCOUNTER — Encounter: Payer: BC Managed Care – PPO | Admitting: Family Medicine

## 2013-01-09 ENCOUNTER — Ambulatory Visit (INDEPENDENT_AMBULATORY_CARE_PROVIDER_SITE_OTHER): Payer: BC Managed Care – PPO | Admitting: Family Medicine

## 2013-01-09 ENCOUNTER — Ambulatory Visit (INDEPENDENT_AMBULATORY_CARE_PROVIDER_SITE_OTHER)
Admission: RE | Admit: 2013-01-09 | Discharge: 2013-01-09 | Disposition: A | Discharge status: Home / Self Care | Payer: BC Managed Care – PPO | Source: Ambulatory Visit | Attending: Family Medicine | Admitting: Family Medicine

## 2013-01-09 ENCOUNTER — Encounter: Payer: Self-pay | Admitting: Family Medicine

## 2013-01-09 VITALS — BP 122/82 | HR 78 | Temp 98.1°F | Ht 68.75 in | Wt 118.2 lb

## 2013-01-09 DIAGNOSIS — R636 Underweight: Secondary | ICD-10-CM

## 2013-01-09 DIAGNOSIS — R071 Chest pain on breathing: Secondary | ICD-10-CM

## 2013-01-09 DIAGNOSIS — R0789 Other chest pain: Secondary | ICD-10-CM

## 2013-01-09 DIAGNOSIS — E236 Other disorders of pituitary gland: Secondary | ICD-10-CM

## 2013-01-09 DIAGNOSIS — I10 Essential (primary) hypertension: Secondary | ICD-10-CM

## 2013-01-09 DIAGNOSIS — Z0000 Encounter for general adult medical examination without abnormal findings: Secondary | ICD-10-CM

## 2013-01-09 DIAGNOSIS — M818 Other osteoporosis without current pathological fracture: Secondary | ICD-10-CM

## 2013-01-09 DIAGNOSIS — E785 Hyperlipidemia, unspecified: Secondary | ICD-10-CM

## 2013-01-09 NOTE — Assessment & Plan Note (Signed)
Will re-eval with repeat CXR for resolution of pleural fluid.

## 2013-01-09 NOTE — Assessment & Plan Note (Signed)
Well-controlled at this point 

## 2013-01-09 NOTE — Assessment & Plan Note (Signed)
Discussed healthy eating and weight gain.

## 2013-01-09 NOTE — Progress Notes (Signed)
57 year old female  The patient is here for annual wellness exam and preventative care.   She recently had PNA 12/20/2012 causing electrolyte changes. She has started having still having some pain under right breast, started in last 3 weeks after PNA resolved. WBC back in nml range at last check.  No longer back pain. She feels that this sharp pain is due to her lungs not a rib fracture... Occurs after being on feet. No exertional relationship or food relationships. No change with dep breaths.  No SOB, no cough , no wheeze.  She has pneumonia vaccine.  She is underweight.. Has always been so.. She states she eats a lot but never gains weight.  She does state she think she has been anorexic, doesn't eat at times for stress, but has been doing better overall. Wt Readings from Last 3 Encounters:  01/09/13 118 lb 4 oz (53.638 kg)  12/26/12 119 lb (53.978 kg)  12/20/12 118 lb 12 oz (53.865 kg)       Hypertension: Well controlled on NO meds in last few weeks.Followed by Dr. Clarene Duke  BP Readings from Last 3 Encounters:  01/09/13 122/82  12/26/12 108/68  12/20/12 98/64  Using medication without problems or lightheadedness: None Chest pain with exertion: None  Edema:None  Short of breath:None  Average home ZHY:QMVH controlled.  Other issues:None   Elevated Cholesterol: LDL at goal <130, and HDL good, high.  Lab Results   Component  Value  Date    CHOL  220*  12/31/2011    HDL  137.70  12/31/2011    LDLCALC  85  12/17/2008    LDLDIRECT  74.6  12/31/2011    TRIG  63.0  12/31/2011    CHOLHDL  2  12/31/2011    Prediabetes: resolved   SIADH: Last OV in 08/2011 with Dr. Sharl Ma, ENDO. Reviewed note in detail. Having her continue fluid restriction.  Seeing Dr. Jeanella Craze felt evaluation for elevated MCV was negative, per Dr. Bea Laura under microscope rbcs appear normal.  Osteoporosis: On boniva since 2011, this is when last DEXA done. On ca supplement.  Has been under a lot of stress lately. Father in law is  ill.  Na last check was NML!!!!  Review of Systems  Constitutional: Negative for fever and fatigue.  HENT: Negative for ear pain.  Eyes: Negative for pain.  Respiratory: Negative for chest tightness and shortness of breath.  Cardiovascular: Negative for chest pain, palpitations and leg swelling.  Gastrointestinal: Negative for abdominal pain.  Genitourinary: Negative for dysuria.  Objective:   Physical Exam  Constitutional: Vital signs are normal. She appears well-developed and well-nourished. She is cooperative. Non-toxic appearance. She does not appear ill. No distress.  HENT: Poor dentition, dental caries and discoloration.  Head: Normocephalic.  Right Ear: Hearing, tympanic membrane, external ear and ear canal normal.  Left Ear: Hearing, tympanic membrane, external ear and ear canal normal.  Nose: Nose normal.  Eyes: Conjunctivae, EOM and lids are normal. Pupils are equal, round, and reactive to light. No foreign bodies found.  Neck: Trachea normal and normal range of motion. Neck supple. Carotid bruit is not present. No mass and no thyromegaly present.  Cardiovascular: Normal rate, regular rhythm, S1 normal, S2 normal, normal heart sounds and intact distal pulses. Exam reveals no gallop.  No murmur heard.  Pulmonary/Chest: Effort normal and breath sounds normal. No respiratory distress. She has no wheezes. She has no rhonchi. She has no rales.  Abdominal: Soft. Normal appearance and bowel  sounds are normal. She exhibits no distension, no fluid wave, no abdominal bruit and no mass. There is no hepatosplenomegaly. There is no tenderness. There is no rebound, no guarding and no CVA tenderness. No hernia.  Genitourinary: Vagina normal and uterus normal. No breast swelling, tenderness, discharge or bleeding. Pelvic exam was performed with patient prone. There is no rash, tenderness or lesion on the right labia. There is no rash, tenderness or lesion on the left labia. Uterus is not enlarged  and not tender.  Right adnexum displays no mass, no tenderness and no fullness. Left adnexum displays no mass, no tenderness and no fullness. PAP NOT performed.  Lymphadenopathy:  She has no cervical adenopathy.  She has no axillary adenopathy.  Neurological: She is alert. She has normal strength. No cranial nerve deficit or sensory deficit.  Skin: Skin is warm, dry and intact. No rash noted.  Psychiatric: Her speech is normal and behavior is normal. Judgment normal. Her mood appears not anxious. Cognition and memory are normal. She does not exhibit a depressed mood.  Assessment & Plan:   Complete Physical Exam: The patient's preventative maintenance and recommended screening tests for an annual wellness exam were reviewed in full today.  Brought up to date unless services declined.  Counselled on the importance of diet, exercise, and its role in overall health and mortality.  The patient's FH and SH was reviewed, including their home life, tobacco status, and drug and alcohol status.   Last DEXA 2013 osteopenia on boniva in past... She has stopped it in last few months...plans to restart. Last colon: 2011 was bengin polyps.. No repeat screen in this way given small caliber colon. Further eval per Dr. Juanda Chance. Likely in 10 years ifob. UTD with vaccines Td 2010 Mammo nml 12/2012  Pap q 2 years, last nml in 2013 with DVE.  Counseled against marijuana use.   Nonsmoker

## 2013-01-09 NOTE — Assessment & Plan Note (Signed)
Well controlled on no med. Follow at home.

## 2013-01-09 NOTE — Patient Instructions (Addendum)
Healthy eating and work on weight gain. Consider boost supplements after regular meals 1-3 times daily. Stop for  CXR on your way out.

## 2013-01-09 NOTE — Assessment & Plan Note (Signed)
Well controlled on no med. 

## 2013-03-29 ENCOUNTER — Other Ambulatory Visit: Payer: Self-pay

## 2013-04-02 ENCOUNTER — Telehealth: Payer: Self-pay | Admitting: Family Medicine

## 2013-04-02 NOTE — Telephone Encounter (Signed)
Patient said she saw you in July and you diagnosed her with pneumonia.  Patient said she felt very comfortable talking to you and wants to know if she can switch from [facility] to you.  I told patient that you're not taking new patients, but she wanted me to [facility].

## 2013-04-02 NOTE — Telephone Encounter (Signed)
Tell her - sorry/ I cannot - I'm just too full right now and it is difficult to get in with me

## 2013-04-09 ENCOUNTER — Other Ambulatory Visit: Payer: Self-pay | Admitting: *Deleted

## 2013-04-09 MED ORDER — IBANDRONATE SODIUM 150 MG PO TABS: 150.0000 mg | ORAL_TABLET | ORAL | Status: DC

## 2013-05-29 ENCOUNTER — Encounter: Payer: Self-pay | Admitting: *Deleted

## 2013-06-01 ENCOUNTER — Encounter: Payer: Self-pay | Admitting: Internal Medicine

## 2013-06-05 ENCOUNTER — Encounter: Payer: Self-pay | Admitting: Internal Medicine

## 2013-06-05 ENCOUNTER — Ambulatory Visit (INDEPENDENT_AMBULATORY_CARE_PROVIDER_SITE_OTHER): Payer: BC Managed Care – PPO | Admitting: Internal Medicine

## 2013-06-05 VITALS — BP 132/90 | HR 78 | Ht 69.0 in | Wt 131.2 lb

## 2013-06-05 DIAGNOSIS — E785 Hyperlipidemia, unspecified: Secondary | ICD-10-CM

## 2013-06-05 DIAGNOSIS — R0789 Other chest pain: Secondary | ICD-10-CM

## 2013-06-05 DIAGNOSIS — I10 Essential (primary) hypertension: Secondary | ICD-10-CM

## 2013-06-05 DIAGNOSIS — R071 Chest pain on breathing: Secondary | ICD-10-CM

## 2013-06-05 NOTE — Patient Instructions (Signed)
Use blood pressure medication as needed.  Follow-up in 1 year.

## 2013-06-05 NOTE — Progress Notes (Signed)
OFFICE NOTE  Chief Complaint:  Annual follow-up  Primary Care Physician: Candice Lofts, MD  HPI:  Candice Gray  is a 58 year old female with a history of hypertension, however, it seems to be somewhat situational and provoked by anxiety. In fact, she tends to chase her blood pressures, taking medications only as needed. Most of the time actually she has no evidence of elevated blood pressure and very rarely takes medications. One can argue this is maybe only situational hypertension. She has also had an unusual hyponatremia and is followed by Candice Gray, as you know, for this issue. Overall other than some anxiety no specific complaints today.  Notable Raynaud's phenomenon which is apparent in both hands. There is additionally blushing of both cheeks which could suggest vasodilatation. I understand she has had workup by rheumatologist and has had other connective tissue disorders such as lupus ruled out.  PMHx:  Past Medical History  Diagnosis Date  . Hyperlipemia   . Diverticulitis   . Diverticulosis   . HTN (hypertension)   . MVP (mitral valve prolapse)   . SIADH (syndrome of inappropriate ADH production)   . Deviated nasal septum   . Hyperplastic colon polyp   . Anxiety   . Hyponatremia   . Raynaud phenomenon     Past Surgical History  Procedure Laterality Date  . Cryotherapy      for cervical dysplasia  . Transthoracic echocardiogram  11/2007    EF=>55%; mild MR; trace TR  . Nm myocar perf wall motion  11/2007    bruce myoview; perfusion defect in anterior myocardium (breast attenuation); post-stress EF 77%; normal, low risk study     FAMHx:  Family History  Problem Relation Age of Onset  . Hyperlipidemia Father   . Hypertension Father   . Diabetes Father   . Diabetes Paternal Grandmother   . Cancer Paternal Grandfather     brain tumor  . Hyperlipidemia Other   . Hypertension Other   . Colon cancer Neg Hx   . Coronary artery disease Maternal Grandmother   .  Hyperlipidemia Brother     SOCHx:   reports that she quit smoking about 15 years ago. Her smoking use included Cigarettes. She smoked 0.00 packs per day. She has never used smokeless tobacco. She reports that she drinks alcohol. She reports that she uses illicit drugs (Marijuana).  ALLERGIES:  Allergies  Allergen Reactions  . Azelastine Hcl     REACTION: increased bp  . Miralax [Polyethylene Glycol] Itching  . Red Dye Swelling    ROS: A comprehensive review of systems was negative except for: Cardiovascular: positive for labile hypertension, chest wall pain Endocrine: positive for hyponatremia, Raynaud's phenomenon  HOME MEDS: Current Outpatient Prescriptions  Medication Sig Dispense Refill  . amLODipine (NORVASC) 2.5 MG tablet Take 2.5 mg by mouth daily as needed (based on BP).       . Biotin 1 MG CAPS Take 1 tablet by mouth daily.      . Calcium-Vitamin D (CALTRATE 600 PLUS-VIT D PO) Take 1 tablet by mouth daily.      . Cholecalciferol (VITAMIN D-3) 1000 UNITS CAPS Take 1,000 Units by mouth daily.      Marland Kitchen ibandronate (BONIVA) 150 MG tablet Take 1 tablet (150 mg total) by mouth every 30 (thirty) days. Take in the morning with a full glass of water, on an empty stomach, and do not take anything else by mouth or lie down for the next 30 min.  3 tablet  3  . metoprolol succinate (TOPROL-XL) 25 MG 24 hr tablet Take 25 mg by mouth daily as needed (based on BP).       . vitamin Gray (ASCORBIC ACID) 500 MG tablet Take 500 mg by mouth daily.       No current facility-administered medications for this visit.    LABS/IMAGING: No results found for this or any previous visit (from the past 48 hour(s)). No results found.  VITALS: BP 132/90  Pulse 78  Ht 5\' 9"  (1.753 m)  Wt 131 lb 3.2 oz (59.512 kg)  BMI 19.37 kg/m2  EXAM: General appearance: alert and no distress Neck: no carotid bruit and no JVD Lungs: clear to auscultation bilaterally Heart: regular rate and rhythm, S1, S2 normal,  no murmur, click, rub or gallop Abdomen: soft, non-tender; bowel sounds normal; no masses,  no organomegaly Extremities: extremities normal, atraumatic, no cyanosis or edema Pulses: 2+ and symmetric Skin: Skin color, texture, turgor normal. No rashes or lesions Neurologic: Grossly normal Psych: Mood, affect normal  EKG: Sinus rhythm at 78  ASSESSMENT: 1. Labile hypertension, secondary to stress, anxiety and autonomic stimuli 2. Dyslipidemia - diet controlled 3. Atypical chest wall pain  PLAN: 1.   Candice Gray continues to do fairly well after identifying her blood pressure abnormality to be due to autonomic dysregulation. The best thing that we can do is treat sustained paroxysmal elevations in her blood pressure if she is symptomatic. Otherwise I would not recommend regular blood pressure medications. She should continue to work on healthy diet and maintaining adequate weight (actually she has been somewhat underweight, related to a recent bout of pneumonia).  We'll plan followup annually or sooner as necessary.  Pixie Casino, MD, [facility] Attending Cardiologist [facility]  Candice Gray,Candice Gray 06/05/2013, 9:59 AM

## 2013-07-06 ENCOUNTER — Telehealth: Payer: Self-pay | Admitting: Family Medicine

## 2013-07-06 NOTE — Telephone Encounter (Signed)
Sorry-I cannot take her

## 2013-07-06 NOTE — Telephone Encounter (Signed)
No problem, very nice lady

## 2013-07-06 NOTE — Telephone Encounter (Signed)
Pt would like to transfer from [facility] to Dr Glori Bickers. Is this ok with both of you?

## 2013-08-11 ENCOUNTER — Emergency Department (HOSPITAL_COMMUNITY)
Admission: EM | Admit: 2013-08-11 | Discharge: 2013-08-11 | Disposition: A | Discharge status: Nursing Home (Includes ICF) | Payer: BC Managed Care – PPO | Source: Home / Self Care | Attending: Family Medicine | Admitting: Family Medicine

## 2013-08-11 ENCOUNTER — Emergency Department (HOSPITAL_COMMUNITY): Payer: BC Managed Care – PPO

## 2013-08-11 ENCOUNTER — Encounter (HOSPITAL_COMMUNITY): Payer: Self-pay | Admitting: Emergency Medicine

## 2013-08-11 ENCOUNTER — Inpatient Hospital Stay (HOSPITAL_COMMUNITY)
Admission: EM | Admit: 2013-08-11 | Discharge: 2013-08-16 | DRG: 442 | Disposition: A | Discharge status: Home / Self Care | Payer: BC Managed Care – PPO | Attending: General Surgery | Admitting: General Surgery

## 2013-08-11 DIAGNOSIS — Z8249 Family history of ischemic heart disease and other diseases of the circulatory system: Secondary | ICD-10-CM

## 2013-08-11 DIAGNOSIS — I10 Essential (primary) hypertension: Secondary | ICD-10-CM | POA: Diagnosis present

## 2013-08-11 DIAGNOSIS — R778 Other specified abnormalities of plasma proteins: Secondary | ICD-10-CM

## 2013-08-11 DIAGNOSIS — E222 Syndrome of inappropriate secretion of antidiuretic hormone: Secondary | ICD-10-CM | POA: Diagnosis present

## 2013-08-11 DIAGNOSIS — S36116A Major laceration of liver, initial encounter: Secondary | ICD-10-CM | POA: Diagnosis present

## 2013-08-11 DIAGNOSIS — E871 Hypo-osmolality and hyponatremia: Secondary | ICD-10-CM

## 2013-08-11 DIAGNOSIS — E236 Other disorders of pituitary gland: Secondary | ICD-10-CM | POA: Diagnosis present

## 2013-08-11 DIAGNOSIS — S36113A Laceration of liver, unspecified degree, initial encounter: Secondary | ICD-10-CM

## 2013-08-11 DIAGNOSIS — R739 Hyperglycemia, unspecified: Secondary | ICD-10-CM

## 2013-08-11 DIAGNOSIS — E860 Dehydration: Secondary | ICD-10-CM

## 2013-08-11 DIAGNOSIS — W19XXXA Unspecified fall, initial encounter: Secondary | ICD-10-CM | POA: Diagnosis present

## 2013-08-11 DIAGNOSIS — I059 Rheumatic mitral valve disease, unspecified: Secondary | ICD-10-CM | POA: Diagnosis present

## 2013-08-11 DIAGNOSIS — R945 Abnormal results of liver function studies: Secondary | ICD-10-CM

## 2013-08-11 DIAGNOSIS — W1800XA Striking against unspecified object with subsequent fall, initial encounter: Secondary | ICD-10-CM

## 2013-08-11 DIAGNOSIS — R109 Unspecified abdominal pain: Secondary | ICD-10-CM

## 2013-08-11 DIAGNOSIS — R9431 Abnormal electrocardiogram [ECG] [EKG]: Secondary | ICD-10-CM

## 2013-08-11 DIAGNOSIS — R7989 Other specified abnormal findings of blood chemistry: Secondary | ICD-10-CM

## 2013-08-11 DIAGNOSIS — D62 Acute posthemorrhagic anemia: Secondary | ICD-10-CM | POA: Diagnosis not present

## 2013-08-11 DIAGNOSIS — R000 Tachycardia, unspecified: Secondary | ICD-10-CM

## 2013-08-11 DIAGNOSIS — I7300 Raynaud's syndrome without gangrene: Secondary | ICD-10-CM | POA: Diagnosis present

## 2013-08-11 DIAGNOSIS — Z79899 Other long term (current) drug therapy: Secondary | ICD-10-CM

## 2013-08-11 DIAGNOSIS — S2239XA Fracture of one rib, unspecified side, initial encounter for closed fracture: Secondary | ICD-10-CM | POA: Diagnosis present

## 2013-08-11 DIAGNOSIS — E785 Hyperlipidemia, unspecified: Secondary | ICD-10-CM | POA: Diagnosis present

## 2013-08-11 DIAGNOSIS — R110 Nausea: Secondary | ICD-10-CM

## 2013-08-11 DIAGNOSIS — S2231XA Fracture of one rib, right side, initial encounter for closed fracture: Secondary | ICD-10-CM | POA: Diagnosis present

## 2013-08-11 DIAGNOSIS — W010XXA Fall on same level from slipping, tripping and stumbling without subsequent striking against object, initial encounter: Secondary | ICD-10-CM | POA: Diagnosis present

## 2013-08-11 DIAGNOSIS — Z87891 Personal history of nicotine dependence: Secondary | ICD-10-CM

## 2013-08-11 DIAGNOSIS — R7309 Other abnormal glucose: Secondary | ICD-10-CM

## 2013-08-11 DIAGNOSIS — F1210 Cannabis abuse, uncomplicated: Secondary | ICD-10-CM | POA: Diagnosis present

## 2013-08-11 LAB — COMPREHENSIVE METABOLIC PANEL
ALBUMIN: 3.8 g/dL (ref 3.5–5.2)
ALT: 517 U/L — ABNORMAL HIGH (ref 0–35)
AST: 640 U/L — ABNORMAL HIGH (ref 0–37)
Alkaline Phosphatase: 66 U/L (ref 39–117)
BUN: 14 mg/dL (ref 6–23)
CO2: 20 mEq/L (ref 19–32)
CREATININE: 0.91 mg/dL (ref 0.50–1.10)
Calcium: 10 mg/dL (ref 8.4–10.5)
Chloride: 85 mEq/L — ABNORMAL LOW (ref 96–112)
GFR calc Af Amer: 80 mL/min — ABNORMAL LOW (ref >90)
GFR, EST NON AFRICAN AMERICAN: 69 mL/min — AB (ref >90)
Glucose, Bld: 376 mg/dL — ABNORMAL HIGH (ref 70–99)
Potassium: 4.3 mEq/L (ref 3.7–5.3)
Sodium: 129 mEq/L — ABNORMAL LOW (ref 137–147)
TOTAL PROTEIN: 7.9 g/dL (ref 6.0–8.3)
Total Bilirubin: 0.8 mg/dL (ref 0.3–1.2)

## 2013-08-11 LAB — CBC
HCT: 35.3 % — ABNORMAL LOW (ref 36.0–46.0)
HCT: 33.3 % — ABNORMAL LOW (ref 36.0–46.0)
HCT: 33.1 % — ABNORMAL LOW (ref 36.0–46.0)
HEMOGLOBIN: 12.3 g/dL (ref 12.0–15.0)
Hemoglobin: 11.7 g/dL — ABNORMAL LOW (ref 12.0–15.0)
Hemoglobin: 11.5 g/dL — ABNORMAL LOW (ref 12.0–15.0)
MCH: 34 pg (ref 26.0–34.0)
MCH: 33.8 pg (ref 26.0–34.0)
MCH: 33.7 pg (ref 26.0–34.0)
MCHC: 34.8 g/dL (ref 30.0–36.0)
MCHC: 35.1 g/dL (ref 30.0–36.0)
MCHC: 34.7 g/dL (ref 30.0–36.0)
MCV: 97.5 fL (ref 78.0–100.0)
MCV: 96.2 fL (ref 78.0–100.0)
MCV: 97.1 fL (ref 78.0–100.0)
Platelets: 179 10*3/uL (ref 150–400)
Platelets: 167 10*3/uL (ref 150–400)
Platelets: 92 10*3/uL — ABNORMAL LOW (ref 150–400)
RBC: 3.62 MIL/uL — ABNORMAL LOW (ref 3.87–5.11)
RBC: 3.46 MIL/uL — ABNORMAL LOW (ref 3.87–5.11)
RBC: 3.41 MIL/uL — ABNORMAL LOW (ref 3.87–5.11)
RDW: 13.2 % (ref 11.5–15.5)
RDW: 13 % (ref 11.5–15.5)
RDW: 13.2 % (ref 11.5–15.5)
WBC: 25.4 10*3/uL — ABNORMAL HIGH (ref 4.0–10.5)
WBC: 24.5 10*3/uL — AB (ref 4.0–10.5)
WBC: 22 10*3/uL — AB (ref 4.0–10.5)

## 2013-08-11 LAB — URINALYSIS, ROUTINE W REFLEX MICROSCOPIC
Bilirubin Urine: NEGATIVE
GLUCOSE, UA: 250 mg/dL — AB
KETONES UR: NEGATIVE mg/dL
Leukocytes, UA: NEGATIVE
Nitrite: NEGATIVE
PROTEIN: 100 mg/dL — AB
Specific Gravity, Urine: 1.02 (ref 1.005–1.030)
UROBILINOGEN UA: 0.2 mg/dL (ref 0.0–1.0)
pH: 7 (ref 5.0–8.0)

## 2013-08-11 LAB — POCT I-STAT, CHEM 8
BUN: 14 mg/dL (ref 6–23)
CALCIUM ION: 1.08 mmol/L — AB (ref 1.12–1.23)
CHLORIDE: 91 meq/L — AB (ref 96–112)
Creatinine, Ser: 1.1 mg/dL (ref 0.50–1.10)
Glucose, Bld: 371 mg/dL — ABNORMAL HIGH (ref 70–99)
HEMATOCRIT: 50 % — AB (ref 36.0–46.0)
Hemoglobin: 17 g/dL — ABNORMAL HIGH (ref 12.0–15.0)
POTASSIUM: 4.2 meq/L (ref 3.7–5.3)
Sodium: 129 mEq/L — ABNORMAL LOW (ref 137–147)
TCO2: 22 mmol/L (ref 0–100)

## 2013-08-11 LAB — CBC WITH DIFFERENTIAL/PLATELET
BASOS ABS: 0 10*3/uL (ref 0.0–0.1)
BASOS PCT: 0 % (ref 0–1)
EOS ABS: 0 10*3/uL (ref 0.0–0.7)
EOS PCT: 0 % (ref 0–5)
HEMATOCRIT: 43.6 % (ref 36.0–46.0)
Hemoglobin: 15.1 g/dL — ABNORMAL HIGH (ref 12.0–15.0)
Lymphocytes Relative: 5 % — ABNORMAL LOW (ref 12–46)
Lymphs Abs: 1.1 10*3/uL (ref 0.7–4.0)
MCH: 34.4 pg — ABNORMAL HIGH (ref 26.0–34.0)
MCHC: 34.6 g/dL (ref 30.0–36.0)
MCV: 99.3 fL (ref 78.0–100.0)
MONO ABS: 1.6 10*3/uL — AB (ref 0.1–1.0)
Monocytes Relative: 7 % (ref 3–12)
Neutro Abs: 21.8 10*3/uL — ABNORMAL HIGH (ref 1.7–7.7)
Neutrophils Relative %: 89 % — ABNORMAL HIGH (ref 43–77)
PLATELETS: 259 10*3/uL (ref 150–400)
RBC: 4.39 MIL/uL (ref 3.87–5.11)
RDW: 13.2 % (ref 11.5–15.5)
WBC: 24.6 10*3/uL — ABNORMAL HIGH (ref 4.0–10.5)

## 2013-08-11 LAB — LACTIC ACID, PLASMA
LACTIC ACID, VENOUS: 6.6 mmol/L — AB (ref 0.5–2.2)
Lactic Acid, Venous: 1.2 mmol/L (ref 0.5–2.2)

## 2013-08-11 LAB — TYPE AND SCREEN
ABO/RH(D): A POS
ANTIBODY SCREEN: NEGATIVE

## 2013-08-11 LAB — MRSA PCR SCREENING: MRSA by PCR: NEGATIVE

## 2013-08-11 LAB — ABO/RH: ABO/RH(D): A POS

## 2013-08-11 LAB — URINE MICROSCOPIC-ADD ON

## 2013-08-11 LAB — MAGNESIUM: Magnesium: 1.8 mg/dL (ref 1.5–2.5)

## 2013-08-11 LAB — PROTIME-INR
INR: 1.21 (ref 0.00–1.49)
Prothrombin Time: 15 seconds (ref 11.6–15.2)

## 2013-08-11 LAB — GLUCOSE, CAPILLARY
Glucose-Capillary: 161 mg/dL — ABNORMAL HIGH (ref 70–99)
Glucose-Capillary: 129 mg/dL — ABNORMAL HIGH (ref 70–99)
Glucose-Capillary: 155 mg/dL — ABNORMAL HIGH (ref 70–99)

## 2013-08-11 LAB — TROPONIN I: Troponin I: 0.73 ng/mL (ref <0.30)

## 2013-08-11 LAB — LIPASE, BLOOD: Lipase: 30 U/L (ref 11–59)

## 2013-08-11 MED ORDER — SODIUM CHLORIDE 0.9 % IV SOLN: INTRAVENOUS | Status: DC

## 2013-08-11 MED ORDER — MORPHINE SULFATE 2 MG/ML IJ SOLN: 2.0000 mg | INTRAMUSCULAR | Status: DC | PRN

## 2013-08-11 MED ORDER — SODIUM CHLORIDE 0.9 % IV SOLN
INTRAVENOUS | Status: DC
  Administered 2013-08-11 (×3): via INTRAVENOUS

## 2013-08-11 MED ORDER — SODIUM CHLORIDE 0.9 % IV BOLUS (SEPSIS)
1000.0000 mL | Freq: Once | INTRAVENOUS | Status: AC
  Administered 2013-08-11: 1000 mL via INTRAVENOUS

## 2013-08-11 MED ORDER — IOHEXOL 300 MG/ML  SOLN
100.0000 mL | Freq: Once | INTRAMUSCULAR | Status: AC | PRN
Start: 2013-08-11 — End: 2013-08-11
  Administered 2013-08-11: 100 mL via INTRAVENOUS

## 2013-08-11 MED ORDER — INSULIN ASPART 100 UNIT/ML ~~LOC~~ SOLN
0.0000 [IU] | Freq: Three times a day (TID) | SUBCUTANEOUS | Status: DC
  Administered 2013-08-11: 2 [IU] via SUBCUTANEOUS

## 2013-08-11 MED ORDER — ONDANSETRON HCL 4 MG/2ML IJ SOLN
INTRAMUSCULAR | Status: AC
  Filled 2013-08-11: qty 2

## 2013-08-11 MED ORDER — SODIUM CHLORIDE 0.9 % IV SOLN
Freq: Once | INTRAVENOUS | Status: AC
  Administered 2013-08-11: 11:00:00 via INTRAVENOUS

## 2013-08-11 MED ORDER — FENTANYL CITRATE 0.05 MG/ML IJ SOLN
50.0000 ug | INTRAMUSCULAR | Status: AC | PRN
  Administered 2013-08-12 (×2): 50 ug via INTRAVENOUS
  Filled 2013-08-11 (×3): qty 2

## 2013-08-11 MED ORDER — PANTOPRAZOLE SODIUM 40 MG IV SOLR
40.0000 mg | Freq: Every day | INTRAVENOUS | Status: DC
  Administered 2013-08-11 – 2013-08-12 (×2): 40 mg via INTRAVENOUS
  Filled 2013-08-11 (×4): qty 40

## 2013-08-11 MED ORDER — SODIUM CHLORIDE 0.9 % IV SOLN
Freq: Once | INTRAVENOUS | Status: AC
  Administered 2013-08-11: 12:00:00 via INTRAVENOUS

## 2013-08-11 MED ORDER — INSULIN ASPART 100 UNIT/ML ~~LOC~~ SOLN
0.0000 [IU] | SUBCUTANEOUS | Status: DC
Start: 2013-08-12 — End: 2013-08-12
  Administered 2013-08-12 (×2): 1 [IU] via SUBCUTANEOUS

## 2013-08-11 MED ORDER — ONDANSETRON HCL 4 MG/2ML IJ SOLN
4.0000 mg | Freq: Four times a day (QID) | INTRAMUSCULAR | Status: DC | PRN
Start: 2013-08-11 — End: 2013-08-16
  Administered 2013-08-11 – 2013-08-12 (×2): 4 mg via INTRAVENOUS
  Filled 2013-08-11 (×2): qty 2

## 2013-08-11 MED ORDER — ONDANSETRON HCL 4 MG/2ML IJ SOLN
4.0000 mg | Freq: Once | INTRAMUSCULAR | Status: AC
  Administered 2013-08-11: 4 mg via INTRAVENOUS

## 2013-08-11 NOTE — ED Notes (Signed)
Patient at [facility], accompanied by RN

## 2013-08-11 NOTE — Consult Note (Signed)
PULMONARY / CRITICAL CARE MEDICINE   Name: Candice Gray MRN: [id number] DOB: 16-Nov-1955    ADMISSION DATE:  08/11/2013 CONSULTATION DATE:  08/11/2013  REFERRING MD :  Trauma PRIMARY SERVICE: Trauma  CHIEF COMPLAINT:   SIADH, liver laceration with fall  BRIEF PATIENT DESCRIPTION:  58 year old female who fell and hit the side of her chest and abdomen on a dresser resulting in rib fractures and liver laceration. Patient has SIADH and critical care was consult to assess the electrolyte disturbances  SIGNIFICANT EVENTS / STUDIES:  08/11/2013 CT scan of abdomen showing liver laceration with contained subcapsular hematoma  LINES / TUBES: PIV  CULTURES: None  ANTIBIOTICS: None  HISTORY OF PRESENT ILLNESS:   This is a 58 year old female who has a history of hyperlipidemia, hypertension, SIADH, with chronic hyponatremia. This patient got up in the middle of night last night in the dark tripped over a television hitting the side of a dresser resulting in injury to the abdomen she presented on the delayed basis or in the emergency room was found to have a liver laceration with intra-abdominal retroperitoneal bleeding that was contained. There was no extravasation of blood into the peritoneal cavity. There were fractures associated with this injury as well the right. The trauma service is admitting the patient wished a critical care medicine consult because of the sodium level of 129.  The patient is followed by endocrinology for SIADH. Her treatment has been fluid restriction only. Her sodium levels fluctuated between 128 and 133 over the past year. 129 is a typical level for this patient.    PAST MEDICAL HISTORY :  Past Medical History  Diagnosis Date  . Hyperlipemia   . Diverticulitis   . Diverticulosis   . HTN (hypertension)   . MVP (mitral valve prolapse)   . SIADH (syndrome of inappropriate ADH production)   . Deviated nasal septum   . Hyperplastic colon polyp   . Anxiety   .  Hyponatremia   . Raynaud phenomenon   . Hyponatremia    Past Surgical History  Procedure Laterality Date  . Cryotherapy      for cervical dysplasia  . Transthoracic echocardiogram  11/2007    EF=>55%; mild MR; trace TR  . Nm myocar perf wall motion  11/2007    bruce myoview; perfusion defect in anterior myocardium (breast attenuation); post-stress EF 77%; normal, low risk study    Prior to Admission medications   Medication Sig Start Date End Date Taking? Authorizing Provider  amLODipine (NORVASC) 2.5 MG tablet Take 2.5 mg by mouth daily as needed (based on BP).    Yes Historical Provider, MD  Biotin 1 MG CAPS Take 1 tablet by mouth daily.   Yes Historical Provider, MD  Calcium-Vitamin D (CALTRATE 600 PLUS-VIT D PO) Take 1 tablet by mouth daily.   Yes Historical Provider, MD  ibandronate (BONIVA) 150 MG tablet Take 1 tablet (150 mg total) by mouth every 30 (thirty) days. Take in the morning with a full glass of water, on an empty stomach, and do not take anything else by mouth or lie down for the next 30 min. 04/09/13  Yes Amy E Bedsole, MD  latanoprost (XALATAN) 0.005 % ophthalmic solution Place 1 drop into both eyes at bedtime.   Yes Historical Provider, MD  vitamin C (ASCORBIC ACID) 500 MG tablet Take 500 mg by mouth daily.   Yes Historical Provider, MD  metoprolol succinate (TOPROL-XL) 25 MG 24 hr tablet Take 25 mg by mouth daily as  needed (based on BP).     Historical Provider, MD   Allergies  Allergen Reactions  . Azelastine Hcl     REACTION: increased bp  . Miralax [Polyethylene Glycol] Itching  . Red Dye Swelling  . Zyban [Bupropion] Swelling    FAMILY HISTORY:  Family History  Problem Relation Age of Onset  . Hyperlipidemia Father   . Hypertension Father   . Diabetes Father   . Diabetes Paternal Grandmother   . Cancer Paternal Grandfather     brain tumor  . Hyperlipidemia Other   . Hypertension Other   . Colon cancer Neg Hx   . Coronary artery disease Maternal  Grandmother   . Hyperlipidemia Brother    SOCIAL HISTORY:  reports that she quit smoking about 15 years ago. Her smoking use included Cigarettes. She smoked 0.00 packs per day. She has never used smokeless tobacco. She reports that she drinks about 8.4 ounces of alcohol per week. She reports that she uses illicit drugs (Marijuana).  REVIEW OF SYSTEMS:   Constitutional:   No  weight loss, night sweats,  Fevers, chills, fatigue, lassitude. HEENT:   No headaches,  Difficulty swallowing,  Tooth/dental problems,  Sore throat,                No sneezing, itching, ear ache, nasal congestion, post nasal drip,   CV:  No chest pain,  Orthopnea, PND, swelling in lower extremities, anasarca, dizziness, palpitations  GI  No heartburn, indigestion, abdominal pain, nausea, vomiting, diarrhea, change in bowel habits, loss of appetite  Resp: No shortness of breath with exertion or at rest.  No excess mucus, no productive cough,  No non-productive cough,  No coughing up of blood.  No change in color of mucus.  No wheezing.  No chest wall deformity  Skin: no rash or lesions.  GU: no dysuria, change in color of urine, no urgency or frequency.  No flank pain.  MS:  No joint pain or swelling.  No decreased range of motion.  No back pain.  Psych:  No change in mood or affect. No depression or anxiety.  No memory loss.   SUBJECTIVE:   VITAL SIGNS: Temp:  [97.6 F (36.4 C)-98.3 F (36.8 C)] 98.3 F (36.8 C) (03/21 1229) Pulse Rate:  [96-152] 118 (03/21 1430) Resp:  [19-26] 19 (03/21 1430) BP: (128-185)/(91-128) 185/117 mmHg (03/21 1430) SpO2:  [95 %-100 %] 100 % (03/21 1430) Weight:  [56.7 kg (125 lb)] 56.7 kg (125 lb) (03/21 1209) HEMODYNAMICS:   VENTILATOR SETTINGS:   INTAKE / OUTPUT: Intake/Output     03/20 0701 - 03/21 0700 03/21 0701 - 03/22 0700   I.V. (mL/kg)  2000 (35.3)   Total Intake(mL/kg)  2000 (35.3)   Net   +2000          PHYSICAL EXAMINATION: General:  No acute  distress Neuro:  Awake and alert moves all fours HEENT:  Dry mucous membranes Cardiovascular:  Resting tachycardia normal S1-S2 Lungs:  Clear Abdomen:  Tender right upper quadrant Musculoskeletal:  Full range of motion Skin:  Clear  LABS:  CBC  Recent Labs Lab 08/11/13 1126 08/11/13 1229  WBC  --  24.6*  HGB 17.0* 15.1*  HCT 50.0* 43.6  PLT  --  259   Coag's No results found for this basename: APTT, INR,  in the last 168 hours BMET  Recent Labs Lab 08/11/13 1126 08/11/13 1229  NA 129* 129*  K 4.2 4.3  CL 91* 85*  CO2  --  20  BUN 14 14  CREATININE 1.10 0.91  GLUCOSE 371* 376*   Electrolytes  Recent Labs Lab 08/11/13 1229  CALCIUM 10.0  MG 1.8   Sepsis Markers  Recent Labs Lab 08/11/13 1234  LATICACIDVEN 6.6*   ABG No results found for this basename: PHART, PCO2ART, PO2ART,  in the last 168 hours Liver Enzymes  Recent Labs Lab 08/11/13 1229  AST 640*  ALT 517*  ALKPHOS 66  BILITOT 0.8  ALBUMIN 3.8   Cardiac Enzymes  Recent Labs Lab 08/11/13 1229  TROPONINI 0.73*   Glucose No results found for this basename: GLUCAP,  in the last 168 hours  Imaging Ct Head Wo Contrast  08/11/2013   CLINICAL DATA:  Fall  EXAM: CT HEAD WITHOUT CONTRAST  CT CERVICAL SPINE WITHOUT CONTRAST  TECHNIQUE: Multidetector CT imaging of the head and cervical spine was performed following the standard protocol without intravenous contrast. Multiplanar CT image reconstructions of the cervical spine were also generated.  COMPARISON:  CT HEAD W/O CM dated 03/12/2010; CT ABD/PELVIS W CM dated 08/11/2013  FINDINGS: CT HEAD FINDINGS  Chronic ischemic changes. Mild global atrophy. No mass effect, midline shift, or acute intracranial hemorrhage. Mastoid air cells and visualized paranasal sinuses are clear. Intact cranium  CT CERVICAL SPINE FINDINGS  Anatomic alignment. No acute fracture. No obvious soft tissue injury. Normal thyroid gland. No evidence of spinal hemorrhage.   IMPRESSION: No acute intracranial pathology.  No evidence of acute bony injury in the cervical spine.   Electronically Signed   By: Maryclare Bean M.D.   On: 08/11/2013 13:29   Ct Chest W Contrast  08/11/2013   CLINICAL DATA:  Fall  Fall  EXAM: CT CHEST, ABDOMEN, AND PELVIS WITH CONTRAST  TECHNIQUE: Multidetector CT imaging of the chest, abdomen and pelvis was performed following the standard protocol during bolus administration of intravenous contrast.  CONTRAST:  132mL OMNIPAQUE IOHEXOL 300 MG/ML  SOLN  COMPARISON:  DG CHEST 1V PORT dated 08/11/2013; CT CHEST W/CM dated 03/12/2010  FINDINGS: CT CHEST FINDINGS  No evidence of mediastinal hemorrhage or abnormal adenopathy. No evidence of aortic injury.  No pneumothorax.  No pleural effusion.  Right lower lobe calcified granuloma on image 22 is stable. Hazy scarring in the adjacent right upper lobe on image 22. Linear scarring at the right lung and left lung bases. Hazy airspace opacities at the dependent left lung base are present. These were noted previously, and overall, they are improved.  No acute bony deformity.  CT ABDOMEN AND PELVIS FINDINGS  There is a large laceration in the liver extending to the capsule of the dome from measuring 6.0 x 12.2 x 9.8 cm. There is associated intraperitoneal hemorrhage surrounding the liver as well as the spleen. There is no delayed contrast pooling to suggest acute extravasation.  Small lesion in the medial segment of the left lobe of the liver on image 76 of series 3 is heterogeneous with hypodensity and enhancement. This may simply represent a small hemangioma.  Spleen, gallbladder, pancreas, adrenal glands are within normal limits.  Kidneys are lobulated. Prominent cystic structure in the central left kidney represents an extrarenal pelvis. No evidence of renal injury.  Small hiatal hernia.  There is moderate intraperitoneal hemorrhage within the lower abdomen and pelvis.  Bladder and uterus are within normal limits. Adnexa  are within normal limits otherwise.  No acute bony deformity.  IMPRESSION: No evidence of acute intrathoracic injury.  Large liver laceration associated with a large amount of intraperitoneal  hemorrhage.  Critical Value/emergent results were called by telephone at the time of interpretation on 08/11/2013 at 1:39 PM to Dr. Francine Graven , who verbally acknowledged these results.   Electronically Signed   By: Maryclare Bean M.D.   On: 08/11/2013 13:40   Ct Cervical Spine Wo Contrast  08/11/2013   CLINICAL DATA:  Fall  EXAM: CT HEAD WITHOUT CONTRAST  CT CERVICAL SPINE WITHOUT CONTRAST  TECHNIQUE: Multidetector CT imaging of the head and cervical spine was performed following the standard protocol without intravenous contrast. Multiplanar CT image reconstructions of the cervical spine were also generated.  COMPARISON:  CT HEAD W/O CM dated 03/12/2010; CT ABD/PELVIS W CM dated 08/11/2013  FINDINGS: CT HEAD FINDINGS  Chronic ischemic changes. Mild global atrophy. No mass effect, midline shift, or acute intracranial hemorrhage. Mastoid air cells and visualized paranasal sinuses are clear. Intact cranium  CT CERVICAL SPINE FINDINGS  Anatomic alignment. No acute fracture. No obvious soft tissue injury. Normal thyroid gland. No evidence of spinal hemorrhage.  IMPRESSION: No acute intracranial pathology.  No evidence of acute bony injury in the cervical spine.   Electronically Signed   By: Maryclare Bean M.D.   On: 08/11/2013 13:29   Ct Abdomen Pelvis W Contrast  08/11/2013   CLINICAL DATA:  Fall  Fall  EXAM: CT CHEST, ABDOMEN, AND PELVIS WITH CONTRAST  TECHNIQUE: Multidetector CT imaging of the chest, abdomen and pelvis was performed following the standard protocol during bolus administration of intravenous contrast.  CONTRAST:  164mL OMNIPAQUE IOHEXOL 300 MG/ML  SOLN  COMPARISON:  DG CHEST 1V PORT dated 08/11/2013; CT CHEST W/CM dated 03/12/2010  FINDINGS: CT CHEST FINDINGS  No evidence of mediastinal hemorrhage or abnormal  adenopathy. No evidence of aortic injury.  No pneumothorax.  No pleural effusion.  Right lower lobe calcified granuloma on image 22 is stable. Hazy scarring in the adjacent right upper lobe on image 22. Linear scarring at the right lung and left lung bases. Hazy airspace opacities at the dependent left lung base are present. These were noted previously, and overall, they are improved.  No acute bony deformity.  CT ABDOMEN AND PELVIS FINDINGS  There is a large laceration in the liver extending to the capsule of the dome from measuring 6.0 x 12.2 x 9.8 cm. There is associated intraperitoneal hemorrhage surrounding the liver as well as the spleen. There is no delayed contrast pooling to suggest acute extravasation.  Small lesion in the medial segment of the left lobe of the liver on image 76 of series 3 is heterogeneous with hypodensity and enhancement. This may simply represent a small hemangioma.  Spleen, gallbladder, pancreas, adrenal glands are within normal limits.  Kidneys are lobulated. Prominent cystic structure in the central left kidney represents an extrarenal pelvis. No evidence of renal injury.  Small hiatal hernia.  There is moderate intraperitoneal hemorrhage within the lower abdomen and pelvis.  Bladder and uterus are within normal limits. Adnexa are within normal limits otherwise.  No acute bony deformity.  IMPRESSION: No evidence of acute intrathoracic injury.  Large liver laceration associated with a large amount of intraperitoneal hemorrhage.  Critical Value/emergent results were called by telephone at the time of interpretation on 08/11/2013 at 1:39 PM to Dr. Francine Graven , who verbally acknowledged these results.   Electronically Signed   By: Maryclare Bean M.D.   On: 08/11/2013 13:40   Dg Chest Port 1 View  08/11/2013   CLINICAL DATA:  Fall  EXAM: PORTABLE CHEST -  1 VIEW  COMPARISON:  01/09/2013  FINDINGS: Normal heart size.  Lungs hyper aerated and clear.  No pneumothorax.  IMPRESSION: No  active disease.   Electronically Signed   By: Maryclare Bean M.D.   On: 08/11/2013 13:21     CXR: No active process  ASSESSMENT / PLAN:  PULMONARY A: No active process P:   Monitor  CARDIOVASCULAR A: Resting tachycardia with mild anemia from bleeding History of hypertension P:  Hold antihypertensive meds for now Monitor  RENAL A:  SIADH with chronic hyponatremia P:   For now would administer normal saline IV fluids and monitor electrolytes and a daily basis. Would also tend to fluid restrict this patient however given the bleeding process this takes precedence over fluid restriction  GASTROINTESTINAL A:  Liver laceration from trauma P:   We'll administer proton pump inhibitor IV Per trauma team  HEMATOLOGIC A:  Acute blood loss anemia P:  Transfuse per trauma  INFECTIOUS A:  No active issues P:   Monitor  ENDOCRINE A:  SIADH   P:   As above  NEUROLOGIC A:  Pain from injury P:   Pain control  TODAY'S SUMMARY:  58 year-old female with liver laceration from a fall with rib fractures on the right. Patient has chronic SIADH which was managed with normal saline IV fluids and followup of BMET  I have personally obtained a history, examined the patient, evaluated laboratory and imaging results, formulated the assessment and plan and placed orders. CRITICAL CARE: The patient is critically ill with multiple organ systems failure and requires high complexity decision making for assessment and support, frequent evaluation and titration of therapies, application of advanced monitoring technologies and extensive interpretation of multiple databases. Critical Care Time devoted to patient care services described in this note is 30 minutes.   Mariel Sleet Beeper  [phone]  Cell  [phone]  If no response or cell goes to voicemail, call beeper [phone]  Pulmonary and [place] Pager: [phone]  08/11/2013, 2:38 PM

## 2013-08-11 NOTE — ED Provider Notes (Signed)
CSN: [id number]     Arrival date & time 08/11/13  1100 History   First MD Initiated Contact with Patient 08/11/13 1103     Chief Complaint  Patient presents with  . Fall  . Emesis  . Weakness   (Consider location/radiation/quality/duration/timing/severity/associated sxs/prior Treatment) HPI Comments: Patient and husband report that she suffered a mechanic fall last night (tripped over TV that has been placed on the floor and landed on a speaker cabinet). She landed on the cabinet on her right side. Does not report loss of consciousness or known head trauma, however, this morning she was noticed to have small superficial laceration to right ear. When she woke this morning she felt excessively thirsty, began having nausea and vomiting, and was having persistent right upper quadrant abdominal pain and right lateral chest wall pain. Husband brings her to [facility] for evaluation.   The history is provided by the patient and the spouse.    Past Medical History  Diagnosis Date  . Hyperlipemia   . Diverticulitis   . Diverticulosis   . HTN (hypertension)   . MVP (mitral valve prolapse)   . SIADH (syndrome of inappropriate ADH production)   . Deviated nasal septum   . Hyperplastic colon polyp   . Anxiety   . Hyponatremia   . Raynaud phenomenon   . Hyponatremia    Past Surgical History  Procedure Laterality Date  . Cryotherapy      for cervical dysplasia  . Transthoracic echocardiogram  11/2007    EF=>55%; mild MR; trace TR  . Nm myocar perf wall motion  11/2007    bruce myoview; perfusion defect in anterior myocardium (breast attenuation); post-stress EF 77%; normal, low risk study    Family History  Problem Relation Age of Onset  . Hyperlipidemia Father   . Hypertension Father   . Diabetes Father   . Diabetes Paternal Grandmother   . Cancer Paternal Grandfather     brain tumor  . Hyperlipidemia Other   . Hypertension Other   . Colon cancer Neg Hx   . Coronary artery disease Maternal  Grandmother   . Hyperlipidemia Brother    History  Substance Use Topics  . Smoking status: Former Smoker    Types: Cigarettes    Quit date: 06/05/1998  . Smokeless tobacco: Never Used  . Alcohol Use: 8.4 oz/week    14 Cans of beer per week     Comment: daily   OB History   Grav Para Term Preterm Abortions TAB SAB Ect Mult Living                 Review of Systems  Constitutional: Positive for chills.  HENT: Negative.   Eyes: Negative.   Respiratory: Negative.   Cardiovascular:       +right lateral chest wall discomfort  Gastrointestinal: Positive for nausea, vomiting and abdominal pain. Negative for diarrhea.  Endocrine: Positive for polydipsia. Negative for polyphagia and polyuria.  Genitourinary: Negative.   Musculoskeletal: Positive for neck stiffness.  Skin: Positive for pallor.  Allergic/Immunologic: Negative for immunocompromised state.  Neurological: Positive for weakness.  Psychiatric/Behavioral: Negative.     Allergies  Azelastine hcl; Miralax; and Red dye  Home Medications   Current Outpatient Rx  Name  Route  Sig  Dispense  Refill  . amLODipine (NORVASC) 2.5 MG tablet   Oral   Take 2.5 mg by mouth daily as needed (based on BP).          . Biotin 1 MG  CAPS   Oral   Take 1 tablet by mouth daily.         . Calcium-Vitamin D (CALTRATE 600 PLUS-VIT D PO)   Oral   Take 1 tablet by mouth daily.         Marland Kitchen ibandronate (BONIVA) 150 MG tablet   Oral   Take 1 tablet (150 mg total) by mouth every 30 (thirty) days. Take in the morning with a full glass of water, on an empty stomach, and do not take anything else by mouth or lie down for the next 30 min.   3 tablet   3   . metoprolol succinate (TOPROL-XL) 25 MG 24 hr tablet   Oral   Take 25 mg by mouth daily as needed (based on BP).          . vitamin C (ASCORBIC ACID) 500 MG tablet   Oral   Take 500 mg by mouth daily.         . Cholecalciferol (VITAMIN D-3) 1000 UNITS CAPS   Oral   Take  1,000 Units by mouth daily.          BP 128/91  Pulse 145  Temp(Src) 97.7 F (36.5 C)  Resp 24  SpO2 100% Physical Exam  Nursing note and vitals reviewed. Constitutional: She is oriented to person, place, and time. She appears well-developed and well-nourished.  HENT:  Head: Normocephalic. Head is with abrasion.  Right Ear: Hearing, tympanic membrane and ear canal normal. No hemotympanum.  Left Ear: Hearing, tympanic membrane, external ear and ear canal normal.  Nose: Nose normal.  Mouth/Throat: Uvula is midline. Mucous membranes are dry.  Small superficial linear abrasion at helix of right ear.  Eyes: Conjunctivae are normal. No scleral icterus.  Neck: Normal range of motion, full passive range of motion without pain and phonation normal. Neck supple. No spinous process tenderness and no muscular tenderness present.  Cardiovascular: Normal heart sounds.  Tachycardia present.   Pulmonary/Chest: Effort normal and breath sounds normal. No respiratory distress. She has no wheezes. She exhibits tenderness.  Tenderness at right lateral lower chest wall with associated hematoma. No crepitus, SQ emphysema.  Abdominal: Soft. Normal appearance. Bowel sounds are decreased. There is tenderness in the right upper quadrant. There is no CVA tenderness.  Linear right flank hematoma  Musculoskeletal: Normal range of motion.  Neurological: She is alert and oriented to person, place, and time.  Skin: Skin is warm and dry. There is pallor.  Psychiatric: She has a normal mood and affect. Her behavior is normal.    ED Course  Procedures (including critical care time) Labs Review Labs Reviewed  POCT I-STAT, CHEM 8 - Abnormal; Notable for the following:    Sodium 129 (*)    Chloride 91 (*)    Glucose, Bld 371 (*)    Calcium, Ion 1.08 (*)    Hemoglobin 17.0 (*)    HCT 50.0 (*)    All other components within normal limits   Imaging Review No results found.   MDM   1. Dehydration   2.  Abdominal pain   3. Tachycardia   4. Hyponatremia   5. Hyperglycemia    58 y/o female with persistent right upper quadrant abdominal pain, N/V, and associated flank hematoma following mechanical fall yesterday. Now with tachycardia, excessive thirst, hyponatremia, and hyperglycemia. BP 128/91. Two IV's placed, zofran given. Upon [facility] arrival, patient began to develop hypotension with SBP in mid 90's. [place] attending (Dr. Doy Mince) and provided  report regarding patient. Patient transferred via [facility] to [place], [place] 08/11/13 1208

## 2013-08-11 NOTE — ED Notes (Signed)
Report called to Olivia Mackie, ED Charge RN per K. Lapan-Hutchens, RN.

## 2013-08-11 NOTE — ED Notes (Signed)
Reports mechanical fall last night when walking in dark; hit right ribs on a speaker; light ecchymosis noted to right lateral ribcage.  Denies LOC or head injury, but small scratch noted to right ear.  Last night pt began feeling weak.  This morning started vomiting - unable to keep anything down.  Pt's hands clammy.  Denies any SOB, chest pain, dizziness.  C/O mild dull ache to mid-to-low abdomen.  Denies any diarrhea.  Has hx hyponatremia - was told to get seen immediately if any similar sxs - pt states it feels similar.  C/O right neck discomfort.

## 2013-08-11 NOTE — ED Notes (Signed)
Patient has 2 18 gauge IVs

## 2013-08-11 NOTE — ED Notes (Signed)
Spoke with Dr Donne Hazel concerning patients HR (was up to 135 at time of page), no new orders at this time.

## 2013-08-11 NOTE — Progress Notes (Signed)
Pt's BP intermittently 170's-180's/100's-110's with HR 110's-120's.  Pt has no c/o pain.  MD Donne Hazel) notified.  No new orders given at this time.  Will continue to monitor.

## 2013-08-11 NOTE — H&P (Signed)
Candice Gray is an 58 y.o. female.   Chief Complaint: s/p fall HPI: 28 yof with multiple medical issues but fairly healthy, on no anticoagulants, fell last night at about 730pm onto a speaker on right side. Just has not felt well.  Some nausea.  Went to urgent care today to see if sodium was low and she was referred to er.  She has no real complaints except some pain on her right side when I see her.   Past Medical History  Diagnosis Date  . Hyperlipemia   . Diverticulitis   . Diverticulosis   . HTN (hypertension)   . MVP (mitral valve prolapse)   . SIADH (syndrome of inappropriate ADH production)   . Deviated nasal septum   . Hyperplastic colon polyp   . Anxiety   . Hyponatremia   . Raynaud phenomenon   . Hyponatremia     Past Surgical History  Procedure Laterality Date  . Cryotherapy      for cervical dysplasia  . Transthoracic echocardiogram  11/2007    EF=>55%; mild MR; trace TR  . Nm myocar perf wall motion  11/2007    bruce myoview; perfusion defect in anterior myocardium (breast attenuation); post-stress EF 77%; normal, low risk study     Family History  Problem Relation Age of Onset  . Hyperlipidemia Father   . Hypertension Father   . Diabetes Father   . Diabetes Paternal Grandmother   . Cancer Paternal Grandfather     brain tumor  . Hyperlipidemia Other   . Hypertension Other   . Colon cancer Neg Hx   . Coronary artery disease Maternal Grandmother   . Hyperlipidemia Brother    Social History:  reports that she quit smoking about 15 years ago. Her smoking use included Cigarettes. She smoked 0.00 packs per day. She has never used smokeless tobacco. She reports that she drinks about 8.4 ounces of alcohol per week. She reports that she uses illicit drugs (Marijuana).  Allergies:  Allergies  Allergen Reactions  . Azelastine Hcl     REACTION: increased bp  . Miralax [Polyethylene Glycol] Itching  . Red Dye Swelling  . Zyban [Bupropion] Swelling    meds  reviewed  Results for orders placed during the hospital encounter of 08/11/13 (from the past 48 hour(s))  CBC WITH DIFFERENTIAL     Status: Abnormal   Collection Time    08/11/13 12:29 PM      Result Value Ref Range   WBC 24.6 (*) 4.0 - 10.5 K/uL   RBC 4.39  3.87 - 5.11 MIL/uL   Hemoglobin 15.1 (*) 12.0 - 15.0 g/dL   HCT 43.6  36.0 - 46.0 %   MCV 99.3  78.0 - 100.0 fL   MCH 34.4 (*) 26.0 - 34.0 pg   MCHC 34.6  30.0 - 36.0 g/dL   RDW 13.2  11.5 - 15.5 %   Platelets 259  150 - 400 K/uL   Neutrophils Relative % 89 (*) 43 - 77 %   Neutro Abs 21.8 (*) 1.7 - 7.7 K/uL   Lymphocytes Relative 5 (*) 12 - 46 %   Lymphs Abs 1.1  0.7 - 4.0 K/uL   Monocytes Relative 7  3 - 12 %   Monocytes Absolute 1.6 (*) 0.1 - 1.0 K/uL   Eosinophils Relative 0  0 - 5 %   Eosinophils Absolute 0.0  0.0 - 0.7 K/uL   Basophils Relative 0  0 - 1 %   Basophils  Absolute 0.0  0.0 - 0.1 K/uL  COMPREHENSIVE METABOLIC PANEL     Status: Abnormal   Collection Time    08/11/13 12:29 PM      Result Value Ref Range   Sodium 129 (*) 137 - 147 mEq/L   Potassium 4.3  3.7 - 5.3 mEq/L   Chloride 85 (*) 96 - 112 mEq/L   CO2 20  19 - 32 mEq/L   Glucose, Bld 376 (*) 70 - 99 mg/dL   BUN 14  6 - 23 mg/dL   Creatinine, Ser 0.91  0.50 - 1.10 mg/dL   Calcium 10.0  8.4 - 10.5 mg/dL   Total Protein 7.9  6.0 - 8.3 g/dL   Albumin 3.8  3.5 - 5.2 g/dL   AST 640 (*) 0 - 37 U/L   ALT 517 (*) 0 - 35 U/L   Alkaline Phosphatase 66  39 - 117 U/L   Total Bilirubin 0.8  0.3 - 1.2 mg/dL   GFR calc non Af Amer 69 (*) >90 mL/min   GFR calc Af Amer 80 (*) >90 mL/min   Comment: (NOTE)     The eGFR has been calculated using the CKD EPI equation.     This calculation has not been validated in all clinical situations.     eGFR's persistently <90 mL/min signify possible Chronic Kidney     Disease.  LIPASE, BLOOD     Status: None   Collection Time    08/11/13 12:29 PM      Result Value Ref Range   Lipase 30  11 - 59 U/L  TROPONIN I      Status: Abnormal   Collection Time    08/11/13 12:29 PM      Result Value Ref Range   Troponin I 0.73 (*) <0.30 ng/mL   Comment:            Due to the release kinetics of cTnI,     a negative result within the first hours     of the onset of symptoms does not rule out     myocardial infarction with certainty.     If myocardial infarction is still suspected,     repeat the test at appropriate intervals.     CRITICAL RESULT CALLED TO, READ BACK BY AND VERIFIED WITH:     S SNIDER,RN 1350 08/11/13 SCALES H  MAGNESIUM     Status: None   Collection Time    08/11/13 12:29 PM      Result Value Ref Range   Magnesium 1.8  1.5 - 2.5 mg/dL  LACTIC ACID, PLASMA     Status: Abnormal   Collection Time    08/11/13 12:34 PM      Result Value Ref Range   Lactic Acid, Venous 6.6 (*) 0.5 - 2.2 mmol/L  TYPE AND SCREEN     Status: None   Collection Time    08/11/13  1:30 PM      Result Value Ref Range   ABO/RH(D) A POS     Antibody Screen NEG     Sample Expiration 08/14/2013    URINALYSIS, ROUTINE W REFLEX MICROSCOPIC     Status: Abnormal   Collection Time    08/11/13  1:35 PM      Result Value Ref Range   Color, Urine YELLOW  YELLOW   APPearance CLEAR  CLEAR   Specific Gravity, Urine 1.020  1.005 - 1.030   pH 7.0  5.0 - 8.0  Glucose, UA 250 (*) NEGATIVE mg/dL   Hgb urine dipstick SMALL (*) NEGATIVE   Bilirubin Urine NEGATIVE  NEGATIVE   Ketones, ur NEGATIVE  NEGATIVE mg/dL   Protein, ur 100 (*) NEGATIVE mg/dL   Urobilinogen, UA 0.2  0.0 - 1.0 mg/dL   Nitrite NEGATIVE  NEGATIVE   Leukocytes, UA NEGATIVE  NEGATIVE  URINE MICROSCOPIC-ADD ON     Status: Abnormal   Collection Time    08/11/13  1:35 PM      Result Value Ref Range   Squamous Epithelial / LPF FEW (*) RARE   RBC / HPF 0-2  <3 RBC/hpf   Casts HYALINE CASTS (*) NEGATIVE   Ct Head Wo Contrast  08/11/2013   CLINICAL DATA:  Fall  EXAM: CT HEAD WITHOUT CONTRAST  CT CERVICAL SPINE WITHOUT CONTRAST  TECHNIQUE: Multidetector CT  imaging of the head and cervical spine was performed following the standard protocol without intravenous contrast. Multiplanar CT image reconstructions of the cervical spine were also generated.  COMPARISON:  CT HEAD W/O CM dated 03/12/2010; CT ABD/PELVIS W CM dated 08/11/2013  FINDINGS: CT HEAD FINDINGS  Chronic ischemic changes. Mild global atrophy. No mass effect, midline shift, or acute intracranial hemorrhage. Mastoid air cells and visualized paranasal sinuses are clear. Intact cranium  CT CERVICAL SPINE FINDINGS  Anatomic alignment. No acute fracture. No obvious soft tissue injury. Normal thyroid gland. No evidence of spinal hemorrhage.  IMPRESSION: No acute intracranial pathology.  No evidence of acute bony injury in the cervical spine.   Electronically Signed   By: Maryclare Bean M.D.   On: 08/11/2013 13:29   Ct Chest W Contrast  08/11/2013   CLINICAL DATA:  Fall  Fall  EXAM: CT CHEST, ABDOMEN, AND PELVIS WITH CONTRAST  TECHNIQUE: Multidetector CT imaging of the chest, abdomen and pelvis was performed following the standard protocol during bolus administration of intravenous contrast.  CONTRAST:  167mL OMNIPAQUE IOHEXOL 300 MG/ML  SOLN  COMPARISON:  DG CHEST 1V PORT dated 08/11/2013; CT CHEST W/CM dated 03/12/2010  FINDINGS: CT CHEST FINDINGS  No evidence of mediastinal hemorrhage or abnormal adenopathy. No evidence of aortic injury.  No pneumothorax.  No pleural effusion.  Right lower lobe calcified granuloma on image 22 is stable. Hazy scarring in the adjacent right upper lobe on image 22. Linear scarring at the right lung and left lung bases. Hazy airspace opacities at the dependent left lung base are present. These were noted previously, and overall, they are improved.  No acute bony deformity.  CT ABDOMEN AND PELVIS FINDINGS  There is a large laceration in the liver extending to the capsule of the dome from measuring 6.0 x 12.2 x 9.8 cm. There is associated intraperitoneal hemorrhage surrounding the liver  as well as the spleen. There is no delayed contrast pooling to suggest acute extravasation.  Small lesion in the medial segment of the left lobe of the liver on image 76 of series 3 is heterogeneous with hypodensity and enhancement. This may simply represent a small hemangioma.  Spleen, gallbladder, pancreas, adrenal glands are within normal limits.  Kidneys are lobulated. Prominent cystic structure in the central left kidney represents an extrarenal pelvis. No evidence of renal injury.  Small hiatal hernia.  There is moderate intraperitoneal hemorrhage within the lower abdomen and pelvis.  Bladder and uterus are within normal limits. Adnexa are within normal limits otherwise.  No acute bony deformity.  IMPRESSION: No evidence of acute intrathoracic injury.  Large liver laceration associated with a  large amount of intraperitoneal hemorrhage.  Critical Value/emergent results were called by telephone at the time of interpretation on 08/11/2013 at 1:39 PM to Dr. Francine Graven , who verbally acknowledged these results.   Electronically Signed   By: Maryclare Bean M.D.   On: 08/11/2013 13:40   Ct Cervical Spine Wo Contrast  08/11/2013   CLINICAL DATA:  Fall  EXAM: CT HEAD WITHOUT CONTRAST  CT CERVICAL SPINE WITHOUT CONTRAST  TECHNIQUE: Multidetector CT imaging of the head and cervical spine was performed following the standard protocol without intravenous contrast. Multiplanar CT image reconstructions of the cervical spine were also generated.  COMPARISON:  CT HEAD W/O CM dated 03/12/2010; CT ABD/PELVIS W CM dated 08/11/2013  FINDINGS: CT HEAD FINDINGS  Chronic ischemic changes. Mild global atrophy. No mass effect, midline shift, or acute intracranial hemorrhage. Mastoid air cells and visualized paranasal sinuses are clear. Intact cranium  CT CERVICAL SPINE FINDINGS  Anatomic alignment. No acute fracture. No obvious soft tissue injury. Normal thyroid gland. No evidence of spinal hemorrhage.  IMPRESSION: No acute  intracranial pathology.  No evidence of acute bony injury in the cervical spine.   Electronically Signed   By: Maryclare Bean M.D.   On: 08/11/2013 13:29   Ct Abdomen Pelvis W Contrast  08/11/2013   CLINICAL DATA:  Fall  Fall  EXAM: CT CHEST, ABDOMEN, AND PELVIS WITH CONTRAST  TECHNIQUE: Multidetector CT imaging of the chest, abdomen and pelvis was performed following the standard protocol during bolus administration of intravenous contrast.  CONTRAST:  127mL OMNIPAQUE IOHEXOL 300 MG/ML  SOLN  COMPARISON:  DG CHEST 1V PORT dated 08/11/2013; CT CHEST W/CM dated 03/12/2010  FINDINGS: CT CHEST FINDINGS  No evidence of mediastinal hemorrhage or abnormal adenopathy. No evidence of aortic injury.  No pneumothorax.  No pleural effusion.  Right lower lobe calcified granuloma on image 22 is stable. Hazy scarring in the adjacent right upper lobe on image 22. Linear scarring at the right lung and left lung bases. Hazy airspace opacities at the dependent left lung base are present. These were noted previously, and overall, they are improved.  No acute bony deformity.  CT ABDOMEN AND PELVIS FINDINGS  There is a large laceration in the liver extending to the capsule of the dome from measuring 6.0 x 12.2 x 9.8 cm. There is associated intraperitoneal hemorrhage surrounding the liver as well as the spleen. There is no delayed contrast pooling to suggest acute extravasation.  Small lesion in the medial segment of the left lobe of the liver on image 76 of series 3 is heterogeneous with hypodensity and enhancement. This may simply represent a small hemangioma.  Spleen, gallbladder, pancreas, adrenal glands are within normal limits.  Kidneys are lobulated. Prominent cystic structure in the central left kidney represents an extrarenal pelvis. No evidence of renal injury.  Small hiatal hernia.  There is moderate intraperitoneal hemorrhage within the lower abdomen and pelvis.  Bladder and uterus are within normal limits. Adnexa are within  normal limits otherwise.  No acute bony deformity.  IMPRESSION: No evidence of acute intrathoracic injury.  Large liver laceration associated with a large amount of intraperitoneal hemorrhage.  Critical Value/emergent results were called by telephone at the time of interpretation on 08/11/2013 at 1:39 PM to Dr. Francine Graven , who verbally acknowledged these results.   Electronically Signed   By: Maryclare Bean M.D.   On: 08/11/2013 13:40   Dg Chest Port 1 View  08/11/2013   CLINICAL DATA:  Fall  EXAM: PORTABLE CHEST - 1 VIEW  COMPARISON:  01/09/2013  FINDINGS: Normal heart size.  Lungs hyper aerated and clear.  No pneumothorax.  IMPRESSION: No active disease.   Electronically Signed   By: Maryclare Bean M.D.   On: 08/11/2013 13:21    Review of Systems  Constitutional: Positive for malaise/fatigue and diaphoresis. Negative for fever and chills.  Eyes: Negative for blurred vision.  Respiratory: Negative for shortness of breath.   Cardiovascular: Negative for chest pain and palpitations.  Gastrointestinal: Positive for nausea and abdominal pain. Negative for vomiting.  Neurological: Negative for headaches.    Blood pressure 185/117, pulse 118, temperature 98.3 F (36.8 C), temperature source Rectal, resp. rate 19, height $RemoveBe'5\' 9"'gIKAepbyx$  (1.753 m), weight 125 lb (56.7 kg), SpO2 100.00%. Physical Exam  Constitutional: She is oriented to person, place, and time. She appears well-developed and well-nourished.  HENT:  Head: Normocephalic and atraumatic.  Right Ear: Right ear exhibits lacerations (small lac no sutures needed ).  Neck: Normal range of motion. Neck supple. No spinous process tenderness and no muscular tenderness present.  Cardiovascular: Regular rhythm and intact distal pulses.  Tachycardia present.   Respiratory: Effort normal and breath sounds normal. She has no wheezes. She has no rales. She exhibits tenderness (lower right rib cage with some ecchymosis and mild tenderness).  GI: Soft. Bowel sounds  are normal. She exhibits no distension. There is tenderness (imild) in the right upper quadrant and epigastric area.  Musculoskeletal: Normal range of motion. She exhibits no edema and no tenderness.  Lymphadenopathy:    She has no cervical adenopathy.  Neurological: She is alert and oriented to person, place, and time.  Skin: Skin is warm and dry. She is not diaphoretic.     Assessment/Plan Liver laceration  She has a significant liver laceration but does not appear to have active hemorrhage. This has been present for about 18 hours now with no real treatment. Her lactate is high, tachycardic, hct is ok, she is making some urine and mentating well.  I think reasonable due to her delayed presentation to monitor closely in the icu with serial hct, bedrest.  INR needs to be checked now.  Follow lactate. Will ask medicine to see for siadh and na mgt.  I discussed with she and her husband possiblity of needed angio vs surgery but hopefully will be ok  Alizee Maple 08/11/2013, 2:43 PM

## 2013-08-11 NOTE — ED Notes (Signed)
Trauma surgery at bedside.

## 2013-08-11 NOTE — ED Notes (Signed)
Intensivist at bedside.

## 2013-08-11 NOTE — ED Notes (Addendum)
Patient fell last night on a old tv, hit right abdomen/flank area. Minor bruising present in same area.  Went to [facility] today, found to be tachycardic, labs drawn, iSTAT chem-8 performed.  Patient has hx of hyponatremia, recent sodium was 129, glucose was 371.

## 2013-08-11 NOTE — ED Notes (Signed)
[facility] notified of pt transport. 

## 2013-08-11 NOTE — ED Provider Notes (Signed)
CSN: [id number]     Arrival date & time 08/11/13  1158 History   First MD Initiated Contact with Patient 08/11/13 1205     Chief Complaint  Patient presents with  . Fall  . Flank Pain  . Bleeding/Bruising  . Nausea  . Emesis      HPI Pt was seen at 1220. Per pt and her family, c/o sudden onset and resolution of one episode of slip and fall last night approximately 1930. Pt states she fell on her right side onto a "large wooden speaker." Pt states she began to have worsening right flank pain this morning, as well as several episodes of N/V. 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 Denies hitting head, no LOC, no AMS, no neck or back pain, no CP/SOB, no diarrhea, no black or blood in stools or emesis.    Past Medical History  Diagnosis Date  . Hyperlipemia   . Diverticulitis   . Diverticulosis   . HTN (hypertension)   . MVP (mitral valve prolapse)   . SIADH (syndrome of inappropriate ADH production)   . Deviated nasal septum   . Hyperplastic colon polyp   . Anxiety   . Hyponatremia   . Raynaud phenomenon   . Hyponatremia    Past Surgical History  Procedure Laterality Date  . Cryotherapy      for cervical dysplasia  . Transthoracic echocardiogram  11/2007    EF=>55%; mild MR; trace TR  . Nm myocar perf wall motion  11/2007    bruce myoview; perfusion defect in anterior myocardium (breast attenuation); post-stress EF 77%; normal, low risk study    Family History  Problem Relation Age of Onset  . Hyperlipidemia Father   . Hypertension Father   . Diabetes Father   . Diabetes Paternal Grandmother   . Cancer Paternal Grandfather     brain tumor  . Hyperlipidemia Other   . Hypertension Other   . Colon cancer Neg Hx   . Coronary artery disease Maternal Grandmother   . Hyperlipidemia Brother    History  Substance Use Topics  . Smoking status: Former Smoker     Types: Cigarettes    Quit date: 06/05/1998  . Smokeless tobacco: Never Used  . Alcohol Use: 8.4 oz/week    14 Cans of beer per week     Comment: daily    Review of Systems ROS: Statement: All systems negative except as marked or noted in the HPI; Constitutional: Negative for fever and chills. ; ; Eyes: Negative for eye pain, redness and discharge. ; ; ENMT: Negative for ear pain, hoarseness, nasal congestion, sinus pressure and sore throat. ; ; Cardiovascular: Negative for chest pain, palpitations, diaphoresis, dyspnea and peripheral edema. ; ; Respiratory: Negative for cough, wheezing and stridor. ; ; Gastrointestinal: +N/V. Negative for diarrhea, abdominal pain, blood in stool, hematemesis, jaundice and rectal bleeding. . ; ; Genitourinary: +flank pain. Negative for dysuria and hematuria. ; ; Musculoskeletal: Negative for back pain and neck pain. Negative for swelling and deformity..; ; Skin: +bruising. Negative for pruritus, rash, abrasions, blisters, and skin lesion.; ; Neuro: Negative for headache, lightheadedness and neck stiffness. Negative for weakness, altered level of consciousness , altered mental status, extremity weakness, paresthesias, involuntary movement, seizure and syncope.      Allergies  Azelastine hcl; Miralax; Red dye; and Zyban  Home Medications   Current  Outpatient Rx  Name  Route  Sig  Dispense  Refill  . amLODipine (NORVASC) 2.5 MG tablet   Oral   Take 2.5 mg by mouth daily as needed (based on BP).          . Biotin 1 MG CAPS   Oral   Take 1 tablet by mouth daily.         . Calcium-Vitamin D (CALTRATE 600 PLUS-VIT D PO)   Oral   Take 1 tablet by mouth daily.         Marland Kitchen ibandronate (BONIVA) 150 MG tablet   Oral   Take 1 tablet (150 mg total) by mouth every 30 (thirty) days. Take in the morning with a full glass of water, on an empty stomach, and do not take anything else by mouth or lie down for the next 30 min.   3 tablet   3   . latanoprost  (XALATAN) 0.005 % ophthalmic solution   Both Eyes   Place 1 drop into both eyes at bedtime.         . vitamin C (ASCORBIC ACID) 500 MG tablet   Oral   Take 500 mg by mouth daily.         . metoprolol succinate (TOPROL-XL) 25 MG 24 hr tablet   Oral   Take 25 mg by mouth daily as needed (based on BP).           BP 173/147  Pulse 133  Temp(Src) 98.3 F (36.8 C) (Rectal)  Resp 27  Ht 5\' 9"  (1.753 m)  Wt 125 lb (56.7 kg)  BMI 18.45 kg/m2  SpO2 100% Physical Exam 1225: Physical examination: Vital signs and O2 SAT: Reviewed; Constitutional: Well developed, Well nourished, Uncomfortable appearing.; Head and Face: Normocephalic, Atraumatic; Eyes: EOMI, PERRL, No scleral icterus; ENMT: Mouth and pharynx normal, Left TM normal, Right TM normal, Mucous membranes moist; Neck: Supple, Trachea midline; Spine: No midline CS, TS, LS tenderness.; Cardiovascular: Tachycardic rate and rhythm, No gallop; Respiratory: Breath sounds clear & equal bilaterally, No rales, rhonchi, wheezes, Normal respiratory effort/excursion; Chest: Nontender, No deformity, Movement normal, No soft tissue crepitus, No abrasions. +right lower lateral chest wall ecchymosis.; Abdomen: Soft, +diffuse tenderness to palp, esp RUQ. Nondistended, Normal bowel sounds, No abrasions. +right lateral torso ecchymosis.; Genitourinary: No CVA tenderness;; Extremities: No deformity, Full range of motion major/large joints of bilat UE's and LE's without pain or tenderness to palp, Neurovascularly intact, Pulses normal, No tenderness, No edema, Pelvis stable; Neuro: AA&Ox3, GCS 15.  Major CN grossly intact. Speech clear. No gross focal motor or sensory deficits in extremities.; Skin: Color normal, Warm, Dry    ED Course  Procedures   1315:  Pt tachycardic on monitor, HR 130-140's. Pt's EKG with prolonged QTc; will not dose any further QT prolonging agents (already received IV zofran PTA). CT A/P reviewed by myself: concern for liver  laceration and free fluid in abd. No pneumothorax on CT chest. 2nd large bore IV placed, IVF NS bolus in progress. Type and Screen ordered. T/C to [facility]. Burt Knack:  Will have dept read pt's CT's next.   1325:  T/C to Trauma Surgery Dr. Donne Hazel, case discussed, including:  HPI, pertinent PM/SHx, VS/PE, dx testing, ED course and treatment:  requests to call back after CT scans read by [facility].   1335:  Troponin elevated but no acute ST elevation on EKG and pt denies CP; likely due to demand. Lactic acid, LFT's and WBC count also elevated;  likely due to liver trauma. Hyponatremic per hx of SIADH. Hgb stable compared to pt's baseline, but dropped 2 points in the past hour. Type and screen in progress. T/C from [facility] Dr. Barbie Banner:  Large liver lac with large amount of blood in abd, no active extravasation, no other injuries.   1355:  Dx and testing d/w pt and family.  Questions answered.  Verb understanding, agreeable to admit.  T/C to Trauma Dr. Donne Hazel, case discussed, including:  HPI, pertinent PM/SHx, VS/PE, dx testing, ED course and treatment:  Agreeable to come to ED for eval to admit, states he will admit to ICU but will need medicine consult. T/C to PCCM Dr. Joya Gaskins, case discussed, including:  HPI, pertinent PM/SHx, VS/PE, dx testing, ED course and treatment:  Agreeable to consult.      EKG Interpretation   Date/Time:  Saturday August 11 2013 12:04:58 EDT Ventricular Rate:  139 PR Interval:  124 QRS Duration: 80 QT Interval:  394 QTC Calculation: 599 R Axis:   55 Text Interpretation:  Sinus tachycardia Probable left atrial enlargement  Probable inferior infarct, old Prolonged QT interval When compared with  ECG of 03/12/2010 Rate faster Confirmed by [facility]  MD, Nunzio Cory [phone])  on 08/11/2013 2:04:41 PM      MDM  MDM Reviewed: previous chart, nursing note and vitals Reviewed previous: labs and ECG Interpretation: labs, ECG, x-ray and CT scan Total time providing critical care: 30-74  minutes. This excludes time spent performing separately reportable procedures and services. Consults: trauma and critical care    CRITICAL CARE Performed by: Alfonzo Feller Total critical care time: 60 Critical care time was exclusive of separately billable procedures and treating other patients. Critical care was necessary to treat or prevent imminent or life-threatening deterioration. Critical care was time spent personally by me on the following activities: development of treatment plan with patient and/or surrogate as well as nursing, discussions with consultants, evaluation of patient's response to treatment, examination of patient, obtaining history from patient or surrogate, ordering and performing treatments and interventions, ordering and review of laboratory studies, ordering and review of radiographic studies, pulse oximetry and re-evaluation of patient's condition.  Results for orders placed during the hospital encounter of 08/11/13  URINALYSIS, ROUTINE W REFLEX MICROSCOPIC      Result Value Ref Range   Color, Urine YELLOW  YELLOW   APPearance CLEAR  CLEAR   Specific Gravity, Urine 1.020  1.005 - 1.030   pH 7.0  5.0 - 8.0   Glucose, UA 250 (*) NEGATIVE mg/dL   Hgb urine dipstick SMALL (*) NEGATIVE   Bilirubin Urine NEGATIVE  NEGATIVE   Ketones, ur NEGATIVE  NEGATIVE mg/dL   Protein, ur 100 (*) NEGATIVE mg/dL   Urobilinogen, UA 0.2  0.0 - 1.0 mg/dL   Nitrite NEGATIVE  NEGATIVE   Leukocytes, UA NEGATIVE  NEGATIVE  CBC WITH DIFFERENTIAL      Result Value Ref Range   WBC 24.6 (*) 4.0 - 10.5 K/uL   RBC 4.39  3.87 - 5.11 MIL/uL   Hemoglobin 15.1 (*) 12.0 - 15.0 g/dL   HCT 43.6  36.0 - 46.0 %   MCV 99.3  78.0 - 100.0 fL   MCH 34.4 (*) 26.0 - 34.0 pg   MCHC 34.6  30.0 - 36.0 g/dL   RDW 13.2  11.5 - 15.5 %   Platelets 259  150 - 400 K/uL   Neutrophils Relative % 89 (*) 43 - 77 %   Neutro Abs 21.8 (*) 1.7 -  7.7 K/uL   Lymphocytes Relative 5 (*) 12 - 46 %   Lymphs Abs  1.1  0.7 - 4.0 K/uL   Monocytes Relative 7  3 - 12 %   Monocytes Absolute 1.6 (*) 0.1 - 1.0 K/uL   Eosinophils Relative 0  0 - 5 %   Eosinophils Absolute 0.0  0.0 - 0.7 K/uL   Basophils Relative 0  0 - 1 %   Basophils Absolute 0.0  0.0 - 0.1 K/uL  COMPREHENSIVE METABOLIC PANEL      Result Value Ref Range   Sodium 129 (*) 137 - 147 mEq/L   Potassium 4.3  3.7 - 5.3 mEq/L   Chloride 85 (*) 96 - 112 mEq/L   CO2 20  19 - 32 mEq/L   Glucose, Bld 376 (*) 70 - 99 mg/dL   BUN 14  6 - 23 mg/dL   Creatinine, Ser 0.91  0.50 - 1.10 mg/dL   Calcium 10.0  8.4 - 10.5 mg/dL   Total Protein 7.9  6.0 - 8.3 g/dL   Albumin 3.8  3.5 - 5.2 g/dL   AST 640 (*) 0 - 37 U/L   ALT 517 (*) 0 - 35 U/L   Alkaline Phosphatase 66  39 - 117 U/L   Total Bilirubin 0.8  0.3 - 1.2 mg/dL   GFR calc non Af Amer 69 (*) >90 mL/min   GFR calc Af Amer 80 (*) >90 mL/min  LIPASE, BLOOD      Result Value Ref Range   Lipase 30  11 - 59 U/L  LACTIC ACID, PLASMA      Result Value Ref Range   Lactic Acid, Venous 6.6 (*) 0.5 - 2.2 mmol/L  TROPONIN I      Result Value Ref Range   Troponin I 0.73 (*) <0.30 ng/mL  MAGNESIUM      Result Value Ref Range   Magnesium 1.8  1.5 - 2.5 mg/dL  URINE MICROSCOPIC-ADD ON      Result Value Ref Range   Squamous Epithelial / LPF FEW (*) RARE   RBC / HPF 0-2  <3 RBC/hpf   Casts HYALINE CASTS (*) NEGATIVE  PROTIME-INR      Result Value Ref Range   Prothrombin Time 15.0  11.6 - 15.2 seconds   INR 1.21  0.00 - 1.49  CBC      Result Value Ref Range   WBC 25.4 (*) 4.0 - 10.5 K/uL   RBC 3.62 (*) 3.87 - 5.11 MIL/uL   Hemoglobin 12.3  12.0 - 15.0 g/dL   HCT 35.3 (*) 36.0 - 46.0 %   MCV 97.5  78.0 - 100.0 fL   MCH 34.0  26.0 - 34.0 pg   MCHC 34.8  30.0 - 36.0 g/dL   RDW 13.2  11.5 - 15.5 %   Platelets 179  150 - 400 K/uL  TYPE AND SCREEN      Result Value Ref Range   ABO/RH(D) A POS     Antibody Screen NEG     Sample Expiration 08/14/2013     Ct Head Wo Contrast 08/11/2013   CLINICAL  DATA:  Fall  EXAM: CT HEAD WITHOUT CONTRAST  CT CERVICAL SPINE WITHOUT CONTRAST  TECHNIQUE: Multidetector CT imaging of the head and cervical spine was performed following the standard protocol without intravenous contrast. Multiplanar CT image reconstructions of the cervical spine were also generated.  COMPARISON:  CT HEAD W/O CM dated 03/12/2010; CT ABD/PELVIS W CM dated 08/11/2013  FINDINGS:  CT HEAD FINDINGS  Chronic ischemic changes. Mild global atrophy. No mass effect, midline shift, or acute intracranial hemorrhage. Mastoid air cells and visualized paranasal sinuses are clear. Intact cranium  CT CERVICAL SPINE FINDINGS  Anatomic alignment. No acute fracture. No obvious soft tissue injury. Normal thyroid gland. No evidence of spinal hemorrhage.  IMPRESSION: No acute intracranial pathology.  No evidence of acute bony injury in the cervical spine.   Electronically Signed   By: Maryclare Bean M.D.   On: 08/11/2013 13:29   Ct Chest W Contrast 08/11/2013   CLINICAL DATA:  Fall  Fall  EXAM: CT CHEST, ABDOMEN, AND PELVIS WITH CONTRAST  TECHNIQUE: Multidetector CT imaging of the chest, abdomen and pelvis was performed following the standard protocol during bolus administration of intravenous contrast.  CONTRAST:  158mL OMNIPAQUE IOHEXOL 300 MG/ML  SOLN  COMPARISON:  DG CHEST 1V PORT dated 08/11/2013; CT CHEST W/CM dated 03/12/2010  FINDINGS: CT CHEST FINDINGS  No evidence of mediastinal hemorrhage or abnormal adenopathy. No evidence of aortic injury.  No pneumothorax.  No pleural effusion.  Right lower lobe calcified granuloma on image 22 is stable. Hazy scarring in the adjacent right upper lobe on image 22. Linear scarring at the right lung and left lung bases. Hazy airspace opacities at the dependent left lung base are present. These were noted previously, and overall, they are improved.  No acute bony deformity.  CT ABDOMEN AND PELVIS FINDINGS  There is a large laceration in the liver extending to the capsule of the dome  from measuring 6.0 x 12.2 x 9.8 cm. There is associated intraperitoneal hemorrhage surrounding the liver as well as the spleen. There is no delayed contrast pooling to suggest acute extravasation.  Small lesion in the medial segment of the left lobe of the liver on image 76 of series 3 is heterogeneous with hypodensity and enhancement. This may simply represent a small hemangioma.  Spleen, gallbladder, pancreas, adrenal glands are within normal limits.  Kidneys are lobulated. Prominent cystic structure in the central left kidney represents an extrarenal pelvis. No evidence of renal injury.  Small hiatal hernia.  There is moderate intraperitoneal hemorrhage within the lower abdomen and pelvis.  Bladder and uterus are within normal limits. Adnexa are within normal limits otherwise.  No acute bony deformity.  IMPRESSION: No evidence of acute intrathoracic injury.  Large liver laceration associated with a large amount of intraperitoneal hemorrhage.  Critical Value/emergent results were called by telephone at the time of interpretation on 08/11/2013 at 1:39 PM to Dr. Francine Graven , who verbally acknowledged these results.   Electronically Signed   By: Maryclare Bean M.D.   On: 08/11/2013 13:40   Ct Cervical Spine Wo Contrast 08/11/2013   CLINICAL DATA:  Fall  EXAM: CT HEAD WITHOUT CONTRAST  CT CERVICAL SPINE WITHOUT CONTRAST  TECHNIQUE: Multidetector CT imaging of the head and cervical spine was performed following the standard protocol without intravenous contrast. Multiplanar CT image reconstructions of the cervical spine were also generated.  COMPARISON:  CT HEAD W/O CM dated 03/12/2010; CT ABD/PELVIS W CM dated 08/11/2013  FINDINGS: CT HEAD FINDINGS  Chronic ischemic changes. Mild global atrophy. No mass effect, midline shift, or acute intracranial hemorrhage. Mastoid air cells and visualized paranasal sinuses are clear. Intact cranium  CT CERVICAL SPINE FINDINGS  Anatomic alignment. No acute fracture. No obvious soft  tissue injury. Normal thyroid gland. No evidence of spinal hemorrhage.  IMPRESSION: No acute intracranial pathology.  No evidence of acute bony injury  in the cervical spine.   Electronically Signed   By: Maryclare Bean M.D.   On: 08/11/2013 13:29   Ct Abdomen Pelvis W Contrast 08/11/2013   CLINICAL DATA:  Fall  Fall  EXAM: CT CHEST, ABDOMEN, AND PELVIS WITH CONTRAST  TECHNIQUE: Multidetector CT imaging of the chest, abdomen and pelvis was performed following the standard protocol during bolus administration of intravenous contrast.  CONTRAST:  129mL OMNIPAQUE IOHEXOL 300 MG/ML  SOLN  COMPARISON:  DG CHEST 1V PORT dated 08/11/2013; CT CHEST W/CM dated 03/12/2010  FINDINGS: CT CHEST FINDINGS  No evidence of mediastinal hemorrhage or abnormal adenopathy. No evidence of aortic injury.  No pneumothorax.  No pleural effusion.  Right lower lobe calcified granuloma on image 22 is stable. Hazy scarring in the adjacent right upper lobe on image 22. Linear scarring at the right lung and left lung bases. Hazy airspace opacities at the dependent left lung base are present. These were noted previously, and overall, they are improved.  No acute bony deformity.  CT ABDOMEN AND PELVIS FINDINGS  There is a large laceration in the liver extending to the capsule of the dome from measuring 6.0 x 12.2 x 9.8 cm. There is associated intraperitoneal hemorrhage surrounding the liver as well as the spleen. There is no delayed contrast pooling to suggest acute extravasation.  Small lesion in the medial segment of the left lobe of the liver on image 76 of series 3 is heterogeneous with hypodensity and enhancement. This may simply represent a small hemangioma.  Spleen, gallbladder, pancreas, adrenal glands are within normal limits.  Kidneys are lobulated. Prominent cystic structure in the central left kidney represents an extrarenal pelvis. No evidence of renal injury.  Small hiatal hernia.  There is moderate intraperitoneal hemorrhage within the  lower abdomen and pelvis.  Bladder and uterus are within normal limits. Adnexa are within normal limits otherwise.  No acute bony deformity.  IMPRESSION: No evidence of acute intrathoracic injury.  Large liver laceration associated with a large amount of intraperitoneal hemorrhage.  Critical Value/emergent results were called by telephone at the time of interpretation on 08/11/2013 at 1:39 PM to Dr. Francine Graven , who verbally acknowledged these results.   Electronically Signed   By: Maryclare Bean M.D.   On: 08/11/2013 13:40   Dg Chest Port 1 View 08/11/2013   CLINICAL DATA:  Fall  EXAM: PORTABLE CHEST - 1 VIEW  COMPARISON:  01/09/2013  FINDINGS: Normal heart size.  Lungs hyper aerated and clear.  No pneumothorax.  IMPRESSION: No active disease.   Electronically Signed   By: Maryclare Bean M.D.   On: 08/11/2013 13:21    Results for Candice Gray, Candice Gray (MRN [id number]) as of 08/11/2013 15:54  Ref. Range 12/26/2012 10:18 01/02/2013 08:39 08/11/2013 11:26 08/11/2013 12:29  Sodium Latest Range: 137-147 mmol/L 133 (L) 136 129 (L) 129 (L)  Chloride Latest Range: 96-112 mEq/L 95 (L) 101 91 (L) 85 (L)   Results for Candice Gray, Candice Gray (MRN [id number]) as of 08/11/2013 15:54  Ref. Range 12/26/2012 10:18 01/02/2013 08:39 08/11/2013 11:26 08/11/2013 12:29  Hemoglobin Latest Range: 12.0-15.0 g/dL 14.4 13.3 17.0 (H) 15.1 (H)  HCT Latest Range: 36.0-46.0 % 42.9 39.9 50.0 (H) 43.6      Alfonzo Feller, DO 08/13/13 2040

## 2013-08-12 DIAGNOSIS — D62 Acute posthemorrhagic anemia: Secondary | ICD-10-CM

## 2013-08-12 DIAGNOSIS — I10 Essential (primary) hypertension: Secondary | ICD-10-CM

## 2013-08-12 LAB — CBC
HCT: 30.2 % — ABNORMAL LOW (ref 36.0–46.0)
HEMATOCRIT: 30.7 % — AB (ref 36.0–46.0)
Hemoglobin: 10.5 g/dL — ABNORMAL LOW (ref 12.0–15.0)
Hemoglobin: 10.7 g/dL — ABNORMAL LOW (ref 12.0–15.0)
MCH: 33.9 pg (ref 26.0–34.0)
MCH: 33.9 pg (ref 26.0–34.0)
MCHC: 34.8 g/dL (ref 30.0–36.0)
MCHC: 34.9 g/dL (ref 30.0–36.0)
MCV: 97.4 fL (ref 78.0–100.0)
MCV: 97.2 fL (ref 78.0–100.0)
PLATELETS: 164 10*3/uL (ref 150–400)
Platelets: 171 10*3/uL (ref 150–400)
RBC: 3.1 MIL/uL — ABNORMAL LOW (ref 3.87–5.11)
RBC: 3.16 MIL/uL — AB (ref 3.87–5.11)
RDW: 13.4 % (ref 11.5–15.5)
RDW: 13.3 % (ref 11.5–15.5)
WBC: 19.1 10*3/uL — ABNORMAL HIGH (ref 4.0–10.5)
WBC: 22.6 10*3/uL — AB (ref 4.0–10.5)

## 2013-08-12 LAB — PROTIME-INR
INR: 1.11 (ref 0.00–1.49)
PROTHROMBIN TIME: 14.1 s (ref 11.6–15.2)

## 2013-08-12 LAB — BASIC METABOLIC PANEL
BUN: 15 mg/dL (ref 6–23)
CHLORIDE: 99 meq/L (ref 96–112)
CO2: 22 meq/L (ref 19–32)
Calcium: 8.4 mg/dL (ref 8.4–10.5)
Creatinine, Ser: 0.74 mg/dL (ref 0.50–1.10)
GFR calc Af Amer: >90 mL/min (ref >90)
GFR calc non Af Amer: >90 mL/min (ref >90)
Glucose, Bld: 134 mg/dL — ABNORMAL HIGH (ref 70–99)
POTASSIUM: 3.8 meq/L (ref 3.7–5.3)
SODIUM: 135 meq/L — AB (ref 137–147)

## 2013-08-12 LAB — HEMOGLOBIN AND HEMATOCRIT, BLOOD
HCT: 27.6 % — ABNORMAL LOW (ref 36.0–46.0)
HCT: 24.1 % — ABNORMAL LOW (ref 36.0–46.0)
Hemoglobin: 9.7 g/dL — ABNORMAL LOW (ref 12.0–15.0)
Hemoglobin: 8.4 g/dL — ABNORMAL LOW (ref 12.0–15.0)

## 2013-08-12 LAB — GLUCOSE, CAPILLARY
Glucose-Capillary: 145 mg/dL — ABNORMAL HIGH (ref 70–99)
Glucose-Capillary: 124 mg/dL — ABNORMAL HIGH (ref 70–99)
Glucose-Capillary: 142 mg/dL — ABNORMAL HIGH (ref 70–99)

## 2013-08-12 LAB — URINE CULTURE: Colony Count: 65000

## 2013-08-12 LAB — LACTIC ACID, PLASMA: Lactic Acid, Venous: 1 mmol/L (ref 0.5–2.2)

## 2013-08-12 MED ORDER — LABETALOL HCL 5 MG/ML IV SOLN
10.0000 mg | INTRAVENOUS | Status: DC | PRN
  Filled 2013-08-12 (×2): qty 4

## 2013-08-12 MED ORDER — OXYCODONE-ACETAMINOPHEN 5-325 MG PO TABS
1.0000 | ORAL_TABLET | ORAL | Status: DC | PRN
  Administered 2013-08-12: 1 via ORAL
  Administered 2013-08-13 – 2013-08-14 (×5): 2 via ORAL
  Filled 2013-08-12: qty 1
  Filled 2013-08-12 (×3): qty 2
  Filled 2013-08-12: qty 1
  Filled 2013-08-12 (×2): qty 2

## 2013-08-12 MED ORDER — LATANOPROST 0.005 % OP SOLN
1.0000 [drp] | Freq: Every day | OPHTHALMIC | Status: DC
  Administered 2013-08-12 – 2013-08-15 (×4): 1 [drp] via OPHTHALMIC
  Filled 2013-08-12 (×2): qty 2.5

## 2013-08-12 NOTE — Progress Notes (Signed)
Patient ID: Candice Gray, female   DOB: 08-29-1955, 58 y.o.   MRN: [id number]  LOS: 1 day   Subjective: C/o abd pain, better with pain meds.  No n/v.  UOP adequate.  BP high.  Pt on bedrest.   Objective: Vital signs in last 24 hours: Temp:  [97.5 F (36.4 C)-98.8 F (37.1 C)] 97.9 F (36.6 C) (03/22 0745) Pulse Rate:  [81-152] 104 (03/22 0718) Resp:  [16-31] 30 (03/22 0718) BP: (101-194)/(65-147) 180/110 mmHg (03/22 0718) SpO2:  [95 %-100 %] 100 % (03/22 0700) Weight:  [125 lb (56.7 kg)] 125 lb (56.7 kg) (03/21 1611) Last BM Date: 08/11/13  Lab Results:  CBC  Recent Labs  08/12/13 0007 08/12/13 0307  WBC 19.1* 22.6*  HGB 10.5* 10.7*  HCT 30.2* 30.7*  PLT 164 171   BMET  Recent Labs  08/11/13 1229 08/12/13 0307  NA 129* 135*  K 4.3 3.8  CL 85* 99  CO2 20 22  GLUCOSE 376* 134*  BUN 14 15  CREATININE 0.91 0.74  CALCIUM 10.0 8.4    Imaging: Ct Head Wo Contrast  08/11/2013   CLINICAL DATA:  Fall  EXAM: CT HEAD WITHOUT CONTRAST  CT CERVICAL SPINE WITHOUT CONTRAST  TECHNIQUE: Multidetector CT imaging of the head and cervical spine was performed following the standard protocol without intravenous contrast. Multiplanar CT image reconstructions of the cervical spine were also generated.  COMPARISON:  CT HEAD W/O CM dated 03/12/2010; CT ABD/PELVIS W CM dated 08/11/2013  FINDINGS: CT HEAD FINDINGS  Chronic ischemic changes. Mild global atrophy. No mass effect, midline shift, or acute intracranial hemorrhage. Mastoid air cells and visualized paranasal sinuses are clear. Intact cranium  CT CERVICAL SPINE FINDINGS  Anatomic alignment. No acute fracture. No obvious soft tissue injury. Normal thyroid gland. No evidence of spinal hemorrhage.  IMPRESSION: No acute intracranial pathology.  No evidence of acute bony injury in the cervical spine.   Electronically Signed   By: Maryclare Bean M.D.   On: 08/11/2013 13:29   Ct Chest W Contrast  08/11/2013   CLINICAL DATA:  Fall  Fall  EXAM: CT  CHEST, ABDOMEN, AND PELVIS WITH CONTRAST  TECHNIQUE: Multidetector CT imaging of the chest, abdomen and pelvis was performed following the standard protocol during bolus administration of intravenous contrast.  CONTRAST:  158mL OMNIPAQUE IOHEXOL 300 MG/ML  SOLN  COMPARISON:  DG CHEST 1V PORT dated 08/11/2013; CT CHEST W/CM dated 03/12/2010  FINDINGS: CT CHEST FINDINGS  No evidence of mediastinal hemorrhage or abnormal adenopathy. No evidence of aortic injury.  No pneumothorax.  No pleural effusion.  Right lower lobe calcified granuloma on image 22 is stable. Hazy scarring in the adjacent right upper lobe on image 22. Linear scarring at the right lung and left lung bases. Hazy airspace opacities at the dependent left lung base are present. These were noted previously, and overall, they are improved.  No acute bony deformity.  CT ABDOMEN AND PELVIS FINDINGS  There is a large laceration in the liver extending to the capsule of the dome from measuring 6.0 x 12.2 x 9.8 cm. There is associated intraperitoneal hemorrhage surrounding the liver as well as the spleen. There is no delayed contrast pooling to suggest acute extravasation.  Small lesion in the medial segment of the left lobe of the liver on image 76 of series 3 is heterogeneous with hypodensity and enhancement. This may simply represent a small hemangioma.  Spleen, gallbladder, pancreas, adrenal glands are within normal limits.  Kidneys  are lobulated. Prominent cystic structure in the central left kidney represents an extrarenal pelvis. No evidence of renal injury.  Small hiatal hernia.  There is moderate intraperitoneal hemorrhage within the lower abdomen and pelvis.  Bladder and uterus are within normal limits. Adnexa are within normal limits otherwise.  No acute bony deformity.  IMPRESSION: No evidence of acute intrathoracic injury.  Large liver laceration associated with a large amount of intraperitoneal hemorrhage.  Critical Value/emergent results were called  by telephone at the time of interpretation on 08/11/2013 at 1:39 PM to Dr. Francine Graven , who verbally acknowledged these results.   Electronically Signed   By: Maryclare Bean M.D.   On: 08/11/2013 13:40   Ct Cervical Spine Wo Contrast  08/11/2013   CLINICAL DATA:  Fall  EXAM: CT HEAD WITHOUT CONTRAST  CT CERVICAL SPINE WITHOUT CONTRAST  TECHNIQUE: Multidetector CT imaging of the head and cervical spine was performed following the standard protocol without intravenous contrast. Multiplanar CT image reconstructions of the cervical spine were also generated.  COMPARISON:  CT HEAD W/O CM dated 03/12/2010; CT ABD/PELVIS W CM dated 08/11/2013  FINDINGS: CT HEAD FINDINGS  Chronic ischemic changes. Mild global atrophy. No mass effect, midline shift, or acute intracranial hemorrhage. Mastoid air cells and visualized paranasal sinuses are clear. Intact cranium  CT CERVICAL SPINE FINDINGS  Anatomic alignment. No acute fracture. No obvious soft tissue injury. Normal thyroid gland. No evidence of spinal hemorrhage.  IMPRESSION: No acute intracranial pathology.  No evidence of acute bony injury in the cervical spine.   Electronically Signed   By: Maryclare Bean M.D.   On: 08/11/2013 13:29   Ct Abdomen Pelvis W Contrast  08/11/2013   CLINICAL DATA:  Fall  Fall  EXAM: CT CHEST, ABDOMEN, AND PELVIS WITH CONTRAST  TECHNIQUE: Multidetector CT imaging of the chest, abdomen and pelvis was performed following the standard protocol during bolus administration of intravenous contrast.  CONTRAST:  142mL OMNIPAQUE IOHEXOL 300 MG/ML  SOLN  COMPARISON:  DG CHEST 1V PORT dated 08/11/2013; CT CHEST W/CM dated 03/12/2010  FINDINGS: CT CHEST FINDINGS  No evidence of mediastinal hemorrhage or abnormal adenopathy. No evidence of aortic injury.  No pneumothorax.  No pleural effusion.  Right lower lobe calcified granuloma on image 22 is stable. Hazy scarring in the adjacent right upper lobe on image 22. Linear scarring at the right lung and left lung  bases. Hazy airspace opacities at the dependent left lung base are present. These were noted previously, and overall, they are improved.  No acute bony deformity.  CT ABDOMEN AND PELVIS FINDINGS  There is a large laceration in the liver extending to the capsule of the dome from measuring 6.0 x 12.2 x 9.8 cm. There is associated intraperitoneal hemorrhage surrounding the liver as well as the spleen. There is no delayed contrast pooling to suggest acute extravasation.  Small lesion in the medial segment of the left lobe of the liver on image 76 of series 3 is heterogeneous with hypodensity and enhancement. This may simply represent a small hemangioma.  Spleen, gallbladder, pancreas, adrenal glands are within normal limits.  Kidneys are lobulated. Prominent cystic structure in the central left kidney represents an extrarenal pelvis. No evidence of renal injury.  Small hiatal hernia.  There is moderate intraperitoneal hemorrhage within the lower abdomen and pelvis.  Bladder and uterus are within normal limits. Adnexa are within normal limits otherwise.  No acute bony deformity.  IMPRESSION: No evidence of acute intrathoracic injury.  Large  liver laceration associated with a large amount of intraperitoneal hemorrhage.  Critical Value/emergent results were called by telephone at the time of interpretation on 08/11/2013 at 1:39 PM to Dr. Francine Graven , who verbally acknowledged these results.   Electronically Signed   By: Maryclare Bean M.D.   On: 08/11/2013 13:40   Dg Chest Port 1 View  08/11/2013   CLINICAL DATA:  Fall  EXAM: PORTABLE CHEST - 1 VIEW  COMPARISON:  01/09/2013  FINDINGS: Normal heart size.  Lungs hyper aerated and clear.  No pneumothorax.  IMPRESSION: No active disease.   Electronically Signed   By: Maryclare Bean M.D.   On: 08/11/2013 13:21    PE: General appearance: alert, cooperative and no distress Resp: clear to auscultation bilaterally Cardio: regular rate and rhythm, S1, S2 normal, no murmur,  click, rub or gallop GI: soft, non-tender; bowel sounds normal; no masses,  no organomegaly Extremities: extremities normal, atraumatic, no cyanosis or edema   Patient Active Problem List   Diagnosis Date Noted  . Liver laceration 08/11/2013  . Liver laceration, major 08/11/2013  . Underweight 01/09/2013  . PREDIABETES 11/04/2009  . 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 HYPERLIPIDEMIA 08/13/2008  . HYPERTENSION, BENIGN ESSENTIAL, LABILE 08/13/2008  . MITRAL VALVE PROLAPSE 08/13/2008  . ALLERGIC RHINITIS, SEASONAL 08/13/2008  . DIVERTICULOSIS, SIGMOID COLON 08/13/2008  . NUMMULAR ECZEMA 08/13/2008  . Other osteoporosis 08/13/2008   Assessment/Plan: Fall Friday night Liver laceration --h&h down a bit from admission, but stable -monitor q6h today -continue with bedrest(started 3/21 evening) -clear liquid diet SIADH -appreciate CCM assistance HTN -she has been hypertensive, ? Resume home meds -PRN labetalol  ABL anemia - stable c/w serial h&h VTE - SCD's, Lovenox contraindicated  FEN - clears, oral oral pain meds Dispo -- SDU  Erby Pian, ANP-BC Pager: [phone] General Trauma PA Pager: [phone]   08/12/2013 8:26 AM

## 2013-08-12 NOTE — Progress Notes (Signed)
Seen, agree with above.  Feeling OK overall.   Does feel bloated and gassy.  Pain OK.   Stay on bedrest today, continue q 6 H&H.  If stable, will do bathroom privileges tomorrow and advance diet.

## 2013-08-13 DIAGNOSIS — E236 Other disorders of pituitary gland: Secondary | ICD-10-CM

## 2013-08-13 DIAGNOSIS — S36113A Laceration of liver, unspecified degree, initial encounter: Principal | ICD-10-CM

## 2013-08-13 LAB — HEMOGLOBIN AND HEMATOCRIT, BLOOD
HCT: 23.3 % — ABNORMAL LOW (ref 36.0–46.0)
HEMATOCRIT: 23.6 % — AB (ref 36.0–46.0)
HEMATOCRIT: 26.1 % — AB (ref 36.0–46.0)
HEMOGLOBIN: 8.2 g/dL — AB (ref 12.0–15.0)
HEMOGLOBIN: 8.3 g/dL — AB (ref 12.0–15.0)
Hemoglobin: 9.1 g/dL — ABNORMAL LOW (ref 12.0–15.0)

## 2013-08-13 MED ORDER — IBANDRONATE SODIUM 150 MG PO TABS: 150.0000 mg | ORAL_TABLET | ORAL | Status: DC

## 2013-08-13 MED ORDER — PANTOPRAZOLE SODIUM 40 MG PO TBEC
40.0000 mg | DELAYED_RELEASE_TABLET | Freq: Every evening | ORAL | Status: DC
  Administered 2013-08-13: 40 mg via ORAL
  Filled 2013-08-13: qty 1

## 2013-08-13 MED ORDER — AMLODIPINE BESYLATE 2.5 MG PO TABS: 2.5000 mg | ORAL_TABLET | Freq: Every day | ORAL | Status: DC | PRN

## 2013-08-13 MED ORDER — METOPROLOL SUCCINATE ER 25 MG PO TB24: 25.0000 mg | ORAL_TABLET | Freq: Every day | ORAL | Status: DC | PRN

## 2013-08-13 NOTE — Progress Notes (Signed)
Received to room 6n18, a/ox4, cooperative, denies nausea/pain at this time, oriented to room and surroundings

## 2013-08-13 NOTE — Progress Notes (Signed)
Nutrition Brief Note  Patient identified on the Malnutrition Screening Tool (MST) Report. Pt reports that her usual body weight is 120 - 125 lb. She states that she received a salad for lunch and loved it. Stated her appetite was good PTA. Declined oral nutrition supplements.  Wt Readings from Last 15 Encounters:  08/11/13 125 lb (56.7 kg)  06/05/13 131 lb 3.2 oz (59.512 kg)  01/09/13 118 lb 4 oz (53.638 kg)  12/26/12 119 lb (53.978 kg)  12/20/12 118 lb 12 oz (53.865 kg)  05/30/12 121 lb 12.8 oz (55.248 kg)  01/13/12 122 lb (55.339 kg)  01/04/12 124 lb (56.246 kg)  11/24/10 134 lb 12.8 oz (61.145 kg)  07/14/10 128 lb 4 oz (58.174 kg)  07/03/10 127 lb 8 oz (57.834 kg)  03/24/10 122 lb (55.339 kg)  01/20/10 121 lb 6.4 oz (55.067 kg)  06/20/09 123 lb 2.1 oz (55.852 kg)  06/14/09 125 lb (56.7 kg)    Body mass index is 18.45 kg/(m^2). Patient meets criteria for Underweight based on current BMI.   Current diet order is Regular, patient is consuming approximately >50% of meals at this time. Labs and medications reviewed.   No nutrition interventions warranted at this time. If nutrition issues arise, please consult RD.   Inda Coke MS, RD, LDN Inpatient Registered Dietitian Pager: [phone] After-hours pager: [phone]

## 2013-08-13 NOTE — ED Provider Notes (Signed)
Medical screening examination/treatment/procedure(s) were performed by a resident physician or non-physician practitioner and as the supervising physician I was immediately available for consultation/collaboration.  Lynne Leader, MD    Gregor Hams, MD 08/13/13 [phone]

## 2013-08-13 NOTE — Progress Notes (Signed)
Trauma Service Note  Subjective: Patient feels bloated, but otherwise not having a lot of pain.  No acute distress  Objective: Vital signs in last 24 hours: Temp:  [98.1 F (36.7 C)-98.8 F (37.1 C)] 98.2 F (36.8 C) (03/23 0748) Pulse Rate:  [83-111] 83 (03/23 0751) Resp:  [13-24] 13 (03/23 0751) BP: (119-159)/(63-96) 142/92 mmHg (03/23 0751) SpO2:  [99 %-100 %] 100 % (03/23 0751) Last BM Date: 08/11/13  Intake/Output from previous day: 03/22 0701 - 03/23 0700 In: 3140 [P.O.:840; I.V.:2300] Out: 2675 [Urine:2675] Intake/Output this shift:    General: No acute distress.  Blood just drawn  Lungs: Clear to auscultation  Abd: Distended, good bowel sounds.  Extremities: No clinical signs or symptoms of DVT  Neuro: Intact  Lab Results: CBC   Recent Labs  08/12/13 0007 08/12/13 0307  08/13/13 0001 08/13/13 0230  WBC 19.1* 22.6*  --   --   --   HGB 10.5* 10.7*  < > 8.2* 8.3*  HCT 30.2* 30.7*  < > 23.3* 23.6*  PLT 164 171  --   --   --   < > = values in this interval not displayed. BMET  Recent Labs  08/11/13 1229 08/12/13 0307  NA 129* 135*  K 4.3 3.8  CL 85* 99  CO2 20 22  GLUCOSE 376* 134*  BUN 14 15  CREATININE 0.91 0.74  CALCIUM 10.0 8.4   PT/INR  Recent Labs  08/11/13 1445 08/12/13 0307  LABPROT 15.0 14.1  INR 1.21 1.11   ABG No results found for this basename: PHART, PCO2, PO2, HCO3,  in the last 72 hours  Studies/Results: Ct Head Wo Contrast  08/11/2013   CLINICAL DATA:  Fall  EXAM: CT HEAD WITHOUT CONTRAST  CT CERVICAL SPINE WITHOUT CONTRAST  TECHNIQUE: Multidetector CT imaging of the head and cervical spine was performed following the standard protocol without intravenous contrast. Multiplanar CT image reconstructions of the cervical spine were also generated.  COMPARISON:  CT HEAD W/O CM dated 03/12/2010; CT ABD/PELVIS W CM dated 08/11/2013  FINDINGS: CT HEAD FINDINGS  Chronic ischemic changes. Mild global atrophy. No mass effect, midline  shift, or acute intracranial hemorrhage. Mastoid air cells and visualized paranasal sinuses are clear. Intact cranium  CT CERVICAL SPINE FINDINGS  Anatomic alignment. No acute fracture. No obvious soft tissue injury. Normal thyroid gland. No evidence of spinal hemorrhage.  IMPRESSION: No acute intracranial pathology.  No evidence of acute bony injury in the cervical spine.   Electronically Signed   By: Maryclare Bean M.D.   On: 08/11/2013 13:29   Ct Chest W Contrast  08/11/2013   CLINICAL DATA:  Fall  Fall  EXAM: CT CHEST, ABDOMEN, AND PELVIS WITH CONTRAST  TECHNIQUE: Multidetector CT imaging of the chest, abdomen and pelvis was performed following the standard protocol during bolus administration of intravenous contrast.  CONTRAST:  120mL OMNIPAQUE IOHEXOL 300 MG/ML  SOLN  COMPARISON:  DG CHEST 1V PORT dated 08/11/2013; CT CHEST W/CM dated 03/12/2010  FINDINGS: CT CHEST FINDINGS  No evidence of mediastinal hemorrhage or abnormal adenopathy. No evidence of aortic injury.  No pneumothorax.  No pleural effusion.  Right lower lobe calcified granuloma on image 22 is stable. Hazy scarring in the adjacent right upper lobe on image 22. Linear scarring at the right lung and left lung bases. Hazy airspace opacities at the dependent left lung base are present. These were noted previously, and overall, they are improved.  No acute bony deformity.  CT ABDOMEN  AND PELVIS FINDINGS  There is a large laceration in the liver extending to the capsule of the dome from measuring 6.0 x 12.2 x 9.8 cm. There is associated intraperitoneal hemorrhage surrounding the liver as well as the spleen. There is no delayed contrast pooling to suggest acute extravasation.  Small lesion in the medial segment of the left lobe of the liver on image 76 of series 3 is heterogeneous with hypodensity and enhancement. This may simply represent a small hemangioma.  Spleen, gallbladder, pancreas, adrenal glands are within normal limits.  Kidneys are lobulated.  Prominent cystic structure in the central left kidney represents an extrarenal pelvis. No evidence of renal injury.  Small hiatal hernia.  There is moderate intraperitoneal hemorrhage within the lower abdomen and pelvis.  Bladder and uterus are within normal limits. Adnexa are within normal limits otherwise.  No acute bony deformity.  IMPRESSION: No evidence of acute intrathoracic injury.  Large liver laceration associated with a large amount of intraperitoneal hemorrhage.  Critical Value/emergent results were called by telephone at the time of interpretation on 08/11/2013 at 1:39 PM to Dr. Francine Graven , who verbally acknowledged these results.   Electronically Signed   By: Maryclare Bean M.D.   On: 08/11/2013 13:40   Ct Cervical Spine Wo Contrast  08/11/2013   CLINICAL DATA:  Fall  EXAM: CT HEAD WITHOUT CONTRAST  CT CERVICAL SPINE WITHOUT CONTRAST  TECHNIQUE: Multidetector CT imaging of the head and cervical spine was performed following the standard protocol without intravenous contrast. Multiplanar CT image reconstructions of the cervical spine were also generated.  COMPARISON:  CT HEAD W/O CM dated 03/12/2010; CT ABD/PELVIS W CM dated 08/11/2013  FINDINGS: CT HEAD FINDINGS  Chronic ischemic changes. Mild global atrophy. No mass effect, midline shift, or acute intracranial hemorrhage. Mastoid air cells and visualized paranasal sinuses are clear. Intact cranium  CT CERVICAL SPINE FINDINGS  Anatomic alignment. No acute fracture. No obvious soft tissue injury. Normal thyroid gland. No evidence of spinal hemorrhage.  IMPRESSION: No acute intracranial pathology.  No evidence of acute bony injury in the cervical spine.   Electronically Signed   By: Maryclare Bean M.D.   On: 08/11/2013 13:29   Ct Abdomen Pelvis W Contrast  08/11/2013   CLINICAL DATA:  Fall  Fall  EXAM: CT CHEST, ABDOMEN, AND PELVIS WITH CONTRAST  TECHNIQUE: Multidetector CT imaging of the chest, abdomen and pelvis was performed following the standard  protocol during bolus administration of intravenous contrast.  CONTRAST:  160mL OMNIPAQUE IOHEXOL 300 MG/ML  SOLN  COMPARISON:  DG CHEST 1V PORT dated 08/11/2013; CT CHEST W/CM dated 03/12/2010  FINDINGS: CT CHEST FINDINGS  No evidence of mediastinal hemorrhage or abnormal adenopathy. No evidence of aortic injury.  No pneumothorax.  No pleural effusion.  Right lower lobe calcified granuloma on image 22 is stable. Hazy scarring in the adjacent right upper lobe on image 22. Linear scarring at the right lung and left lung bases. Hazy airspace opacities at the dependent left lung base are present. These were noted previously, and overall, they are improved.  No acute bony deformity.  CT ABDOMEN AND PELVIS FINDINGS  There is a large laceration in the liver extending to the capsule of the dome from measuring 6.0 x 12.2 x 9.8 cm. There is associated intraperitoneal hemorrhage surrounding the liver as well as the spleen. There is no delayed contrast pooling to suggest acute extravasation.  Small lesion in the medial segment of the left lobe of the liver  on image 76 of series 3 is heterogeneous with hypodensity and enhancement. This may simply represent a small hemangioma.  Spleen, gallbladder, pancreas, adrenal glands are within normal limits.  Kidneys are lobulated. Prominent cystic structure in the central left kidney represents an extrarenal pelvis. No evidence of renal injury.  Small hiatal hernia.  There is moderate intraperitoneal hemorrhage within the lower abdomen and pelvis.  Bladder and uterus are within normal limits. Adnexa are within normal limits otherwise.  No acute bony deformity.  IMPRESSION: No evidence of acute intrathoracic injury.  Large liver laceration associated with a large amount of intraperitoneal hemorrhage.  Critical Value/emergent results were called by telephone at the time of interpretation on 08/11/2013 at 1:39 PM to Dr. Francine Graven , who verbally acknowledged these results.    Electronically Signed   By: Maryclare Bean M.D.   On: 08/11/2013 13:40   Dg Chest Port 1 View  08/11/2013   CLINICAL DATA:  Fall  EXAM: PORTABLE CHEST - 1 VIEW  COMPARISON:  01/09/2013  FINDINGS: Normal heart size.  Lungs hyper aerated and clear.  No pneumothorax.  IMPRESSION: No active disease.   Electronically Signed   By: Maryclare Bean M.D.   On: 08/11/2013 13:21    Anti-infectives: Anti-infectives   None      Assessment/Plan: s/p  Advance diet Decrease IVFs History of chronic hyponatremia and SIADH, controlled with fluid restriction Continued bedrest Stop frequent blood checks. Transfer to the floor this afternoon.  LOS: 2 days   Kathryne Eriksson. Dahlia Bailiff, MD, FACS [phone] Trauma Surgeon 08/13/2013

## 2013-08-13 NOTE — Progress Notes (Signed)
PULMONARY / CRITICAL CARE MEDICINE   Name: Candice Gray MRN: [id number] DOB: [birth year]    ADMISSION DATE:  08/11/2013 CONSULTATION DATE:  08/13/2013  REFERRING MD :  Trauma PRIMARY SERVICE: Trauma  CHIEF COMPLAINT:  SIADH  BRIEF PATIENT DESCRIPTION:   58 year old female who fell and hit the side of her chest and abdomen on a dresser resulting in rib fractures and liver laceration. Patient has SIADH and critical care was consult to assess the electrolyte disturbances  SIGNIFICANT EVENTS / STUDIES:  03/21 CT scan of abdomen >>> Liver laceration with contained subcapsular hematoma  LINES / TUBES:  CULTURES:  ANTIBIOTICS:  INTERVAL HISTORY:  No acute events overnight.  No complaints.  Na normalizing.  VITAL SIGNS: Temp:  [98.1 F (36.7 C)-98.7 F (37.1 C)] 98.2 F (36.8 C) (03/23 0748) Pulse Rate:  [83-107] 83 (03/23 0751) Resp:  [13-24] 13 (03/23 0751) BP: (119-146)/(63-93) 142/92 mmHg (03/23 0751) SpO2:  [99 %-100 %] 100 % (03/23 0751)  HEMODYNAMICS:   VENTILATOR SETTINGS:   INTAKE / OUTPUT: Intake/Output     03/22 0701 - 03/23 0700 03/23 0701 - 03/24 0700   P.O. 840    I.V. (mL/kg) 2300 (40.6)    Total Intake(mL/kg) 3140 (55.4)    Urine (mL/kg/hr) 2675 (2)    Total Output 2675     Net +465           PHYSICAL EXAMINATION: General:  Comfortable, no distress Neuro:  Awake, alert, cooperative HEENT:  Moist membranes Cardiovascular:  Regular, no murmurs Lungs:  CTAB Abdomen:  Tender right upper quadrant Musculoskeletal:  Moves all extremities Skin:  No rash  LABS:  CBC  Recent Labs Lab 08/11/13 1840 08/12/13 0007 08/12/13 0307  08/13/13 0001 08/13/13 0230 08/13/13 0854  WBC 22.0* 19.1* 22.6*  --   --   --   --   HGB 11.5* 10.5* 10.7*  < > 8.2* 8.3* 9.1*  HCT 33.1* 30.2* 30.7*  < > 23.3* 23.6* 26.1*  PLT 92* 164 171  --   --   --   --   < > = values in this interval not displayed. Coag's  Recent Labs Lab 08/11/13 1445 08/12/13 0307  INR  1.21 1.11   BMET  Recent Labs Lab 08/11/13 1126 08/11/13 1229 08/12/13 0307  NA 129* 129* 135*  K 4.2 4.3 3.8  CL 91* 85* 99  CO2  --  20 22  BUN 14 14 15   CREATININE 1.10 0.91 0.74  GLUCOSE 371* 376* 134*   Electrolytes  Recent Labs Lab 08/11/13 1229 08/12/13 0307  CALCIUM 10.0 8.4  MG 1.8  --    Sepsis Markers  Recent Labs Lab 08/11/13 1234 08/11/13 1840 08/12/13 0307  LATICACIDVEN 6.6* 1.2 1.0   ABG No results found for this basename: PHART, PCO2ART, PO2ART,  in the last 168 hours  Liver Enzymes  Recent Labs Lab 08/11/13 1229  AST 640*  ALT 517*  ALKPHOS 66  BILITOT 0.8  ALBUMIN 3.8   Cardiac Enzymes  Recent Labs Lab 08/11/13 1229  TROPONINI 0.73*   Glucose  Recent Labs Lab 08/11/13 1732 08/11/13 2002 08/11/13 2227 08/11/13 2341 08/12/13 0359 08/12/13 0744  GLUCAP 161* 129* 155* 142* 145* 124*   IMAGING:   Ct Head Wo Contrast  08/11/2013   CLINICAL DATA:  Fall  EXAM: CT HEAD WITHOUT CONTRAST  CT CERVICAL SPINE WITHOUT CONTRAST  TECHNIQUE: Multidetector CT imaging of the head and cervical spine was performed following the standard protocol without  intravenous contrast. Multiplanar CT image reconstructions of the cervical spine were also generated.  COMPARISON:  CT HEAD W/O CM dated 03/12/2010; CT ABD/PELVIS W CM dated 08/11/2013  FINDINGS: CT HEAD FINDINGS  Chronic ischemic changes. Mild global atrophy. No mass effect, midline shift, or acute intracranial hemorrhage. Mastoid air cells and visualized paranasal sinuses are clear. Intact cranium  CT CERVICAL SPINE FINDINGS  Anatomic alignment. No acute fracture. No obvious soft tissue injury. Normal thyroid gland. No evidence of spinal hemorrhage.  IMPRESSION: No acute intracranial pathology.  No evidence of acute bony injury in the cervical spine.   Electronically Signed   By: Maryclare Bean M.D.   On: 08/11/2013 13:29   Ct Chest W Contrast  08/11/2013   CLINICAL DATA:  Fall  Fall  EXAM: CT CHEST,  ABDOMEN, AND PELVIS WITH CONTRAST  TECHNIQUE: Multidetector CT imaging of the chest, abdomen and pelvis was performed following the standard protocol during bolus administration of intravenous contrast.  CONTRAST:  134mL OMNIPAQUE IOHEXOL 300 MG/ML  SOLN  COMPARISON:  DG CHEST 1V PORT dated 08/11/2013; CT CHEST W/CM dated 03/12/2010  FINDINGS: CT CHEST FINDINGS  No evidence of mediastinal hemorrhage or abnormal adenopathy. No evidence of aortic injury.  No pneumothorax.  No pleural effusion.  Right lower lobe calcified granuloma on image 22 is stable. Hazy scarring in the adjacent right upper lobe on image 22. Linear scarring at the right lung and left lung bases. Hazy airspace opacities at the dependent left lung base are present. These were noted previously, and overall, they are improved.  No acute bony deformity.  CT ABDOMEN AND PELVIS FINDINGS  There is a large laceration in the liver extending to the capsule of the dome from measuring 6.0 x 12.2 x 9.8 cm. There is associated intraperitoneal hemorrhage surrounding the liver as well as the spleen. There is no delayed contrast pooling to suggest acute extravasation.  Small lesion in the medial segment of the left lobe of the liver on image 76 of series 3 is heterogeneous with hypodensity and enhancement. This may simply represent a small hemangioma.  Spleen, gallbladder, pancreas, adrenal glands are within normal limits.  Kidneys are lobulated. Prominent cystic structure in the central left kidney represents an extrarenal pelvis. No evidence of renal injury.  Small hiatal hernia.  There is moderate intraperitoneal hemorrhage within the lower abdomen and pelvis.  Bladder and uterus are within normal limits. Adnexa are within normal limits otherwise.  No acute bony deformity.  IMPRESSION: No evidence of acute intrathoracic injury.  Large liver laceration associated with a large amount of intraperitoneal hemorrhage.  Critical Value/emergent results were called by  telephone at the time of interpretation on 08/11/2013 at 1:39 PM to Dr. Francine Graven , who verbally acknowledged these results.   Electronically Signed   By: Maryclare Bean M.D.   On: 08/11/2013 13:40   Ct Cervical Spine Wo Contrast  08/11/2013   CLINICAL DATA:  Fall  EXAM: CT HEAD WITHOUT CONTRAST  CT CERVICAL SPINE WITHOUT CONTRAST  TECHNIQUE: Multidetector CT imaging of the head and cervical spine was performed following the standard protocol without intravenous contrast. Multiplanar CT image reconstructions of the cervical spine were also generated.  COMPARISON:  CT HEAD W/O CM dated 03/12/2010; CT ABD/PELVIS W CM dated 08/11/2013  FINDINGS: CT HEAD FINDINGS  Chronic ischemic changes. Mild global atrophy. No mass effect, midline shift, or acute intracranial hemorrhage. Mastoid air cells and visualized paranasal sinuses are clear. Intact cranium  CT CERVICAL SPINE FINDINGS  Anatomic alignment. No acute fracture. No obvious soft tissue injury. Normal thyroid gland. No evidence of spinal hemorrhage.  IMPRESSION: No acute intracranial pathology.  No evidence of acute bony injury in the cervical spine.   Electronically Signed   By: Maryclare Bean M.D.   On: 08/11/2013 13:29   Ct Abdomen Pelvis W Contrast  08/11/2013   CLINICAL DATA:  Fall  Fall  EXAM: CT CHEST, ABDOMEN, AND PELVIS WITH CONTRAST  TECHNIQUE: Multidetector CT imaging of the chest, abdomen and pelvis was performed following the standard protocol during bolus administration of intravenous contrast.  CONTRAST:  176mL OMNIPAQUE IOHEXOL 300 MG/ML  SOLN  COMPARISON:  DG CHEST 1V PORT dated 08/11/2013; CT CHEST W/CM dated 03/12/2010  FINDINGS: CT CHEST FINDINGS  No evidence of mediastinal hemorrhage or abnormal adenopathy. No evidence of aortic injury.  No pneumothorax.  No pleural effusion.  Right lower lobe calcified granuloma on image 22 is stable. Hazy scarring in the adjacent right upper lobe on image 22. Linear scarring at the right lung and left lung  bases. Hazy airspace opacities at the dependent left lung base are present. These were noted previously, and overall, they are improved.  No acute bony deformity.  CT ABDOMEN AND PELVIS FINDINGS  There is a large laceration in the liver extending to the capsule of the dome from measuring 6.0 x 12.2 x 9.8 cm. There is associated intraperitoneal hemorrhage surrounding the liver as well as the spleen. There is no delayed contrast pooling to suggest acute extravasation.  Small lesion in the medial segment of the left lobe of the liver on image 76 of series 3 is heterogeneous with hypodensity and enhancement. This may simply represent a small hemangioma.  Spleen, gallbladder, pancreas, adrenal glands are within normal limits.  Kidneys are lobulated. Prominent cystic structure in the central left kidney represents an extrarenal pelvis. No evidence of renal injury.  Small hiatal hernia.  There is moderate intraperitoneal hemorrhage within the lower abdomen and pelvis.  Bladder and uterus are within normal limits. Adnexa are within normal limits otherwise.  No acute bony deformity.  IMPRESSION: No evidence of acute intrathoracic injury.  Large liver laceration associated with a large amount of intraperitoneal hemorrhage.  Critical Value/emergent results were called by telephone at the time of interpretation on 08/11/2013 at 1:39 PM to Dr. Francine Graven , who verbally acknowledged these results.   Electronically Signed   By: Maryclare Bean M.D.   On: 08/11/2013 13:40   Dg Chest Port 1 View  08/11/2013   CLINICAL DATA:  Fall  EXAM: PORTABLE CHEST - 1 VIEW  COMPARISON:  01/09/2013  FINDINGS: Normal heart size.  Lungs hyper aerated and clear.  No pneumothorax.  IMPRESSION: No active disease.   Electronically Signed   By: Maryclare Bean M.D.   On: 08/11/2013 13:21   ASSESSMENT / PLAN:  Liver laceration secondary to fall Anemia of acute blood loss  Per Trauma servive SIAHD, acute on chronic, Na normalizing  Continue fluid  restrictions  Trend BMP  PCCM will sign off.  Please re consult if necessary.  I have personally obtained history, examined patient, evaluated and interpreted laboratory and imaging results, reviewed medical records, formulated assessment / plan and placed orders.  Doree Fudge, MD Pulmonary and [place] Pager: [phone]  08/13/2013, 11:07 AM

## 2013-08-14 DIAGNOSIS — D62 Acute posthemorrhagic anemia: Secondary | ICD-10-CM | POA: Diagnosis not present

## 2013-08-14 DIAGNOSIS — W19XXXA Unspecified fall, initial encounter: Secondary | ICD-10-CM | POA: Diagnosis present

## 2013-08-14 LAB — BASIC METABOLIC PANEL
BUN: 6 mg/dL (ref 6–23)
CO2: 27 mEq/L (ref 19–32)
Calcium: 8.8 mg/dL (ref 8.4–10.5)
Chloride: 97 mEq/L (ref 96–112)
Creatinine, Ser: 0.63 mg/dL (ref 0.50–1.10)
GFR calc Af Amer: >90 mL/min (ref >90)
Glucose, Bld: 121 mg/dL — ABNORMAL HIGH (ref 70–99)
POTASSIUM: 3.3 meq/L — AB (ref 3.7–5.3)
SODIUM: 135 meq/L — AB (ref 137–147)

## 2013-08-14 LAB — CBC WITH DIFFERENTIAL/PLATELET
BASOS PCT: 0 % (ref 0–1)
Basophils Absolute: 0 10*3/uL (ref 0.0–0.1)
EOS ABS: 0 10*3/uL (ref 0.0–0.7)
Eosinophils Relative: 0 % (ref 0–5)
HCT: 23.7 % — ABNORMAL LOW (ref 36.0–46.0)
Hemoglobin: 8.2 g/dL — ABNORMAL LOW (ref 12.0–15.0)
Lymphocytes Relative: 9 % — ABNORMAL LOW (ref 12–46)
Lymphs Abs: 1 10*3/uL (ref 0.7–4.0)
MCH: 33.6 pg (ref 26.0–34.0)
MCHC: 34.6 g/dL (ref 30.0–36.0)
MCV: 97.1 fL (ref 78.0–100.0)
Monocytes Absolute: 1.1 10*3/uL — ABNORMAL HIGH (ref 0.1–1.0)
Monocytes Relative: 10 % (ref 3–12)
NEUTROS PCT: 81 % — AB (ref 43–77)
Neutro Abs: 9.3 10*3/uL — ABNORMAL HIGH (ref 1.7–7.7)
PLATELETS: 128 10*3/uL — AB (ref 150–400)
RBC: 2.44 MIL/uL — ABNORMAL LOW (ref 3.87–5.11)
RDW: 13 % (ref 11.5–15.5)
WBC: 11.5 10*3/uL — ABNORMAL HIGH (ref 4.0–10.5)

## 2013-08-14 MED ORDER — TAB-A-VITE/IRON PO TABS
1.0000 | ORAL_TABLET | Freq: Every day | ORAL | Status: DC
  Administered 2013-08-14 – 2013-08-16 (×3): 1 via ORAL
  Filled 2013-08-14 (×4): qty 1

## 2013-08-14 MED ORDER — AMLODIPINE BESYLATE 2.5 MG PO TABS
2.5000 mg | ORAL_TABLET | Freq: Every day | ORAL | Status: DC
  Administered 2013-08-14 – 2013-08-16 (×3): 2.5 mg via ORAL
  Filled 2013-08-14 (×3): qty 1

## 2013-08-14 MED ORDER — POTASSIUM CHLORIDE CRYS ER 20 MEQ PO TBCR
20.0000 meq | EXTENDED_RELEASE_TABLET | Freq: Two times a day (BID) | ORAL | Status: DC
  Administered 2013-08-14 – 2013-08-16 (×5): 20 meq via ORAL
  Filled 2013-08-14 (×7): qty 1

## 2013-08-14 MED ORDER — OXYCODONE HCL 5 MG PO TABS
5.0000 mg | ORAL_TABLET | ORAL | Status: DC | PRN
  Administered 2013-08-14 – 2013-08-16 (×9): 10 mg via ORAL
  Filled 2013-08-14 (×9): qty 2

## 2013-08-14 MED ORDER — METOPROLOL SUCCINATE ER 25 MG PO TB24
25.0000 mg | ORAL_TABLET | Freq: Every day | ORAL | Status: DC
  Administered 2013-08-14 – 2013-08-16 (×3): 25 mg via ORAL
  Filled 2013-08-14 (×3): qty 1

## 2013-08-14 MED ORDER — BISACODYL 5 MG PO TBEC
10.0000 mg | DELAYED_RELEASE_TABLET | Freq: Every day | ORAL | Status: DC
  Administered 2013-08-14 – 2013-08-16 (×3): 10 mg via ORAL
  Filled 2013-08-14 (×4): qty 2

## 2013-08-14 MED ORDER — MORPHINE SULFATE 2 MG/ML IJ SOLN: 2.0000 mg | INTRAMUSCULAR | Status: DC | PRN

## 2013-08-14 NOTE — Progress Notes (Signed)
Patient is stable.  Will allow to ambulate tomorrow and possibly home on Thursday.  This patient has been seen and I agree with the findings and treatment plan.  Candice Gray. Dahlia Bailiff, MD, [place] [phone] (pager) [phone] (direct pager) Trauma Surgeon

## 2013-08-14 NOTE — Progress Notes (Signed)
UR completed.  Graydon Fofana, RN BSN MHA CCM Trauma/Neuro ICU Case Manager [phone]  

## 2013-08-14 NOTE — Progress Notes (Signed)
Patient ID: Candice Gray, female   DOB: May 22, 1956, 58 y.o.   MRN: [id number]   LOS: 3 days   Subjective: No new c/o. Pain controlled.   Objective: Vital signs in last 24 hours: Temp:  [98.2 F (36.8 C)-99.2 F (37.3 C)] 98.5 F (36.9 C) (03/24 0642) Pulse Rate:  [83-93] 83 (03/24 0642) Resp:  [12-20] 17 (03/24 0642) BP: (133-183)/(78-95) 183/87 mmHg (03/24 0642) SpO2:  [98 %-100 %] 98 % (03/24 0642) Last BM Date: 08/13/13   Laboratory  CBC  Recent Labs  08/12/13 0307  08/13/13 0854 08/14/13 0445  WBC 22.6*  --   --  11.5*  HGB 10.7*  < > 9.1* 8.2*  HCT 30.7*  < > 26.1* 23.7*  PLT 171  --   --  128*  < > = values in this interval not displayed. BMET  Recent Labs  08/12/13 0307 08/14/13 0445  NA 135* 135*  K 3.8 3.3*  CL 99 97  CO2 22 27  GLUCOSE 134* 121*  BUN 15 6  CREATININE 0.74 0.63  CALCIUM 8.4 8.8    Physical Exam General appearance: alert and no distress Resp: clear to auscultation bilaterally Cardio: regular rate and rhythm GI: normal findings: bowel sounds normal and soft   Assessment/Plan: Fall Friday night  Liver laceration -- D3/3 of bedrest, will give [facility] privileges ABL anemia - Suspect hgb of 9.1 yesterday was spurious as last several draws have been between 8 and 8.5 SIADH - Na+ stable HTN -- Will schedule home meds, HTN will not help hemostasis FEN - Replace K+, SL IV VTE - SCD's Dispo -- Bedrest    Lisette Abu, PA-C Pager: [phone] General Trauma PA Pager: [phone]  08/14/2013

## 2013-08-14 NOTE — Clinical Social Work Note (Signed)
Clinical Social Work Department BRIEF PSYCHOSOCIAL ASSESSMENT 08/14/2013  Patient:  Candice Gray, Candice Gray     Account Number:  [id number]     [place] date:  08/11/2013  Clinical Social Worker:  Myles Lipps  Date/Time:  08/14/2013 04:00 PM  Referred by:  RN  Date Referred:  08/14/2013 Referred for  Psychosocial assessment   Other Referral:   Interview type:  Patient Other interview type:   No family/friends available at bedside    PSYCHOSOCIAL DATA Living Status:  FAMILY Admitted from facility:   Level of care:   Primary support name:  Evamarie, Raetz  [phone] Primary support relationship to patient:  SPOUSE Degree of support available:   Strong    CURRENT CONCERNS Current Concerns  None Noted   Other Concerns:    SOCIAL WORK ASSESSMENT / PLAN Clinical Social Worker met with patient at bedside to offer support and discuss patient needs at discharge.  Patient states that she was walking down a dark hallway in her house when she tripped over a tv and landed on a speaker. Patient states that her son and husband asked her to go to urgent care Friday evening when it happened but patient waited until Saturday morning.  Patient states that she lives at home with her husband and 27 and 61 year old sons. Patient is not currently employed and has intermittent supervision from her family.  Patient plans to return home with family and has adequate transportation at discharge.    Clinical Social Worker inquired about current substance use.  Patient states that she likes to occassionally drink beer and had had one the night of the fall.  Patient states that she will no longer be drinking due to the injury to her liver.  Patient declines the need for resources.  SBIRT completed.  CSW signing off at this time.  Please reconsult if further needs arise prior to discharge.   Assessment/plan status:  No Further Intervention Required Other assessment/ plan:   Information/referral to community  resources:   [place] representative offered patient resources - patient politely declined stating that she has adequate resources through her family.    PATIENT'S/FAMILY'S RESPONSE TO PLAN OF CARE: Patient alert and oriented x3 sitting up in bed.  Patient states that she is a very active person with the dog and in her yard and so having to remain on bedrest has been a real challenge.  Patient feels that her family will provide strong emotional and physical support to her at discharge. Patient does verbalize concerns about the possibility of falling again but plans to be much more careful following discharge.  Patient verbalized her understanding of social work role and her appreciation for CSW support and concern.

## 2013-08-15 DIAGNOSIS — S2239XA Fracture of one rib, unspecified side, initial encounter for closed fracture: Secondary | ICD-10-CM

## 2013-08-15 DIAGNOSIS — S2231XA Fracture of one rib, right side, initial encounter for closed fracture: Secondary | ICD-10-CM | POA: Diagnosis present

## 2013-08-15 LAB — BASIC METABOLIC PANEL
BUN: 5 mg/dL — ABNORMAL LOW (ref 6–23)
CO2: 27 mEq/L (ref 19–32)
Calcium: 9.1 mg/dL (ref 8.4–10.5)
Chloride: 93 mEq/L — ABNORMAL LOW (ref 96–112)
Creatinine, Ser: 0.54 mg/dL (ref 0.50–1.10)
GLUCOSE: 119 mg/dL — AB (ref 70–99)
POTASSIUM: 3.5 meq/L — AB (ref 3.7–5.3)
SODIUM: 132 meq/L — AB (ref 137–147)

## 2013-08-15 LAB — CBC
HCT: 24.5 % — ABNORMAL LOW (ref 36.0–46.0)
Hemoglobin: 8.5 g/dL — ABNORMAL LOW (ref 12.0–15.0)
MCH: 33.9 pg (ref 26.0–34.0)
MCHC: 34.7 g/dL (ref 30.0–36.0)
MCV: 97.6 fL (ref 78.0–100.0)
Platelets: 152 K/uL (ref 150–400)
RBC: 2.51 MIL/uL — ABNORMAL LOW (ref 3.87–5.11)
RDW: 12.9 % (ref 11.5–15.5)
WBC: 12.1 K/uL — ABNORMAL HIGH (ref 4.0–10.5)

## 2013-08-15 MED ORDER — SODIUM CHLORIDE 1 G PO TABS
1.0000 g | ORAL_TABLET | Freq: Three times a day (TID) | ORAL | Status: DC
  Administered 2013-08-15 – 2013-08-16 (×3): 1 g via ORAL
  Filled 2013-08-15 (×7): qty 1

## 2013-08-15 NOTE — Progress Notes (Signed)
Hopefully she will do well with ambulation today and be able to go home tomorrow.  This patient has been seen and I agree with the findings and treatment plan.  Kathryne Eriksson. Dahlia Bailiff, MD, [place] [phone] (pager) [phone] (direct pager) Trauma Surgeon

## 2013-08-15 NOTE — Progress Notes (Signed)
Patient ID: Candice Gray, female   DOB: [birth year], 58 y.o.   MRN: [id number]   LOS: 4 days   Subjective: Abd slightly worse.   Objective: Vital signs in last 24 hours: Temp:  [98.3 F (36.8 C)-98.5 F (36.9 C)] 98.3 F (36.8 C) (03/25 0607) Pulse Rate:  [89-95] 91 (03/25 0607) Resp:  [16-18] 17 (03/25 0607) BP: (126-159)/(80-91) 150/87 mmHg (03/25 0607) SpO2:  [99 %-100 %] 100 % (03/25 0607) Last BM Date: 08/11/13 (last Sat 3/21 per pt)   Laboratory  CBC  Recent Labs  08/14/13 0445 08/15/13 0530  WBC 11.5* 12.1*  HGB 8.2* 8.5*  HCT 23.7* 24.5*  PLT 128* 152   BMET  Recent Labs  08/14/13 0445 08/15/13 0530  NA 135* 132*  K 3.3* 3.5*  CL 97 93*  CO2 27 27  GLUCOSE 121* 119*  BUN 6 5*  CREATININE 0.63 0.54  CALCIUM 8.8 9.1    Physical Exam General appearance: alert and no distress Resp: clear to auscultation bilaterally Cardio: regular rate and rhythm GI: Soft, +BS, mild TTP   Assessment/Plan: Fall  Right rib fx -- Pulmonary toilet Liver laceration -- Hgb stable, ambulate today ABL anemia - Stable SIADH - Add NaCl tabs  HTN -- Will schedule home meds, HTN will not help hemostasis  FEN - NaCl tabs, K+ improved VTE - SCD's  Dispo -- Ambulate today    Lisette Abu, PA-C Pager: [phone] General Trauma PA Pager: [phone]  08/15/2013

## 2013-08-16 LAB — BASIC METABOLIC PANEL
BUN: 5 mg/dL — ABNORMAL LOW (ref 6–23)
CO2: 27 meq/L (ref 19–32)
Calcium: 9.3 mg/dL (ref 8.4–10.5)
Chloride: 92 mEq/L — ABNORMAL LOW (ref 96–112)
Creatinine, Ser: 0.55 mg/dL (ref 0.50–1.10)
GFR calc Af Amer: >90 mL/min (ref >90)
GFR calc non Af Amer: >90 mL/min (ref >90)
GLUCOSE: 117 mg/dL — AB (ref 70–99)
POTASSIUM: 3.8 meq/L (ref 3.7–5.3)
SODIUM: 130 meq/L — AB (ref 137–147)

## 2013-08-16 LAB — CBC
HCT: 23.4 % — ABNORMAL LOW (ref 36.0–46.0)
HEMOGLOBIN: 8.1 g/dL — AB (ref 12.0–15.0)
MCH: 33.6 pg (ref 26.0–34.0)
MCHC: 34.6 g/dL (ref 30.0–36.0)
MCV: 97.1 fL (ref 78.0–100.0)
Platelets: 162 10*3/uL (ref 150–400)
RBC: 2.41 MIL/uL — ABNORMAL LOW (ref 3.87–5.11)
RDW: 13 % (ref 11.5–15.5)
WBC: 10.4 10*3/uL (ref 4.0–10.5)

## 2013-08-16 MED ORDER — OXYCODONE-ACETAMINOPHEN 5-325 MG PO TABS: 1.0000 | ORAL_TABLET | ORAL | Status: DC | PRN

## 2013-08-16 NOTE — Discharge Summary (Signed)
Hemoglobin stable and patient ready to go home.  This patient has been seen and I agree with the findings and treatment plan.  Kathryne Eriksson. Dahlia Bailiff, MD, [place] [phone] (pager) [phone] (direct pager) Trauma Surgeon

## 2013-08-16 NOTE — Progress Notes (Signed)
DC home with husband, verbally understood dc instructions, no questions asked

## 2013-08-16 NOTE — Discharge Instructions (Signed)
No driving while taking oxycodone.  No running, jumping, ball or contact sports, bikes, skateboards, etc for 3 months.

## 2013-08-16 NOTE — Discharge Summary (Signed)
Physician Discharge Summary  Patient ID: Candice Gray MRN: [id number] DOB/AGE: 01-08-1956 58 y.o.  Admit date: 08/11/2013 Discharge date: 08/16/2013  Discharge Diagnoses Patient Active Problem List   Diagnosis Date Noted  . Right rib fracture 08/15/2013  . Acute blood loss anemia 08/14/2013  . Fall 08/14/2013  . Liver laceration, major 08/11/2013  . Underweight 01/09/2013  . PREDIABETES 11/04/2009  . 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 HYPERLIPIDEMIA 08/13/2008  . HYPERTENSION, BENIGN ESSENTIAL, LABILE 08/13/2008  . MITRAL VALVE PROLAPSE 08/13/2008  . ALLERGIC RHINITIS, SEASONAL 08/13/2008  . DIVERTICULOSIS, SIGMOID COLON 08/13/2008  . NUMMULAR ECZEMA 08/13/2008  . Other osteoporosis 08/13/2008    Consultants Candice Stain, MD  (Pulmonary/Critical Care Medicine)  Procedures None   Hospital Course:  Candice Gray is a 58 y.o. female who presented to [facility] with malaise and right side pain. She had fallen onto a speaker cabinet on 08/10/13 and hadn't felt well since. She proceeded to [facility] after waking up on 08/11/13 with N/V, polydipsia, and persistent RUQ pain with lateral chest wall pain. She was then transferred to [facility].  Workup showed large liver laceration with a large amount of intraperitoneal hemorrhage.  Patient was admitted and placed on three days of bedrest.  Diet was advanced as tolerated.  On HD #4, the patient was having regular flatus, tolerating diet, ambulating well, pain well controlled, vital signs stable, hemoglobin stable, and felt stable for discharge home.  Patient will not need follow up in our office but knows to call with questions or concerns. Pt will f/u with PCP for monitoring of labs.   Physical Exam: Filed Vitals:   08/16/13 1001  BP: 136/86  Pulse: 102  Temp: 98.2 F (36.8 C)  Resp: 16   General:  WDWN female in NAD Cor: RRR. No G/R noted. MVP murmur not appreciated. Pt states it is only pronounced when her BP is elevated. Normal S1 and S2.  Lungs: CTA  bilaterally. Non tender. Normal Effort. Abdomen: Mild distension. Tender to RUQ. + BS.       Medication List    ASK your doctor about these medications       amLODipine 2.5 MG tablet  Commonly known as:  NORVASC  Take 2.5 mg by mouth daily as needed (based on BP).     Biotin 1 MG Caps  Take 1 tablet by mouth daily.     CALTRATE 600 PLUS-VIT D PO  Take 1 tablet by mouth daily.     ibandronate 150 MG tablet  Commonly known as:  BONIVA  Take 1 tablet (150 mg total) by mouth every 30 (thirty) days. Take in the morning with a full glass of water, on an empty stomach, and do not take anything else by mouth or lie down for the next 30 min.     latanoprost 0.005 % ophthalmic solution  Commonly known as:  XALATAN  Place 1 drop into both eyes at bedtime.     metoprolol succinate 25 MG 24 hr tablet  Commonly known as:  TOPROL-XL  Take 25 mg by mouth daily as needed (based on BP).     vitamin C 500 MG tablet  Commonly known as:  ASCORBIC ACID  Take 500 mg by mouth daily.          Signed: Barrington Ellison, PA-S [facility]  Trauma Service [phone]  08/16/2013, 9:47 AM

## 2013-08-29 ENCOUNTER — Telehealth (HOSPITAL_COMMUNITY): Payer: Self-pay

## 2013-08-29 NOTE — Telephone Encounter (Signed)
Candice Gray called to see if she could take something non-narcotic for pain. I recommended Aleve 440mg  bid and APAP 650mg  q4h prn pain. She says she also still seems swollen there. I told her that if that worsened or she was still having symptoms after 3 months to call and we would probably need to get another CT scan.

## 2014-01-08 ENCOUNTER — Telehealth: Payer: Self-pay | Admitting: Family Medicine

## 2014-01-08 DIAGNOSIS — R7309 Other abnormal glucose: Secondary | ICD-10-CM

## 2014-01-08 DIAGNOSIS — E236 Other disorders of pituitary gland: Secondary | ICD-10-CM

## 2014-01-08 DIAGNOSIS — M818 Other osteoporosis without current pathological fracture: Secondary | ICD-10-CM

## 2014-01-08 DIAGNOSIS — E785 Hyperlipidemia, unspecified: Secondary | ICD-10-CM

## 2014-01-08 NOTE — Telephone Encounter (Signed)
Message copied by Jinny Sanders on Tue Jan 08, 2014  3:35 PM ------      Message from: Ellamae Sia      Created: Fri Jan 04, 2014  4:04 PM      Regarding: Lab orders for Thursday, 8.20.15       Patient is scheduled for CPX labs, please order future labs, Thanks , Candice Gray       ------

## 2014-01-10 ENCOUNTER — Other Ambulatory Visit (INDEPENDENT_AMBULATORY_CARE_PROVIDER_SITE_OTHER): Payer: BC Managed Care – PPO

## 2014-01-10 ENCOUNTER — Encounter: Payer: Self-pay | Admitting: Family Medicine

## 2014-01-10 ENCOUNTER — Telehealth: Payer: Self-pay | Admitting: Family Medicine

## 2014-01-10 DIAGNOSIS — R7309 Other abnormal glucose: Secondary | ICD-10-CM

## 2014-01-10 DIAGNOSIS — I10 Essential (primary) hypertension: Secondary | ICD-10-CM

## 2014-01-10 DIAGNOSIS — M818 Other osteoporosis without current pathological fracture: Secondary | ICD-10-CM

## 2014-01-10 DIAGNOSIS — D649 Anemia, unspecified: Secondary | ICD-10-CM

## 2014-01-10 DIAGNOSIS — E785 Hyperlipidemia, unspecified: Secondary | ICD-10-CM

## 2014-01-10 DIAGNOSIS — E236 Other disorders of pituitary gland: Secondary | ICD-10-CM

## 2014-01-10 LAB — COMPREHENSIVE METABOLIC PANEL
ALBUMIN: 4.4 g/dL (ref 3.5–5.2)
ALT: 14 U/L (ref 0–35)
AST: 24 U/L (ref 0–37)
Alkaline Phosphatase: 48 U/L (ref 39–117)
BUN: 10 mg/dL (ref 6–23)
CALCIUM: 10.8 mg/dL — AB (ref 8.4–10.5)
CHLORIDE: 96 meq/L (ref 96–112)
CO2: 30 mEq/L (ref 19–32)
Creatinine, Ser: 0.8 mg/dL (ref 0.4–1.2)
GFR: 81.75 mL/min (ref >60.00)
GLUCOSE: 99 mg/dL (ref 70–99)
POTASSIUM: 4.3 meq/L (ref 3.5–5.1)
Sodium: 136 mEq/L (ref 135–145)
Total Bilirubin: 0.7 mg/dL (ref 0.2–1.2)
Total Protein: 7.7 g/dL (ref 6.0–8.3)

## 2014-01-10 LAB — CBC WITH DIFFERENTIAL/PLATELET
Basophils Absolute: 0 10*3/uL (ref 0.0–0.1)
Basophils Relative: 0.3 % (ref 0.0–3.0)
Eosinophils Absolute: 0.1 10*3/uL (ref 0.0–0.7)
Eosinophils Relative: 0.6 % (ref 0.0–5.0)
HEMATOCRIT: 43.3 % (ref 36.0–46.0)
HEMOGLOBIN: 14.5 g/dL (ref 12.0–15.0)
LYMPHS ABS: 2.1 10*3/uL (ref 0.7–4.0)
LYMPHS PCT: 17.8 % (ref 12.0–46.0)
MCHC: 33.4 g/dL (ref 30.0–36.0)
MCV: 97 fl (ref 78.0–100.0)
MONOS PCT: 7.5 % (ref 3.0–12.0)
Monocytes Absolute: 0.9 10*3/uL (ref 0.1–1.0)
NEUTROS ABS: 8.8 10*3/uL — AB (ref 1.4–7.7)
Neutrophils Relative %: 73.8 % (ref 43.0–77.0)
PLATELETS: 241 10*3/uL (ref 150.0–400.0)
RBC: 4.47 Mil/uL (ref 3.87–5.11)
RDW: 15 % (ref 11.5–15.5)
WBC: 11.9 10*3/uL — ABNORMAL HIGH (ref 4.0–10.5)

## 2014-01-10 LAB — LIPID PANEL
CHOLESTEROL: 229 mg/dL — AB (ref 0–200)
HDL: 136.8 mg/dL (ref >39.00)
LDL Cholesterol: 83 mg/dL (ref 0–99)
NonHDL: 92.2
TRIGLYCERIDES: 46 mg/dL (ref 0.0–149.0)
Total CHOL/HDL Ratio: 2
VLDL: 9.2 mg/dL (ref 0.0–40.0)

## 2014-01-10 LAB — VITAMIN D 25 HYDROXY (VIT D DEFICIENCY, FRACTURES): VITD: 50.89 ng/mL (ref 30.00–100.00)

## 2014-01-10 LAB — HEMOGLOBIN A1C: HEMOGLOBIN A1C: 6.2 % (ref 4.6–6.5)

## 2014-01-10 NOTE — Telephone Encounter (Signed)
Relevant patient education assigned to patient using Emmi. ° °

## 2014-01-17 ENCOUNTER — Encounter: Payer: Self-pay | Admitting: Family Medicine

## 2014-01-17 ENCOUNTER — Ambulatory Visit (INDEPENDENT_AMBULATORY_CARE_PROVIDER_SITE_OTHER): Payer: BC Managed Care – PPO | Admitting: Family Medicine

## 2014-01-17 VITALS — BP 136/87 | HR 76 | Temp 98.5°F | Ht 68.5 in | Wt 122.0 lb

## 2014-01-17 DIAGNOSIS — E785 Hyperlipidemia, unspecified: Secondary | ICD-10-CM

## 2014-01-17 DIAGNOSIS — E236 Other disorders of pituitary gland: Secondary | ICD-10-CM

## 2014-01-17 DIAGNOSIS — I10 Essential (primary) hypertension: Secondary | ICD-10-CM

## 2014-01-17 DIAGNOSIS — Z0000 Encounter for general adult medical examination without abnormal findings: Secondary | ICD-10-CM

## 2014-01-17 DIAGNOSIS — M810 Age-related osteoporosis without current pathological fracture: Secondary | ICD-10-CM

## 2014-01-17 DIAGNOSIS — R636 Underweight: Secondary | ICD-10-CM

## 2014-01-17 NOTE — Assessment & Plan Note (Signed)
Well controlled on no med. 

## 2014-01-17 NOTE — Assessment & Plan Note (Signed)
Well controlled currently on no med. HAs metoprolol and amlodipine to use prn

## 2014-01-17 NOTE — Progress Notes (Signed)
Pre visit review using our clinic review tool, if applicable. No additional management support is needed unless otherwise documented below in the visit note. 

## 2014-01-17 NOTE — Patient Instructions (Addendum)
Hold calcium for a few a days. Stop at front desk to set up bone density. Work on [employer] and regular exercise.

## 2014-01-17 NOTE — Assessment & Plan Note (Signed)
Stable

## 2014-01-17 NOTE — Progress Notes (Signed)
58 year old female  The patient is here for annual wellness exam and preventative care.    She had recent ER visit this past spring  following an accidental fall  result in in a liver laceration. No remaining pain. Hemoglobin back in nml range.  Wt Readings from Last 3 Encounters:  01/17/14 122 lb (55.339 kg)  08/11/13 125 lb (56.7 kg)  06/05/13 131 lb 3.2 oz (59.512 kg)    Hypertension: Well controlled still OFF amlodipine and metoprolol .Followed by Dr. Rex Kras  BP Readings from Last 3 Encounters:  01/17/14 136/87  08/16/13 136/86  08/11/13 128/91  Using medication without problems or lightheadedness: None  Chest pain with exertion: None  Edema:None  Short of breath:None  Average home KWI:OXBD controlled.  Other issues:None   Elevated Cholesterol: LDL at goal <130, and HDL good, high.  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  Having her continue fluid restriction.  Nml sodium on labs today.  Osteoporosis: On boniva since 2013 DEXA at that time showed osteopenia  On ca supplement.   Review of Systems  Constitutional: Negative for fever and fatigue.  HENT: Negative for ear pain.  Eyes: Negative for pain.  Respiratory: Negative for chest tightness and shortness of breath.  Cardiovascular: Negative for chest pain, palpitations and leg swelling.  Gastrointestinal: Negative for abdominal pain.  Genitourinary: Negative for dysuria.  Objective:   Physical Exam  Constitutional: Vital signs are normal. She appears well-developed and well-nourished. She is cooperative. Non-toxic appearance. She does not appear ill. No distress.  HENT: Poor dentition, dental caries and discoloration.  Head: Normocephalic.  Right Ear: Hearing,  tympanic membrane, external ear and ear canal normal.  Left Ear: Hearing, tympanic membrane, external ear and ear canal normal.  Nose: Nose normal.  Eyes: Conjunctivae, EOM and lids are normal. Pupils are equal, round, and reactive to light. No foreign bodies found.  Neck: Trachea normal and normal range of motion. Neck supple. Carotid bruit is not present. No mass and no thyromegaly present.  Cardiovascular: Normal rate, regular rhythm, S1 normal, S2 normal, normal heart sounds and intact distal pulses. Exam reveals no gallop.  No murmur heard.  Pulmonary/Chest: Effort normal and breath sounds normal. No respiratory distress. She has no wheezes. She has no rhonchi. She has no rales.  Abdominal: Soft. Normal appearance and bowel sounds are normal. She exhibits no distension, no fluid wave, no abdominal bruit and no mass. There is no hepatosplenomegaly. There is no tenderness. There is no rebound, no guarding and no CVA tenderness. No hernia.  Genitourinary: Vagina normal and uterus normal. No breast swelling, tenderness, discharge or bleeding.  There is no rash, tenderness or lesion on the right labia. There is no rash, tenderness or lesion on the left labia. Uterus is not enlarged and not tender. Right adnexum displays no mass, no tenderness and no fullness. Left adnexum displays no mass, no tenderness and no fullness. PAP NOT performed.  Lymphadenopathy:  She has no cervical adenopathy.  She has no axillary adenopathy.  Neurological: She is alert. She has normal strength. No cranial nerve deficit or sensory deficit.  Skin: Skin is warm, dry and intact. No rash  noted.  Psychiatric: Her speech is normal and behavior is normal. Judgment normal. Her mood appears not anxious. Cognition and memory are normal. She does not exhibit a depressed mood.  Assessment & Plan:   Complete Physical Exam: The patient's preventative maintenance and recommended screening tests for an annual wellness exam were reviewed  in full today.  Brought up to date unless services declined.  Counselled on the importance of diet, exercise, and its role in overall health and mortality.  The patient's FH and SH was reviewed, including their home life, tobacco status, and drug and alcohol status.   Last DEXA  2013 osteopenia on boniva has been on for 3 years. Last colon: 2011 was bengin polyps.. No repeat screen in this way given small caliber colon. Further eval per Dr. Olevia Perches. Likely in 10 years ifob.  UTD with vaccines Td 2010, refuses flu Mammo nml 12/2013 Pap q 3 years, last nml in 2013 with DVE.    Counseled against marijuana use.  Nonsmoker

## 2014-01-17 NOTE — Assessment & Plan Note (Signed)
Improving.

## 2014-01-30 ENCOUNTER — Encounter: Payer: Self-pay | Admitting: Family Medicine

## 2014-02-12 ENCOUNTER — Telehealth: Payer: Self-pay

## 2014-02-12 NOTE — Telephone Encounter (Signed)
Pt left v.m; pt received information about bone density and wants to ask additional questions about other possible treatments.Spoke with pt; Pt has done research on prolia; pt has reviewed the 3 scans sent to pt; improvement was seen between first and second bone density that was done in 2011 and 2013; these test were done at [facility] and 2015 bone density was done at [facility]. Pt wants to know if there could be a difference in technique or equipment between [facility]. Why would her body not be absorbing calcium at this point; pt has been taking Boniva as instructed . prolia has too many possible side effects and pt does not want to take Prolia. Pt exercises regularly. Pt sees endocrinology for sodium; pt has SIADH. Pt wants to know if Dr Diona Browner thinks she needs to see endocrinologist for bone loss or to see what other medication options pt would have other than Prolia.Please advise. Pt request cb today if possible.

## 2014-02-12 NOTE — Telephone Encounter (Signed)
Agree. Pt can discuss her bone density results with endo for other options.  As far as a difference between locations, I believe they should be the same within error. Only was to know would be to keep following  at same location for change.

## 2014-02-12 NOTE — Telephone Encounter (Signed)
Ms. Heindl notified as instructed by telephone. 

## 2014-06-18 ENCOUNTER — Encounter: Payer: Self-pay | Admitting: Internal Medicine

## 2014-06-18 ENCOUNTER — Ambulatory Visit (INDEPENDENT_AMBULATORY_CARE_PROVIDER_SITE_OTHER): Payer: BLUE CROSS/BLUE SHIELD | Admitting: Internal Medicine

## 2014-06-18 VITALS — BP 164/98 | HR 64 | Ht 68.5 in | Wt 130.5 lb

## 2014-06-18 DIAGNOSIS — E785 Hyperlipidemia, unspecified: Secondary | ICD-10-CM

## 2014-06-18 DIAGNOSIS — I10 Essential (primary) hypertension: Secondary | ICD-10-CM

## 2014-06-18 DIAGNOSIS — I059 Rheumatic mitral valve disease, unspecified: Secondary | ICD-10-CM

## 2014-06-18 MED ORDER — AMLODIPINE BESYLATE 2.5 MG PO TABS: 2.5000 mg | ORAL_TABLET | Freq: Every day | ORAL | Status: DC

## 2014-06-18 NOTE — Patient Instructions (Addendum)
Your physician has recommended you make the following change in your medication..  1. DON'T take metoprolol  2. TAKE amlodipine 2.5mg  daily   Your physician wants you to follow-up in: 1 year with Dr. Debara Pickett. You will receive a reminder letter in the mail two months in advance. If you don't receive a letter, please call our office to schedule the follow-up appointment.  Dr. Benjamine Mola (ENT)

## 2014-06-18 NOTE — Progress Notes (Signed)
OFFICE NOTE  Chief Complaint:  No complaints  Primary Care Physician: Eliezer Lofts, MD  HPI:  Candice Gray  is a 59 year old female with a history of hypertension, however, it seems to be somewhat situational and provoked by anxiety. In fact, she tends to chase her blood pressures, taking medications only as needed. Most of the time actually she has no evidence of elevated blood pressure and very rarely takes medications. One can argue this is maybe only situational hypertension. She has also had an unusual hyponatremia and is followed by Delrae Rend, as you know, for this issue. Overall other than some anxiety no specific complaints today.  Notable Raynaud's phenomenon which is apparent in both hands. There is additionally blushing of both cheeks which could suggest vasodilatation. I understand she has had workup by rheumatologist and has had other connective tissue disorders such as lupus ruled out.  I saw Ms. Tomson back in the office today for follow-up. She reports over the past several months her blood pressure has been elevated. She feels like is related to cold weather. She does have significant Raynaud's symptoms today. She's been taking low-dose amlodipine as well as a beta blocker as needed. She feels that she is cooler and is more tired when her blood pressure is elevated but also feels fatigued with a blood pressure gets too low. Based on this narrow range she is titrating her own blood pressure medications. Unfortunately in March of last year she suffered an accidental trauma with liver laceration that spontaneously resolved. Finally, she reports some sinus pressure today. She has a history of sinus pressure and infections. She's been told up as she has a deviated nasal septum. She is entertained surgery but did not want to go ahead with it in the past. She does report snoring is concerned about apnea. The etiology may be her deep deviated septum. I've encouraged her to seek out an  ENT to be reevaluated.   PMHx:  Past Medical History  Diagnosis Date  . Hyperlipemia   . Diverticulitis   . Diverticulosis   . HTN (hypertension)   . MVP (mitral valve prolapse)   . SIADH (syndrome of inappropriate ADH production)   . Deviated nasal septum   . Hyperplastic colon polyp   . Anxiety   . Hyponatremia   . Raynaud phenomenon   . Hyponatremia     Past Surgical History  Procedure Laterality Date  . Cryotherapy      for cervical dysplasia  . Transthoracic echocardiogram  11/2007    EF=>55%; mild MR; trace TR  . Nm myocar perf wall motion  11/2007    bruce myoview; perfusion defect in anterior myocardium (breast attenuation); post-stress EF 77%; normal, low risk study     FAMHx:  Family History  Problem Relation Age of Onset  . Hyperlipidemia Father   . Hypertension Father   . Diabetes Father   . Diabetes Paternal Grandmother   . Cancer Paternal Grandfather     brain tumor  . Hyperlipidemia Other   . Hypertension Other   . Colon cancer Neg Hx   . Coronary artery disease Maternal Grandmother   . Hyperlipidemia Brother     SOCHx:   reports that she quit smoking about 16 years ago. Her smoking use included Cigarettes. She has never used smokeless tobacco. She reports that she drinks about 8.4 oz of alcohol per week. She reports that she uses illicit drugs (Marijuana).  ALLERGIES:  Allergies  Allergen Reactions  .  Azelastine Hcl     REACTION: increased bp  . Miralax [Polyethylene Glycol] Itching  . Red Dye Swelling  . Zyban [Bupropion] Swelling    ROS: A comprehensive review of systems was negative.  HOME MEDS: Current Outpatient Prescriptions  Medication Sig Dispense Refill  . amLODipine (NORVASC) 2.5 MG tablet Take 2.5 mg by mouth daily as needed (BP).     . Biotin 1 MG CAPS Take 1 tablet by mouth daily.    . Calcium-Vitamin D (CALTRATE 600 PLUS-VIT D PO) Take 1 tablet by mouth daily.    . Cholecalciferol (VITAMIN D-3 PO) Take 1 capsule by mouth  daily.    Marland Kitchen latanoprost (XALATAN) 0.005 % ophthalmic solution Place 1 drop into both eyes at bedtime.    . vitamin C (ASCORBIC ACID) 500 MG tablet Take 500 mg by mouth daily.     No current facility-administered medications for this visit.    LABS/IMAGING: No results found for this or any previous visit (from the past 48 hour(s)). No results found.  VITALS: BP 164/98 mmHg  Pulse 64  Ht 5' 8.5" (1.74 m)  Wt 130 lb 8 oz (59.194 kg)  BMI 19.55 kg/m2  EXAM: General appearance: alert and no distress Neck: no carotid bruit and no JVD Lungs: clear to auscultation bilaterally Heart: regular rate and rhythm, S1, S2 normal, no murmur, click, rub or gallop Abdomen: soft, non-tender; bowel sounds normal; no masses,  no organomegaly Extremities: extremities normal, atraumatic, no cyanosis or edema Pulses: 2+ and symmetric Skin: Skin color, texture, turgor normal. No rashes or lesions Neurologic: Grossly normal Psych: Mood, affect normal  EKG: Sinus rhythm at 64  ASSESSMENT: 1. Labile hypertension, secondary to stress, anxiety and autonomic stimuli 2. Dyslipidemia - diet controlled  PLAN: 1.   Mrs. Ergle is now taking low-dose amlodipine and occasional beta blockers for heart rate and blood pressure control. I think she benefit more from being on amlodipine given her Raynaud's. We'll go ahead and refill her low-dose amlodipine today. She continues to work on her cholesterol with dietary control. She reported some snoring and has a history of nasal septal deviation. She also has some sinus congestion. She should consider seeing an ENT again for reevaluation. I've given her the name of Dr. Benjamine Mola.  Plan to see her back annually or sooner as necessary.  Pixie Casino, MD, [facility] Attending Cardiologist [facility]  HILTY,Kenneth C 06/18/2014, 8:19 AM

## 2015-02-07 ENCOUNTER — Other Ambulatory Visit: Payer: BLUE CROSS/BLUE SHIELD

## 2015-02-11 ENCOUNTER — Telehealth: Payer: Self-pay | Admitting: Family Medicine

## 2015-02-11 ENCOUNTER — Other Ambulatory Visit (INDEPENDENT_AMBULATORY_CARE_PROVIDER_SITE_OTHER): Payer: BLUE CROSS/BLUE SHIELD

## 2015-02-11 DIAGNOSIS — E785 Hyperlipidemia, unspecified: Secondary | ICD-10-CM

## 2015-02-11 DIAGNOSIS — M810 Age-related osteoporosis without current pathological fracture: Secondary | ICD-10-CM | POA: Diagnosis not present

## 2015-02-11 LAB — COMPREHENSIVE METABOLIC PANEL
ALBUMIN: 4.2 g/dL (ref 3.5–5.2)
ALK PHOS: 49 U/L (ref 39–117)
ALT: 10 U/L (ref 0–35)
AST: 16 U/L (ref 0–37)
BUN: 10 mg/dL (ref 6–23)
CALCIUM: 10.4 mg/dL (ref 8.4–10.5)
CO2: 30 mEq/L (ref 19–32)
Chloride: 96 mEq/L (ref 96–112)
Creatinine, Ser: 0.69 mg/dL (ref 0.40–1.20)
GFR: 92.43 mL/min (ref >60.00)
Glucose, Bld: 94 mg/dL (ref 70–99)
POTASSIUM: 4.2 meq/L (ref 3.5–5.1)
Sodium: 132 mEq/L — ABNORMAL LOW (ref 135–145)
TOTAL PROTEIN: 7.1 g/dL (ref 6.0–8.3)
Total Bilirubin: 0.5 mg/dL (ref 0.2–1.2)

## 2015-02-11 LAB — LIPID PANEL
CHOLESTEROL: 191 mg/dL (ref 0–200)
HDL: 110.4 mg/dL (ref >39.00)
LDL Cholesterol: 70 mg/dL (ref 0–99)
NonHDL: 80.92
TRIGLYCERIDES: 56 mg/dL (ref 0.0–149.0)
Total CHOL/HDL Ratio: 2
VLDL: 11.2 mg/dL (ref 0.0–40.0)

## 2015-02-11 LAB — VITAMIN D 25 HYDROXY (VIT D DEFICIENCY, FRACTURES): VITD: 41.78 ng/mL (ref 30.00–100.00)

## 2015-02-11 NOTE — Telephone Encounter (Signed)
-----   Message from Ellamae Sia sent at 02/06/2015 10:47 AM EDT ----- Regarding: Lab orders for Tuesday, 9.20.16 Patient is scheduled for CPX labs, please order future labs, Thanks , Karna Christmas

## 2015-02-14 ENCOUNTER — Other Ambulatory Visit (HOSPITAL_COMMUNITY)
Admission: RE | Admit: 2015-02-14 | Discharge: 2015-02-14 | Disposition: A | Discharge status: Home / Self Care | Payer: BLUE CROSS/BLUE SHIELD | Source: Ambulatory Visit | Attending: Family Medicine | Admitting: Family Medicine

## 2015-02-14 ENCOUNTER — Ambulatory Visit (INDEPENDENT_AMBULATORY_CARE_PROVIDER_SITE_OTHER): Payer: BLUE CROSS/BLUE SHIELD | Admitting: Family Medicine

## 2015-02-14 ENCOUNTER — Encounter: Payer: Self-pay | Admitting: Family Medicine

## 2015-02-14 VITALS — BP 152/92 | HR 86 | Temp 98.5°F | Ht 68.5 in | Wt 124.0 lb

## 2015-02-14 DIAGNOSIS — Z01419 Encounter for gynecological examination (general) (routine) without abnormal findings: Secondary | ICD-10-CM | POA: Diagnosis present

## 2015-02-14 DIAGNOSIS — Z0000 Encounter for general adult medical examination without abnormal findings: Secondary | ICD-10-CM | POA: Diagnosis not present

## 2015-02-14 DIAGNOSIS — Z1151 Encounter for screening for human papillomavirus (HPV): Secondary | ICD-10-CM | POA: Diagnosis present

## 2015-02-14 DIAGNOSIS — M810 Age-related osteoporosis without current pathological fracture: Secondary | ICD-10-CM

## 2015-02-14 DIAGNOSIS — Z124 Encounter for screening for malignant neoplasm of cervix: Secondary | ICD-10-CM | POA: Diagnosis not present

## 2015-02-14 DIAGNOSIS — E785 Hyperlipidemia, unspecified: Secondary | ICD-10-CM

## 2015-02-14 DIAGNOSIS — I10 Essential (primary) hypertension: Secondary | ICD-10-CM

## 2015-02-14 DIAGNOSIS — E222 Syndrome of inappropriate secretion of antidiuretic hormone: Secondary | ICD-10-CM

## 2015-02-14 NOTE — Assessment & Plan Note (Signed)
Well controlled on no med. 

## 2015-02-14 NOTE — Progress Notes (Signed)
The patient is here for annual wellness exam and preventative care.   Occ dull pain in left lower rib cage.Marland Kitchen occ spasming if bending over. Noted since fell last year. No mass.   Wt Readings from Last 3 Encounters:  02/14/15 124 lb (56.246 kg)  06/18/14 130 lb 8 oz (59.194 kg)  01/17/14 122 lb (55.339 kg)   Hypertension: Poor control on measurement today but per last OV in 05/2014 with cardiology, Dr. Debara Pickett:  it seems to be somewhat situational and provoked by anxiety. In fact, she tends to chase her blood pressures, taking medications only as needed. Most of the time actually she has no evidence of elevated blood pressure and very rarely takes medications. She is using amlodipine 2.5 mg daily BP Readings from Last 3 Encounters:  02/14/15 152/92  06/18/14 164/98  01/17/14 136/87  Using medication without problems or lightheadedness: None  Chest pain with exertion: None  Edema:None  Short of breath:None  Average home HGD:JMEQ controlled.  Other issues:None   Elevated Cholesterol: LDL at goal <130, and HDL good, high.  Lab Results  Component Value Date   CHOL 191 02/11/2015   HDL 110.40 02/11/2015   LDLCALC 70 02/11/2015   LDLDIRECT 74.6 12/31/2011   TRIG 56.0 02/11/2015   CHOLHDL 2 02/11/2015  Doing YOGA weekly.  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 Having her continue fluid restriction.  Nml sodium on labs today.  Osteoporosis: On boniva since 2013 DEXA at that time showed osteopenia On ca supplement.   Review of Systems  Constitutional: Negative for fever and fatigue.  HENT: Negative for ear pain.  Eyes: Negative for pain.  Respiratory: Negative for chest tightness and shortness of breath.  Cardiovascular: Negative for chest pain, palpitations and leg swelling.  Gastrointestinal: Negative for abdominal pain.  Genitourinary: Negative for dysuria.  Objective:    Physical Exam  Constitutional: Vital signs are normal. She appears well-developed and well-nourished. She is cooperative. Non-toxic appearance. She does not appear ill. No distress.  HENT: Poor dentition, dental caries and discoloration.  Head: Normocephalic.  Right Ear: Hearing, tympanic membrane, external ear and ear canal normal.  Left Ear: Hearing, tympanic membrane, external ear and ear canal normal.  Nose: Nose normal.  Eyes: Conjunctivae, EOM and lids are normal. Pupils are equal, round, and reactive to light. No foreign bodies found.  Neck: Trachea normal and normal range of motion. Neck supple. Carotid bruit is not present. No mass and no thyromegaly present.  Cardiovascular: Normal rate, regular rhythm, S1 normal, S2 normal, normal heart sounds and intact distal pulses. Exam reveals no gallop.  No murmur heard.  Pulmonary/Chest: Effort normal and breath sounds normal. No respiratory distress. She has no wheezes. She has no rhonchi. She has no rales.  Abdominal: Soft. Normal appearance and bowel sounds are normal. She exhibits no distension, no fluid wave, no abdominal bruit and no mass. There is no hepatosplenomegaly. There is no tenderness. There is no rebound, no guarding and no CVA tenderness. No hernia.  Genitourinary: Vagina normal and uterus normal. No breast swelling, tenderness, discharge or bleeding. There is no rash, tenderness or lesion on the right labia. There is no rash, tenderness or lesion on the left labia. Uterus is not enlarged and not tender. Right adnexum displays no mass, no tenderness and no fullness. Left adnexum displays no mass, no tenderness and no fullness.  nml cervix without lesionsPAP  performed.  Lymphadenopathy:  She has no cervical adenopathy.  She has no axillary adenopathy.  Neurological: She is alert. She has normal strength. No cranial nerve deficit or sensory deficit.  Skin: Skin is warm, dry and intact. No rash noted.  Psychiatric: Her  speech is normal and behavior is normal. Judgment normal. Her mood appears not anxious. Cognition and memory are normal. She does not exhibit a depressed mood.  Assessment & Plan:   Complete Physical Exam: The patient's preventative maintenance and recommended screening tests for an annual wellness exam were reviewed in full today.  Brought up to date unless services declined.  Counselled on the importance of diet, exercise, and its role in overall health and mortality.  The patient's FH and SH was reviewed, including their home life, tobacco status, and drug and alcohol status.   Last DEXA 2015 worsening osteopenia, stopped boniva last year. ENDO treating.. No med at this time. Last colon: 2011 was bengin polyps.. No repeat screen in this way given small caliber colon. Further eval per Dr. Olevia Perches. Likely in 10 years ifob.  UTD with vaccines Td 2010, refuses flu Mammo nml 01/2015 nml Pap q 3 years, last nml in 2013 with DVE.  Due for it today. Counseled against marijuana use.  Nonsmoker

## 2015-02-14 NOTE — Assessment & Plan Note (Signed)
Stable. Water restriction. Followed by ENDO.

## 2015-02-14 NOTE — Patient Instructions (Signed)
Continue healthy eating , regular exercise.

## 2015-02-14 NOTE — Assessment & Plan Note (Signed)
Followed by cards on prn meds. Due fto anxiety.

## 2015-02-14 NOTE — Progress Notes (Signed)
Pre visit review using our clinic review tool, if applicable. No additional management support is needed unless otherwise documented below in the visit note. 

## 2015-02-14 NOTE — Addendum Note (Signed)
Addended by: Carter Kitten on: 02/14/2015 11:13 AM   Modules accepted: Orders

## 2015-02-17 LAB — CYTOLOGY - PAP

## 2015-02-18 ENCOUNTER — Encounter: Payer: Self-pay | Admitting: *Deleted

## 2015-06-20 ENCOUNTER — Encounter: Payer: Self-pay | Admitting: Internal Medicine

## 2015-06-20 ENCOUNTER — Ambulatory Visit (INDEPENDENT_AMBULATORY_CARE_PROVIDER_SITE_OTHER): Payer: BLUE CROSS/BLUE SHIELD | Admitting: Internal Medicine

## 2015-06-20 VITALS — BP 134/84 | HR 78 | Ht 68.0 in | Wt 127.4 lb

## 2015-06-20 DIAGNOSIS — R636 Underweight: Secondary | ICD-10-CM | POA: Diagnosis not present

## 2015-06-20 DIAGNOSIS — E785 Hyperlipidemia, unspecified: Secondary | ICD-10-CM

## 2015-06-20 DIAGNOSIS — I10 Essential (primary) hypertension: Secondary | ICD-10-CM | POA: Diagnosis not present

## 2015-06-20 DIAGNOSIS — R002 Palpitations: Secondary | ICD-10-CM | POA: Diagnosis not present

## 2015-06-20 DIAGNOSIS — I059 Rheumatic mitral valve disease, unspecified: Secondary | ICD-10-CM | POA: Diagnosis not present

## 2015-06-20 MED ORDER — AMLODIPINE BESYLATE 2.5 MG PO TABS: 2.5000 mg | ORAL_TABLET | Freq: Every day | ORAL | Status: DC

## 2015-06-20 NOTE — Patient Instructions (Signed)
Dr Hilty recommends that you follow-up with him as needed. 

## 2015-06-22 NOTE — Progress Notes (Signed)
OFFICE NOTE  Chief Complaint:  No complaints, doesn't know why she still needs bone density tests  Primary Care Physician: Eliezer Lofts, MD  HPI:  Candice Gray  is a 60 year old female with a history of hypertension, however, it seems to be somewhat situational and provoked by anxiety. In fact, she tends to chase her blood pressures, taking medications only as needed. Most of the time actually she has no evidence of elevated blood pressure and very rarely takes medications. One can argue this is maybe only situational hypertension. She has also had an unusual hyponatremia and is followed by Delrae Rend, as you know, for this issue. Overall other than some anxiety no specific complaints today.  Notable Raynaud's phenomenon which is apparent in both hands. There is additionally blushing of both cheeks which could suggest vasodilatation. I understand she has had workup by rheumatologist and has had other connective tissue disorders such as lupus ruled out.  I saw Candice Gray back in the office today for follow-up. She reports over the past several months her blood pressure has been elevated. She feels like is related to cold weather. She does have significant Raynaud's symptoms today. She's been taking low-dose amlodipine as well as a beta blocker as needed. She feels that she is cooler and is more tired when her blood pressure is elevated but also feels fatigued with a blood pressure gets too low. Based on this narrow range she is titrating her own blood pressure medications. Unfortunately in March of last year she suffered an accidental trauma with liver laceration that spontaneously resolved. Finally, she reports some sinus pressure today. She has a history of sinus pressure and infections. She's been told up as she has a deviated nasal septum. She is entertained surgery but did not want to go ahead with it in the past. She does report snoring is concerned about apnea. The etiology may be her deep  deviated septum. I've encouraged her to seek out an ENT to be reevaluated.  Candice Gray returns to the office today for follow-up. She's done fairly well without any chest pain or worsening shortness of breath. She still underweight although a little bit higher than she was in our visit last year. She complains of some Raynaud's symptoms and discoloration of her hands and takes between 1-2 tablets of low-dose Norvasc for this. She says it really isn't that helpful although she still takes the medicine. She denies any significant palpitations. Blood pressure appears to be well-controlled today.   PMHx:  Past Medical History  Diagnosis Date  . Hyperlipemia   . Diverticulitis   . Diverticulosis   . HTN (hypertension)   . MVP (mitral valve prolapse)   . SIADH (syndrome of inappropriate ADH production) ([place])   . Deviated nasal septum   . Hyperplastic colon polyp   . Anxiety   . Hyponatremia   . Raynaud phenomenon   . Hyponatremia     Past Surgical History  Procedure Laterality Date  . Cryotherapy      for cervical dysplasia  . Transthoracic echocardiogram  11/2007    EF=>55%; mild MR; trace TR  . Nm myocar perf wall motion  11/2007    bruce myoview; perfusion defect in anterior myocardium (breast attenuation); post-stress EF 77%; normal, low risk study     FAMHx:  Family History  Problem Relation Age of Onset  . Hyperlipidemia Father   . Hypertension Father   . Diabetes Father   . Diabetes Paternal Grandmother   .  Cancer Paternal Grandfather     brain tumor  . Hyperlipidemia Other   . Hypertension Other   . Colon cancer Neg Hx   . Coronary artery disease Maternal Grandmother   . Hyperlipidemia Brother     SOCHx:   reports that she quit smoking about 17 years ago. Her smoking use included Cigarettes. She has never used smokeless tobacco. She reports that she drinks about 8.4 oz of alcohol per week. She reports that she uses illicit drugs (Marijuana).  ALLERGIES:  Allergies    Allergen Reactions  . Azelastine Hcl     REACTION: increased bp  . Miralax [Polyethylene Glycol] Itching  . Red Dye Swelling  . Zyban [Bupropion] Swelling    ROS: A comprehensive review of systems was negative.  HOME MEDS: Current Outpatient Prescriptions  Medication Sig Dispense Refill  . amLODipine (NORVASC) 2.5 MG tablet Take 1 tablet (2.5 mg total) by mouth daily. 30 tablet 11  . Biotin 1 MG CAPS Take 1 tablet by mouth daily.    . Calcium-Vitamin D (CALTRATE 600 PLUS-VIT D PO) Take 1 tablet by mouth daily.    . Cholecalciferol (VITAMIN D-3 PO) Take 1 capsule by mouth daily.    Marland Kitchen latanoprost (XALATAN) 0.005 % ophthalmic solution Place 1 drop into both eyes at bedtime.    . vitamin C (ASCORBIC ACID) 500 MG tablet Take 500 mg by mouth daily.     No current facility-administered medications for this visit.    LABS/IMAGING: No results found for this or any previous visit (from the past 48 hour(s)). No results found.  VITALS: BP 134/84 mmHg  Pulse 78  Ht 5\' 8"  (1.727 m)  Wt 127 lb 7 oz (57.805 kg)  BMI 19.38 kg/m2  EXAM: General appearance: alert and no distress Neck: no carotid bruit and no JVD Lungs: clear to auscultation bilaterally Heart: regular rate and rhythm, S1, S2 normal, no murmur, click, rub or gallop Abdomen: soft, non-tender; bowel sounds normal; no masses,  no organomegaly Extremities: extremities normal, atraumatic, no cyanosis or edema Pulses: 2+ and symmetric Skin: Skin color, texture, turgor normal. No rashes or lesions Neurologic: Grossly normal Psych: Mood, affect normal  EKG: Sinus rhythm at 78  ASSESSMENT: 1. Labile hypertension, secondary to stress, anxiety and autonomic stimuli 2. Dyslipidemia - diet controlled 3. Raynaud's phenomena   PLAN: 1.   Candice Gray does not think that amlodipine is significantly helped her Raynaud's symptoms. Her blood pressure is well-controlled today but seems to be fairly labile. She may still want to take a  low-dose of the medicine to help with blood pressure. She has a remote history of mitral valve prolapse which was very mild and associated with palpitations. Murmur is not really audible. No further suggestions at this time. She can follow-up on an as-needed basis.  Pixie Casino, MD, [facility] Attending Cardiologist Olcott C Hilty 06/22/2015, 4:28 PM

## 2015-08-05 ENCOUNTER — Encounter: Payer: Self-pay | Admitting: Family Medicine

## 2015-08-05 ENCOUNTER — Ambulatory Visit (INDEPENDENT_AMBULATORY_CARE_PROVIDER_SITE_OTHER): Payer: BLUE CROSS/BLUE SHIELD | Admitting: Family Medicine

## 2015-08-05 VITALS — BP 110/64 | HR 78 | Temp 97.7°F | Ht 68.0 in | Wt 126.0 lb

## 2015-08-05 DIAGNOSIS — L259 Unspecified contact dermatitis, unspecified cause: Secondary | ICD-10-CM | POA: Insufficient documentation

## 2015-08-05 MED ORDER — METHYLPREDNISOLONE ACETATE 40 MG/ML IJ SUSP
40.0000 mg | Freq: Once | INTRAMUSCULAR | Status: AC
  Administered 2015-08-05: 40 mg via INTRAMUSCULAR

## 2015-08-05 MED ORDER — TRIAMCINOLONE ACETONIDE 0.5 % EX CREA: 1.0000 "application " | TOPICAL_CREAM | Freq: Two times a day (BID) | CUTANEOUS | Status: DC

## 2015-08-05 NOTE — Assessment & Plan Note (Signed)
Treat with steroid injection. Start antihistamine and topical steroid. Change from lotion to hypoallergenic cream moisturizer. If not improving in 2 weeks, call for derm referral.

## 2015-08-05 NOTE — Patient Instructions (Signed)
Start Claritin daily. Start topical steroid twice daily on face and leg. Change to Cetaphil cream twice daily for moisturizer. Consider humidifier. Call if not improving in 2 weeks.

## 2015-08-05 NOTE — Progress Notes (Signed)
   Subjective:    Patient ID: Candice Gray, female    DOB: March 25, 1956, 60 y.o.   MRN: [id number]  HPI   60 year old female with history of nummular eczema presents with new onset rash off and on at right leg behind knee and on face and around eyes x months, worse in last 2 weeks. Red dry rash on face, peeling, burning itchy.  Now in last morning noted spread to neck.  Has noted swelling around eyes as well.   She reports she noted it after triggered with makeup. May be worse also after using sunscreeen around eyes.   She has had it before but it has never been so bad.  Used mild clinique soaps, no changes. Using moisturizer.  Has tried benadryl. Hydrocortisone over the counter.  Social History /Family History/Past Medical History reviewed and updated if needed.   Review of Systems  Constitutional: Negative for fatigue.  HENT: Negative for ear pain.   Eyes: Negative for pain.  Respiratory: Negative for shortness of breath.   Cardiovascular: Negative for chest pain.       Objective:   Physical Exam  Constitutional: Vital signs are normal. She appears well-developed and well-nourished. She is cooperative.  Non-toxic appearance. She does not appear ill. No distress.  HENT:  Head: Normocephalic.  Right Ear: Hearing, tympanic membrane, external ear and ear canal normal. Tympanic membrane is not erythematous, not retracted and not bulging.  Left Ear: Hearing, tympanic membrane, external ear and ear canal normal. Tympanic membrane is not erythematous, not retracted and not bulging.  Nose: No mucosal edema or rhinorrhea. Right sinus exhibits no maxillary sinus tenderness and no frontal sinus tenderness. Left sinus exhibits no maxillary sinus tenderness and no frontal sinus tenderness.  Mouth/Throat: Uvula is midline, oropharynx is clear and moist and mucous membranes are normal.  Eyes: Conjunctivae, EOM and lids are normal. Pupils are equal, round, and reactive to light. Lids are  everted and swept, no foreign bodies found.  Neck: Trachea normal and normal range of motion. Neck supple. Carotid bruit is not present. No thyroid mass and no thyromegaly present.  Cardiovascular: Normal rate, regular rhythm, S1 normal, S2 normal, normal heart sounds, intact distal pulses and normal pulses.  Exam reveals no gallop and no friction rub.   No murmur heard. Pulmonary/Chest: Effort normal and breath sounds normal. No tachypnea. No respiratory distress. She has no decreased breath sounds. She has no wheezes. She has no rhonchi. She has no rales.  Abdominal: Soft. Normal appearance and bowel sounds are normal. There is no tenderness.  Neurological: She is alert.  Skin: Skin is warm, dry and intact. Rash noted. Rash is maculopapular. Rash is not vesicular.  Dry erythematous flaky skin around eyes, cheeks, neck and behind right knee  Psychiatric: Her speech is normal and behavior is normal. Judgment and thought content normal. Her mood appears not anxious. Cognition and memory are normal. She does not exhibit a depressed mood.          Assessment & Plan:

## 2015-08-05 NOTE — Progress Notes (Signed)
Pre visit review using our clinic review tool, if applicable. No additional management support is needed unless otherwise documented below in the visit note. 

## 2015-08-05 NOTE — Addendum Note (Signed)
Addended by: Pilar Grammes on: 08/05/2015 11:47 AM   Modules accepted: Orders

## 2015-10-07 DIAGNOSIS — H401131 Primary open-angle glaucoma, bilateral, mild stage: Secondary | ICD-10-CM | POA: Diagnosis not present

## 2015-10-07 DIAGNOSIS — M810 Age-related osteoporosis without current pathological fracture: Secondary | ICD-10-CM | POA: Diagnosis not present

## 2015-10-07 DIAGNOSIS — E222 Syndrome of inappropriate secretion of antidiuretic hormone: Secondary | ICD-10-CM | POA: Diagnosis not present

## 2015-12-02 ENCOUNTER — Telehealth: Payer: Self-pay | Admitting: Internal Medicine

## 2015-12-02 MED ORDER — AMLODIPINE BESYLATE 2.5 MG PO TABS: 2.5000 mg | ORAL_TABLET | Freq: Two times a day (BID) | ORAL | Status: DC

## 2015-12-02 NOTE — Telephone Encounter (Signed)
Would stick with 2.5 mg twice daily for now - go ahead and Rx for more medicine. If BP remains elevated, we can arrange for a hypertension clinic appointment.  Dr. Lemmie Evens

## 2015-12-02 NOTE — Telephone Encounter (Signed)
Returned call to patient and left detailed message, ok per DPR, on patient's answering machine with Dr [facility] recommendation. Rx sent to pharmacy. Advised patient to call back if she has any questions.

## 2015-12-02 NOTE — Telephone Encounter (Signed)
New message       *STAT* If patient is at the pharmacy, call can be transferred to refill team.   1. Which medications need to be refilled? (please list name of each medication and dose if known) amlopidine 2.5mg -----pt is taking 2 pills twice a day on most days causing her to run out of medication before the presc expires.  She takes 1 pill in am and about 1 hr later takes another pill.  Her bp this  am was 147/100 when she first got up.  Should she take a pill in the am and a pill in the pm?  She sees her PCP in the fall and they will take over refilling bp medication.  Please call and let her know what to do?  2. Which pharmacy/location (including street and city if local pharmacy) is medication to be sent to? [facility]. Do they need a 30 day or 90 day supply? 90 if ins would pay

## 2015-12-02 NOTE — Telephone Encounter (Signed)
Returned call to patient. She states she is running short of her RX for amlodipine each month due to the fact she takes an extra pill about 3-4 days out of the week.  She says her BP was 147/100, HR 77 this morning and generally runs 150/100 in the morning before taking her amlodipine. She will then take an extra pill an hour or two later if her BP is still elevated.  Today at 1100 her BP was 136/95 about 3 hours after taking amlodipine 2.5 mg at 0800. She stated, "Sometimes I feel like I'm about to jump out of my skin and I'm electrically charged." This happens when she notes her BP is elevated. This has been happening more lately and she wonders if she should take more medicine. She has on occasion taken a 3rd tablet of amlodipine. Right now she stated she feels fine. She has an appt with her PCP but it is not until the fall.  Will route to Dr Debara Pickett for advice.

## 2016-01-28 DIAGNOSIS — Z1231 Encounter for screening mammogram for malignant neoplasm of breast: Secondary | ICD-10-CM | POA: Diagnosis not present

## 2016-01-28 DIAGNOSIS — E210 Primary hyperparathyroidism: Secondary | ICD-10-CM | POA: Diagnosis not present

## 2016-01-28 DIAGNOSIS — M810 Age-related osteoporosis without current pathological fracture: Secondary | ICD-10-CM | POA: Diagnosis not present

## 2016-01-28 LAB — HM DEXA SCAN

## 2016-01-29 ENCOUNTER — Encounter: Payer: Self-pay | Admitting: Family Medicine

## 2016-02-25 DIAGNOSIS — M810 Age-related osteoporosis without current pathological fracture: Secondary | ICD-10-CM | POA: Diagnosis not present

## 2016-02-25 DIAGNOSIS — E222 Syndrome of inappropriate secretion of antidiuretic hormone: Secondary | ICD-10-CM | POA: Diagnosis not present

## 2016-02-25 DIAGNOSIS — E210 Primary hyperparathyroidism: Secondary | ICD-10-CM | POA: Diagnosis not present

## 2016-03-05 ENCOUNTER — Other Ambulatory Visit (INDEPENDENT_AMBULATORY_CARE_PROVIDER_SITE_OTHER): Payer: BLUE CROSS/BLUE SHIELD

## 2016-03-05 ENCOUNTER — Telehealth: Payer: Self-pay | Admitting: Family Medicine

## 2016-03-05 DIAGNOSIS — E785 Hyperlipidemia, unspecified: Secondary | ICD-10-CM | POA: Diagnosis not present

## 2016-03-05 DIAGNOSIS — Z1159 Encounter for screening for other viral diseases: Secondary | ICD-10-CM

## 2016-03-05 LAB — LIPID PANEL
CHOL/HDL RATIO: 2
Cholesterol: 249 mg/dL — ABNORMAL HIGH (ref 0–200)
HDL: 144.1 mg/dL (ref >39.00)
LDL CALC: 94 mg/dL (ref 0–99)
NonHDL: 104.52
TRIGLYCERIDES: 52 mg/dL (ref 0.0–149.0)
VLDL: 10.4 mg/dL (ref 0.0–40.0)

## 2016-03-05 LAB — COMPREHENSIVE METABOLIC PANEL
ALT: 11 U/L (ref 0–35)
AST: 21 U/L (ref 0–37)
Albumin: 4.6 g/dL (ref 3.5–5.2)
Alkaline Phosphatase: 51 U/L (ref 39–117)
BUN: 11 mg/dL (ref 6–23)
CALCIUM: 10.5 mg/dL (ref 8.4–10.5)
CHLORIDE: 95 meq/L — AB (ref 96–112)
CO2: 30 meq/L (ref 19–32)
CREATININE: 0.71 mg/dL (ref 0.40–1.20)
GFR: 89.11 mL/min (ref >60.00)
Glucose, Bld: 123 mg/dL — ABNORMAL HIGH (ref 70–99)
Potassium: 4.6 mEq/L (ref 3.5–5.1)
SODIUM: 133 meq/L — AB (ref 135–145)
Total Bilirubin: 0.6 mg/dL (ref 0.2–1.2)
Total Protein: 7.4 g/dL (ref 6.0–8.3)

## 2016-03-05 NOTE — Telephone Encounter (Signed)
-----   Message from Ellamae Sia sent at 02/25/2016  3:35 PM EDT ----- Regarding: Lab orders for Friday, 10.13.17 Patient is scheduled for CPX labs, please order future labs, Thanks , Karna Christmas

## 2016-03-06 LAB — HEPATITIS C ANTIBODY: HCV Ab: NEGATIVE

## 2016-03-12 ENCOUNTER — Ambulatory Visit (INDEPENDENT_AMBULATORY_CARE_PROVIDER_SITE_OTHER): Payer: BLUE CROSS/BLUE SHIELD | Admitting: Family Medicine

## 2016-03-12 ENCOUNTER — Encounter: Payer: Self-pay | Admitting: Family Medicine

## 2016-03-12 VITALS — BP 152/94 | HR 97 | Temp 98.0°F | Ht 68.25 in | Wt 124.5 lb

## 2016-03-12 DIAGNOSIS — R636 Underweight: Secondary | ICD-10-CM

## 2016-03-12 DIAGNOSIS — E222 Syndrome of inappropriate secretion of antidiuretic hormone: Secondary | ICD-10-CM

## 2016-03-12 DIAGNOSIS — M810 Age-related osteoporosis without current pathological fracture: Secondary | ICD-10-CM

## 2016-03-12 DIAGNOSIS — E785 Hyperlipidemia, unspecified: Secondary | ICD-10-CM

## 2016-03-12 DIAGNOSIS — R7303 Prediabetes: Secondary | ICD-10-CM | POA: Diagnosis not present

## 2016-03-12 DIAGNOSIS — Z0000 Encounter for general adult medical examination without abnormal findings: Secondary | ICD-10-CM | POA: Diagnosis not present

## 2016-03-12 DIAGNOSIS — I10 Essential (primary) hypertension: Secondary | ICD-10-CM

## 2016-03-12 NOTE — Assessment & Plan Note (Signed)
On fluid restriction.

## 2016-03-12 NOTE — Assessment & Plan Note (Signed)
Protein in nml range. Encouraged continue healthy eating.

## 2016-03-12 NOTE — Assessment & Plan Note (Signed)
At goal on no medication. 

## 2016-03-12 NOTE — Assessment & Plan Note (Signed)
Work on low [employer].

## 2016-03-12 NOTE — Patient Instructions (Addendum)
Follow BP at home.. call if running > 140/90.  Decrease carbohydrates in diet.

## 2016-03-12 NOTE — Progress Notes (Signed)
Pre visit review using our clinic review tool, if applicable. No additional management support is needed unless otherwise documented below in the visit note. 

## 2016-03-12 NOTE — Assessment & Plan Note (Signed)
Restarting BONIVA. Recheck DEXA in 2019.

## 2016-03-12 NOTE — Assessment & Plan Note (Signed)
Continue higher dose amlodipine. Follow BPs at home.

## 2016-03-12 NOTE — Progress Notes (Signed)
The patient is here for annual wellness exam and preventative care.   Hypertension: Poor control on measurement today but it seems to be somewhat situational and provoked by anxiety. She tends to chase her blood pressures, taking medications only as needed. Most of the time actually she has no evidence of elevated blood pressure and very rarely takes medications.  She feels BP up due to stress. She is using amlodipine 2.5 mg BID. BP Readings from Last 3 Encounters:  03/12/16 (!) 152/94  08/05/15 110/64  06/20/15 134/84  Using medication without problems or lightheadedness: None  Chest pain with exertion: None  Edema:None  Short of breath:occ Average home BPs:  120/70 Other issues:None   Elevated Cholesterol: LDL at goal <130, and HDL good, high.  Lab Results  Component Value Date   CHOL 249 (H) 03/05/2016   HDL 144.10 03/05/2016   LDLCALC 94 03/05/2016   LDLDIRECT 74.6 12/31/2011   TRIG 52.0 03/05/2016   CHOLHDL 2 03/05/2016   Doing YOGA weekly.  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 Having her continue fluid restriction.  Nml sodium on labs today.  Osteoporosis: Now off boniva since 2013 DEXA at that time showed osteopenia.. Given worsening .Marland Kitchen She plans to restart boniva now. On ca supplement.  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  New dx of prediabetes:  Glucose 123  PGM with DM, Father with prediabetes.  Social History /Family History/Past Medical History reviewed and updated if needed.  Review of Systems  Constitutional: Negative for fever and fatigue.  HENT: Negative for ear pain.  Eyes: Negative for pain.  Respiratory: Negative for chest tightness and shortness of breath.  Cardiovascular: Negative for chest pain, palpitations and leg swelling.  Gastrointestinal: Negative for abdominal pain.  Genitourinary: Negative for dysuria.  Objective:   Physical Exam  Constitutional: Vital signs are normal. She appears  well-developed and well-nourished. She is cooperative. Non-toxic appearance. She does not appear ill. No distress.  HENT: Poor dentition, dental caries and discoloration.  Head: Normocephalic.  Right Ear: Hearing, tympanic membrane, external ear and ear canal normal.  Left Ear: Hearing, tympanic membrane, external ear and ear canal normal.  Nose: Nose normal.  Eyes: Conjunctivae, EOM and lids are normal. Pupils are equal, round, and reactive to light. No foreign bodies found.  Neck: Trachea normal and normal range of motion. Neck supple. Carotid bruit is not present. No mass and no thyromegaly present.  Cardiovascular: Normal rate, regular rhythm, S1 normal, S2 normal, normal heart sounds and intact distal pulses. Exam reveals no gallop.  No murmur heard.  Pulmonary/Chest: Effort normal and breath sounds normal. No respiratory distress. She has no wheezes. She has no rhonchi. She has no rales.  Abdominal: Soft. Normal appearance and bowel sounds are normal. She exhibits no distension, no fluid wave, no abdominal bruit and no mass. There is no hepatosplenomegaly. There is no tenderness. There is no rebound, no guarding and no CVA tenderness. No hernia.  Genitourinary: NOT EXAMINED TODAY  Lymphadenopathy:  She has no cervical adenopathy.  She has no axillary adenopathy.  Neurological: She is alert. She has normal strength. No cranial nerve deficit or sensory deficit.  Skin: Skin is warm, dry and intact. No rash noted.  Psychiatric: Her speech is normal and behavior is normal. Judgment normal. Her mood appears not anxious. Cognition and memory are normal. She does not exhibit a depressed mood.  Assessment & Plan:   Complete Physical Exam: The patient's preventative maintenance and recommended  screening tests for an annual wellness exam were reviewed in full today.  Brought up to date unless services declined.  Counselled on the importance of diet, exercise, and its role in overall  health and mortality.  The patient's FH and SH was reviewed, including their home life, tobacco status, and drug and alcohol status.   Last DEXA 2015 worsening osteopenia, stopped boniva. ENDO treating.. No med at this time. Last colon: 2011 was bengin polyps.. No repeat screen in this way given small caliber colon. Further eval per Dr. Olevia Perches. Likely in 10 years ifob.  UTD with vaccines Td 2010, refuses flu, shingles  DEXA: osteoporosis followed by ENDO. Most recent DEXA 01/2016: slightly worsen. Going to restart Bonvia now. Mammo nml 01/2016 nml Pap q 5 years, last nml in 2016, neg HPV, plan DVE  Every other year, asymptomatic and no family history. Counseled against marijuana use.  Nonsmoker Hep C: neg

## 2016-03-19 ENCOUNTER — Encounter: Payer: Self-pay | Admitting: Family Medicine

## 2016-03-25 ENCOUNTER — Telehealth: Payer: Self-pay

## 2016-03-25 ENCOUNTER — Emergency Department (HOSPITAL_COMMUNITY): Payer: BLUE CROSS/BLUE SHIELD

## 2016-03-25 ENCOUNTER — Observation Stay (HOSPITAL_COMMUNITY)
Admission: EM | Admit: 2016-03-25 | Discharge: 2016-03-27 | Disposition: A | Discharge status: Home / Self Care | Payer: BLUE CROSS/BLUE SHIELD | Attending: Internal Medicine | Admitting: Internal Medicine

## 2016-03-25 ENCOUNTER — Observation Stay (HOSPITAL_COMMUNITY): Payer: BLUE CROSS/BLUE SHIELD

## 2016-03-25 ENCOUNTER — Encounter (HOSPITAL_COMMUNITY): Payer: Self-pay

## 2016-03-25 DIAGNOSIS — R4781 Slurred speech: Secondary | ICD-10-CM | POA: Diagnosis not present

## 2016-03-25 DIAGNOSIS — F488 Other specified nonpsychotic mental disorders: Secondary | ICD-10-CM | POA: Diagnosis not present

## 2016-03-25 DIAGNOSIS — R29818 Other symptoms and signs involving the nervous system: Secondary | ICD-10-CM | POA: Diagnosis not present

## 2016-03-25 DIAGNOSIS — I7300 Raynaud's syndrome without gangrene: Secondary | ICD-10-CM | POA: Insufficient documentation

## 2016-03-25 DIAGNOSIS — R002 Palpitations: Secondary | ICD-10-CM | POA: Diagnosis not present

## 2016-03-25 DIAGNOSIS — Z86010 Personal history of colonic polyps: Secondary | ICD-10-CM | POA: Insufficient documentation

## 2016-03-25 DIAGNOSIS — I10 Essential (primary) hypertension: Secondary | ICD-10-CM | POA: Diagnosis not present

## 2016-03-25 DIAGNOSIS — E785 Hyperlipidemia, unspecified: Secondary | ICD-10-CM | POA: Diagnosis not present

## 2016-03-25 DIAGNOSIS — G459 Transient cerebral ischemic attack, unspecified: Secondary | ICD-10-CM | POA: Diagnosis not present

## 2016-03-25 DIAGNOSIS — F419 Anxiety disorder, unspecified: Secondary | ICD-10-CM | POA: Diagnosis not present

## 2016-03-25 DIAGNOSIS — D72829 Elevated white blood cell count, unspecified: Secondary | ICD-10-CM

## 2016-03-25 DIAGNOSIS — F1290 Cannabis use, unspecified, uncomplicated: Secondary | ICD-10-CM | POA: Diagnosis not present

## 2016-03-25 DIAGNOSIS — R29810 Facial weakness: Secondary | ICD-10-CM | POA: Diagnosis not present

## 2016-03-25 DIAGNOSIS — E876 Hypokalemia: Secondary | ICD-10-CM | POA: Insufficient documentation

## 2016-03-25 DIAGNOSIS — R2990 Unspecified symptoms and signs involving the nervous system: Secondary | ICD-10-CM

## 2016-03-25 DIAGNOSIS — Z87891 Personal history of nicotine dependence: Secondary | ICD-10-CM | POA: Diagnosis not present

## 2016-03-25 DIAGNOSIS — I700 Atherosclerosis of aorta: Secondary | ICD-10-CM | POA: Diagnosis not present

## 2016-03-25 DIAGNOSIS — E222 Syndrome of inappropriate secretion of antidiuretic hormone: Secondary | ICD-10-CM | POA: Diagnosis present

## 2016-03-25 DIAGNOSIS — I6789 Other cerebrovascular disease: Secondary | ICD-10-CM | POA: Diagnosis not present

## 2016-03-25 DIAGNOSIS — R4701 Aphasia: Secondary | ICD-10-CM

## 2016-03-25 DIAGNOSIS — R000 Tachycardia, unspecified: Secondary | ICD-10-CM | POA: Diagnosis not present

## 2016-03-25 DIAGNOSIS — I341 Nonrheumatic mitral (valve) prolapse: Secondary | ICD-10-CM | POA: Insufficient documentation

## 2016-03-25 DIAGNOSIS — R51 Headache: Secondary | ICD-10-CM | POA: Diagnosis not present

## 2016-03-25 DIAGNOSIS — R918 Other nonspecific abnormal finding of lung field: Secondary | ICD-10-CM | POA: Insufficient documentation

## 2016-03-25 DIAGNOSIS — R55 Syncope and collapse: Secondary | ICD-10-CM | POA: Diagnosis not present

## 2016-03-25 DIAGNOSIS — J449 Chronic obstructive pulmonary disease, unspecified: Secondary | ICD-10-CM | POA: Insufficient documentation

## 2016-03-25 DIAGNOSIS — I639 Cerebral infarction, unspecified: Secondary | ICD-10-CM | POA: Diagnosis not present

## 2016-03-25 DIAGNOSIS — Z888 Allergy status to other drugs, medicaments and biological substances status: Secondary | ICD-10-CM | POA: Insufficient documentation

## 2016-03-25 DIAGNOSIS — M8580 Other specified disorders of bone density and structure, unspecified site: Secondary | ICD-10-CM | POA: Diagnosis not present

## 2016-03-25 DIAGNOSIS — R531 Weakness: Secondary | ICD-10-CM | POA: Diagnosis not present

## 2016-03-25 DIAGNOSIS — Z8249 Family history of ischemic heart disease and other diseases of the circulatory system: Secondary | ICD-10-CM | POA: Insufficient documentation

## 2016-03-25 DIAGNOSIS — R739 Hyperglycemia, unspecified: Secondary | ICD-10-CM | POA: Diagnosis not present

## 2016-03-25 DIAGNOSIS — Z808 Family history of malignant neoplasm of other organs or systems: Secondary | ICD-10-CM | POA: Insufficient documentation

## 2016-03-25 DIAGNOSIS — Z833 Family history of diabetes mellitus: Secondary | ICD-10-CM | POA: Insufficient documentation

## 2016-03-25 DIAGNOSIS — M6281 Muscle weakness (generalized): Secondary | ICD-10-CM | POA: Diagnosis not present

## 2016-03-25 LAB — I-STAT CHEM 8, ED
BUN: 9 mg/dL (ref 6–20)
CALCIUM ION: 1.17 mmol/L (ref 1.15–1.40)
Chloride: 97 mmol/L — ABNORMAL LOW (ref 101–111)
Creatinine, Ser: 0.7 mg/dL (ref 0.44–1.00)
Glucose, Bld: 251 mg/dL — ABNORMAL HIGH (ref 65–99)
HCT: 43 % (ref 36.0–46.0)
HEMOGLOBIN: 14.6 g/dL (ref 12.0–15.0)
Potassium: 3.4 mmol/L — ABNORMAL LOW (ref 3.5–5.1)
SODIUM: 130 mmol/L — AB (ref 135–145)
TCO2: 21 mmol/L (ref 0–100)

## 2016-03-25 LAB — COMPREHENSIVE METABOLIC PANEL
ALK PHOS: 52 U/L (ref 38–126)
ALT: 13 U/L — ABNORMAL LOW (ref 14–54)
ANION GAP: 14 (ref 5–15)
AST: 28 U/L (ref 15–41)
Albumin: 3.9 g/dL (ref 3.5–5.0)
BUN: 8 mg/dL (ref 6–20)
CALCIUM: 10.1 mg/dL (ref 8.9–10.3)
CO2: 19 mmol/L — AB (ref 22–32)
Chloride: 96 mmol/L — ABNORMAL LOW (ref 101–111)
Creatinine, Ser: 0.8 mg/dL (ref 0.44–1.00)
GFR calc non Af Amer: >60 mL/min (ref >60)
Glucose, Bld: 239 mg/dL — ABNORMAL HIGH (ref 65–99)
Potassium: 3.3 mmol/L — ABNORMAL LOW (ref 3.5–5.1)
SODIUM: 129 mmol/L — AB (ref 135–145)
Total Bilirubin: 0.6 mg/dL (ref 0.3–1.2)
Total Protein: 6.9 g/dL (ref 6.5–8.1)

## 2016-03-25 LAB — CBC
HCT: 39.9 % (ref 36.0–46.0)
Hemoglobin: 13.6 g/dL (ref 12.0–15.0)
MCH: 33.9 pg (ref 26.0–34.0)
MCHC: 34.1 g/dL (ref 30.0–36.0)
MCV: 99.5 fL (ref 78.0–100.0)
PLATELETS: 253 10*3/uL (ref 150–400)
RBC: 4.01 MIL/uL (ref 3.87–5.11)
RDW: 12.9 % (ref 11.5–15.5)
WBC: 14.3 10*3/uL — ABNORMAL HIGH (ref 4.0–10.5)

## 2016-03-25 LAB — URINALYSIS, ROUTINE W REFLEX MICROSCOPIC
Bilirubin Urine: NEGATIVE
GLUCOSE, UA: 250 mg/dL — AB
HGB URINE DIPSTICK: NEGATIVE
KETONES UR: NEGATIVE mg/dL
LEUKOCYTES UA: NEGATIVE
NITRITE: NEGATIVE
PH: 6.5 (ref 5.0–8.0)
PROTEIN: 100 mg/dL — AB
SPECIFIC GRAVITY, URINE: 1.024 (ref 1.005–1.030)

## 2016-03-25 LAB — BRAIN NATRIURETIC PEPTIDE: B NATRIURETIC PEPTIDE 5: 112.5 pg/mL — AB (ref 0.0–100.0)

## 2016-03-25 LAB — DIFFERENTIAL
BASOS PCT: 0 %
Basophils Absolute: 0 10*3/uL (ref 0.0–0.1)
EOS PCT: 1 %
Eosinophils Absolute: 0.2 10*3/uL (ref 0.0–0.7)
LYMPHS PCT: 34 %
Lymphs Abs: 4.8 10*3/uL — ABNORMAL HIGH (ref 0.7–4.0)
MONO ABS: 1.4 10*3/uL — AB (ref 0.1–1.0)
Monocytes Relative: 9 %
Neutro Abs: 8 10*3/uL — ABNORMAL HIGH (ref 1.7–7.7)
Neutrophils Relative %: 56 %

## 2016-03-25 LAB — I-STAT TROPONIN, ED: TROPONIN I, POC: 0.01 ng/mL (ref 0.00–0.08)

## 2016-03-25 LAB — URINE MICROSCOPIC-ADD ON

## 2016-03-25 LAB — PROTIME-INR
INR: 1.09
PROTHROMBIN TIME: 14.1 s (ref 11.4–15.2)

## 2016-03-25 LAB — GLUCOSE, CAPILLARY: Glucose-Capillary: 182 mg/dL — ABNORMAL HIGH (ref 65–99)

## 2016-03-25 LAB — RAPID URINE DRUG SCREEN, HOSP PERFORMED
AMPHETAMINES: NOT DETECTED
Barbiturates: NOT DETECTED
Benzodiazepines: NOT DETECTED
Cocaine: NOT DETECTED
OPIATES: NOT DETECTED
Tetrahydrocannabinol: POSITIVE — AB

## 2016-03-25 LAB — TSH: TSH: 1.824 u[IU]/mL (ref 0.350–4.500)

## 2016-03-25 LAB — PHOSPHORUS: Phosphorus: 2.1 mg/dL — ABNORMAL LOW (ref 2.5–4.6)

## 2016-03-25 LAB — MAGNESIUM: Magnesium: 1.8 mg/dL (ref 1.7–2.4)

## 2016-03-25 LAB — APTT: aPTT: 25 seconds (ref 24–36)

## 2016-03-25 LAB — ETHANOL

## 2016-03-25 MED ORDER — ASPIRIN 325 MG PO TABS
325.0000 mg | ORAL_TABLET | Freq: Every day | ORAL | Status: DC
  Administered 2016-03-26 – 2016-03-27 (×2): 325 mg via ORAL
  Filled 2016-03-25 (×2): qty 1

## 2016-03-25 MED ORDER — HEPARIN SODIUM (PORCINE) 5000 UNIT/ML IJ SOLN: 5000.0000 [IU] | Freq: Three times a day (TID) | INTRAMUSCULAR | Status: DC

## 2016-03-25 MED ORDER — SODIUM CHLORIDE 0.9 % IV SOLN
INTRAVENOUS | Status: DC
Start: 2016-03-25 — End: 2016-03-26
  Administered 2016-03-25 – 2016-03-26 (×2): via INTRAVENOUS

## 2016-03-25 MED ORDER — SODIUM CHLORIDE 0.9% FLUSH
3.0000 mL | Freq: Two times a day (BID) | INTRAVENOUS | Status: DC
  Administered 2016-03-25: 3 mL via INTRAVENOUS

## 2016-03-25 MED ORDER — ACETAMINOPHEN 325 MG PO TABS: 650.0000 mg | ORAL_TABLET | Freq: Four times a day (QID) | ORAL | Status: DC | PRN

## 2016-03-25 MED ORDER — ASPIRIN 325 MG PO TABS
325.0000 mg | ORAL_TABLET | Freq: Once | ORAL | Status: AC
  Administered 2016-03-25: 325 mg via ORAL
  Filled 2016-03-25: qty 1

## 2016-03-25 MED ORDER — ASPIRIN 300 MG RE SUPP: 300.0000 mg | Freq: Every day | RECTAL | Status: DC

## 2016-03-25 MED ORDER — LATANOPROST 0.005 % OP SOLN
1.0000 [drp] | Freq: Every day | OPHTHALMIC | Status: DC
  Administered 2016-03-25 – 2016-03-26 (×2): 1 [drp] via OPHTHALMIC
  Filled 2016-03-25: qty 2.5

## 2016-03-25 MED ORDER — AMLODIPINE BESYLATE 2.5 MG PO TABS
2.5000 mg | ORAL_TABLET | Freq: Two times a day (BID) | ORAL | Status: DC
  Administered 2016-03-25 – 2016-03-26 (×2): 2.5 mg via ORAL
  Filled 2016-03-25 (×2): qty 1

## 2016-03-25 MED ORDER — ENOXAPARIN SODIUM 40 MG/0.4ML ~~LOC~~ SOLN
40.0000 mg | SUBCUTANEOUS | Status: DC
  Administered 2016-03-25 – 2016-03-26 (×2): 40 mg via SUBCUTANEOUS
  Filled 2016-03-25 (×3): qty 0.4

## 2016-03-25 MED ORDER — SODIUM CHLORIDE 0.9 % IV BOLUS (SEPSIS)
1000.0000 mL | Freq: Once | INTRAVENOUS | Status: AC
Start: 2016-03-25 — End: 2016-03-25
  Administered 2016-03-25: 1000 mL via INTRAVENOUS

## 2016-03-25 MED ORDER — ACETAMINOPHEN 650 MG RE SUPP: 650.0000 mg | Freq: Four times a day (QID) | RECTAL | Status: DC | PRN

## 2016-03-25 MED ORDER — IOPAMIDOL (ISOVUE-370) INJECTION 76%
INTRAVENOUS | Status: AC
  Administered 2016-03-25: 50 mL
  Filled 2016-03-25: qty 100

## 2016-03-25 MED ORDER — STROKE: EARLY STAGES OF RECOVERY BOOK
Freq: Once | Status: DC
  Filled 2016-03-25: qty 1

## 2016-03-25 MED ORDER — ONDANSETRON HCL 4 MG PO TABS: 4.0000 mg | ORAL_TABLET | Freq: Four times a day (QID) | ORAL | Status: DC | PRN

## 2016-03-25 MED ORDER — ONDANSETRON HCL 4 MG/2ML IJ SOLN
4.0000 mg | Freq: Four times a day (QID) | INTRAMUSCULAR | Status: DC | PRN
  Administered 2016-03-26 (×2): 4 mg via INTRAVENOUS
  Filled 2016-03-25 (×2): qty 2

## 2016-03-25 NOTE — Consult Note (Signed)
CARDIOLOGY CONSULT NOTE   Patient ID: Candice Gray MRN: [id number] DOB/AGE: 1956/02/25 60 y.o.  Admit date: 03/25/2016  Primary Physician   Eliezer Lofts, MD Primary Cardiologist   Dr. Debara Pickett Reason for Consultation  Abnormal EKG Requesting Physician  Dr. Darl Householder  HPI: Candice Gray is a 60 y.o. female with a history of Labile hypertension, Raynaud's phenomena, hyperlipidemia, SIADH, remote history of mitral wall prolapse with associated palpitation who presented for evaluation of acute onset syncope and slurred speech 03/25/16.  She has been followed by Dr. Debara Pickett for labile hypertension which felt secondary to stress, anxiety and autonomic stimuli. Last seen 06/22/15 and started on daily doses of amlodipine instead of when necessary dose and advised to follow-up as needed. Previously seen by Dr. Rex Kras for mitral valve prolapse with associated palpitation.  The patient states that she has a long-standing history of palpitation, "mostly fast heart rate with intermittent skipping beats ". Never placed on monitor. Her palpitation occurs sometimes multiple times in a day and sometimes once a week. Last for few minutes and resolved by itself. No prior history of syncope.  She was in usual state of health up until this morning while standing in a kitchen she suddenly felt heart racing followed by lightheadedness. She sat down and then passed out. Witnessed by son and husband. She did felt rigidity of the extremities and slurred speech with right-sided facial droop prior to syncope. She was out for approximately 10-15 minutes. By the time she came to ER her symptoms was resolved. However has mild confusion regarding what happened. CT of head showed Question acute left subinsular infarct/middle cerebral artery distribution infarct. CT angiogram inconclusive. Patient is evaluated by neurology. No TPA given.  EKG shows sinus rhythm at a rate of 117 bpm. Compared to prior EKG rate is fast. Potassium 3.4.  Glucose 239. Point-of-care troponin negative. WBC 14.3.  Past Medical History:  Diagnosis Date  . Anxiety   . Deviated nasal septum   . Diverticulitis   . Diverticulosis   . HTN (hypertension)   . Hyperlipemia   . Hyperplastic colon polyp   . Hyponatremia   . Hyponatremia   . MVP (mitral valve prolapse)   . Raynaud phenomenon   . SIADH (syndrome of inappropriate ADH production) ([place])      Past Surgical History:  Procedure Laterality Date  . ABDOMINAL HYSTERECTOMY    . CRYOTHERAPY     for cervical dysplasia  . NM MYOCAR PERF WALL MOTION  11/2007   bruce myoview; perfusion defect in anterior myocardium (breast attenuation); post-stress EF 77%; normal, low risk study   . TRANSTHORACIC ECHOCARDIOGRAM  11/2007   EF=>55%; mild MR; trace TR    Allergies  Allergen Reactions  . Azelastine Hcl     REACTION: increased bp  . Miralax [Polyethylene Glycol] Itching  . Red Dye Swelling  . Zyban [Bupropion] Swelling    I have reviewed the patient's current medications     Prior to Admission medications   Medication Sig Start Date End Date Taking? Authorizing Provider  amLODipine (NORVASC) 2.5 MG tablet Take 1 tablet (2.5 mg total) by mouth 2 (two) times daily. 12/02/15  Yes Pixie Casino, MD  ibandronate (BONIVA) 150 MG tablet Take 150 mg by mouth every 30 (thirty) days. Take in the morning with a full glass of water, on an empty stomach, and do not take anything else by mouth or lie down for the next 30 min.   Yes Historical Provider, MD  latanoprost (XALATAN) 0.005 % ophthalmic solution Place 1 drop into both eyes at bedtime.   Yes Historical Provider, MD  Biotin 1 MG CAPS Take 1 tablet by mouth daily.    Historical Provider, MD  Calcium-Vitamin D (CALTRATE 600 PLUS-VIT D PO) Take 1 tablet by mouth daily.    Historical Provider, MD  Cholecalciferol (VITAMIN D-3 PO) Take 1 capsule by mouth daily.    Historical Provider, MD  triamcinolone cream (KENALOG) 0.5 % Apply 1 application  topically 2 (two) times daily. Patient not taking: Reported on 03/25/2016 08/05/15   Jinny Sanders, MD  vitamin C (ASCORBIC ACID) 500 MG tablet Take 500 mg by mouth daily.    Historical Provider, MD     Social History   Social History  . Marital status: Married    Spouse name: N/A  . Number of children: 2  . Years of education: N/A   Occupational History  . 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 Smoking status: Former Smoker    Types: Cigarettes    Quit date: 06/05/1998  . Smokeless tobacco: Never Used  . Alcohol use 8.4 oz/week    14 Cans of beer per week     Comment: daily  . Drug use:     Types: Marijuana     Comment: remote  . Sexual activity: Not on file   Other Topics Concern  . Not on file   Social History Narrative   Regular exercise--yes      Diet: fruits and veggies    Family Status  Relation Status  . Father   . Paternal Grandmother   . Paternal Grandfather   . Other   . Maternal Grandmother   . Brother   . Neg Hx    Family History  Problem Relation Age of Onset  . Hyperlipidemia Father   . Hypertension Father   . Diabetes Father   . Diabetes Paternal Grandmother   . Cancer Paternal Grandfather     brain tumor  . Hyperlipidemia Other   . Hypertension Other   . Coronary artery disease Maternal Grandmother   . Hyperlipidemia Brother   . Colon cancer Neg Hx      ROS:  Full 14 point review of systems complete and found to be negative unless listed above.  Physical Exam: Blood pressure 142/80, pulse (!) 122, temperature 97.8 F (36.6 C), temperature source Oral, resp. rate 23, height 5\' 8"  (1.727 m), weight 124 lb (56.2 kg), SpO2 100 %.  General: Well developed, well nourished, female in no acute distress Head: Eyes PERRLA, No xanthomas. Normocephalic and atraumatic, oropharynx without edema or exudate.  Lungs: Resp regular and unlabored, CTA. Heart: RRR no s3, s4, or murmurs..   Neck: No carotid  bruits. No lymphadenopathy. No JVD. Abdomen: Bowel sounds present, abdomen soft and non-tender without masses or hernias noted. Msk:  No spine or cva tenderness. No weakness, no joint deformities or effusions. Extremities: No clubbing, cyanosis or edema. DP/PT/Radials 2+ and equal bilaterally. Neuro: Alert and oriented X 3. No focal deficits noted. Psych:  Good affect, responds appropriately Skin: No rashes or lesions noted.  Labs:   Lab Results  Component Value Date   WBC 14.3 (H) 03/25/2016   HGB 14.6 03/25/2016   HCT 43.0 03/25/2016   MCV 99.5 03/25/2016   PLT 253 03/25/2016    Recent Labs  03/25/16 1039  INR 1.09    Recent Labs Lab 03/25/16 1039 03/25/16  1041  NA 129* 130*  K 3.3* 3.4*  CL 96* 97*  CO2 19*  --   BUN 8 9  CREATININE 0.80 0.70  CALCIUM 10.1  --   PROT 6.9  --   BILITOT 0.6  --   ALKPHOS 52  --   ALT 13*  --   AST 28  --   GLUCOSE 239* 251*  ALBUMIN 3.9  --    Magnesium  Date Value Ref Range Status  08/11/2013 1.8 1.5 - 2.5 mg/dL Final   No results for input(s): CKTOTAL, CKMB, TROPONINI in the last 72 hours.  Recent Labs  03/25/16 1040  TROPIPOC 0.01   No results found for: PROBNP Lab Results  Component Value Date   CHOL 249 (H) 03/05/2016   HDL 144.10 03/05/2016   LDLCALC 94 03/05/2016   TRIG 52.0 03/05/2016    Echo: Pending  Radiology:  Ct Angio Head W Or Wo Contrast  Result Date: 03/25/2016 CLINICAL DATA:  Code stroke. Right-sided weakness and slurred speech. EXAM: CT ANGIOGRAPHY HEAD AND NECK TECHNIQUE: Multidetector CT imaging of the head and neck was performed using the standard protocol during bolus administration of intravenous contrast. Multiplanar CT image reconstructions and MIPs were obtained to evaluate the vascular anatomy. Carotid stenosis measurements (when applicable) are obtained utilizing NASCET criteria, using the distal internal carotid diameter as the denominator. CONTRAST:  50 cc Isovue 370 COMPARISON:  Head  CT same day. FINDINGS: CTA NECK FINDINGS Aortic arch: Some atherosclerosis of the aortic arch. No dissection. No brachiocephalic vessel origin stenosis. Right carotid system: Common carotid artery widely patent to the bifurcation. Atherosclerosis at the carotid bifurcation without stenosis or significant irregularity. Cervical internal carotid artery is normal. Left carotid system: Common carotid artery widely patent to the bifurcation. Atherosclerotic disease affecting the ICA bulb with some regularity but no stenosis compared to the more distal cervical ICA. Vertebral arteries: Both vertebral artery origins widely patent. Both vertebral arteries widely patent through the cervical region. Skeleton: Negative Other neck: Negative Upper chest: Biapical pleural and parenchymal scarring. Review of the MIP images confirms the above findings CTA HEAD FINDINGS Anterior circulation: Both internal carotid artery is widely patent through the skullbase and siphon regions. The anterior and middle cerebral vessels are patent without proximal stenosis, aneurysm or vascular malformation. Fetal origin PCAs. Posterior circulation: Both vertebral arteries widely patent to the basilar. No basilar stenosis. Posterior circulation branch vessels are normal. Venous sinuses: Patent and normal. Anatomic variants: None significant Delayed phase: No abnormal enhancement Review of the MIP images confirms the above findings IMPRESSION: Some atherosclerotic disease at both carotid bifurcations. Mild irregularity of the proximal ICA on the left, but no measurable stenosis. Two Intracranial vessels are negative.  No large vessel occlusion. These results were called by telephone at the time of interpretation on 03/25/2016 at 11:08 am to Dr. Norman Clay , who verbally acknowledged these results. Electronically Signed   By: Nelson Chimes M.D.   On: 03/25/2016 11:10   Ct Angio Neck W Or Wo Contrast  Result Date: 03/25/2016 CLINICAL DATA:   Code stroke. Right-sided weakness and slurred speech. EXAM: CT ANGIOGRAPHY HEAD AND NECK TECHNIQUE: Multidetector CT imaging of the head and neck was performed using the standard protocol during bolus administration of intravenous contrast. Multiplanar CT image reconstructions and MIPs were obtained to evaluate the vascular anatomy. Carotid stenosis measurements (when applicable) are obtained utilizing NASCET criteria, using the distal internal carotid diameter as the denominator. CONTRAST:  50 cc Isovue 370  COMPARISON:  Head CT same day. FINDINGS: CTA NECK FINDINGS Aortic arch: Some atherosclerosis of the aortic arch. No dissection. No brachiocephalic vessel origin stenosis. Right carotid system: Common carotid artery widely patent to the bifurcation. Atherosclerosis at the carotid bifurcation without stenosis or significant irregularity. Cervical internal carotid artery is normal. Left carotid system: Common carotid artery widely patent to the bifurcation. Atherosclerotic disease affecting the ICA bulb with some regularity but no stenosis compared to the more distal cervical ICA. Vertebral arteries: Both vertebral artery origins widely patent. Both vertebral arteries widely patent through the cervical region. Skeleton: Negative Other neck: Negative Upper chest: Biapical pleural and parenchymal scarring. Review of the MIP images confirms the above findings CTA HEAD FINDINGS Anterior circulation: Both internal carotid artery is widely patent through the skullbase and siphon regions. The anterior and middle cerebral vessels are patent without proximal stenosis, aneurysm or vascular malformation. Fetal origin PCAs. Posterior circulation: Both vertebral arteries widely patent to the basilar. No basilar stenosis. Posterior circulation branch vessels are normal. Venous sinuses: Patent and normal. Anatomic variants: None significant Delayed phase: No abnormal enhancement Review of the MIP images confirms the above  findings IMPRESSION: Some atherosclerotic disease at both carotid bifurcations. Mild irregularity of the proximal ICA on the left, but no measurable stenosis. Two Intracranial vessels are negative.  No large vessel occlusion. These results were called by telephone at the time of interpretation on 03/25/2016 at 11:08 am to Dr. Norman Clay , who verbally acknowledged these results. Electronically Signed   By: Nelson Chimes M.D.   On: 03/25/2016 11:10   Ct Head Code Stroke W/o Cm  Result Date: 03/25/2016 CLINICAL DATA:  Code stroke. 60 year old female with slurred speech and right-sided weakness. Initial encounter. EXAM: CT HEAD WITHOUT CONTRAST TECHNIQUE: Contiguous axial images were obtained from the base of the skull through the vertex without intravenous contrast. COMPARISON:  08/11/2013. FINDINGS: Brain: Left subinsular subtle hypodensity. Question evidence of acute infarct. No intracranial hemorrhage. Global atrophy without hydrocephalus. No intracranial mass lesion noted on this unenhanced exam. Vascular: Question subtle hyperdensity left middle cerebral artery as can be seen with thrombus. Vascular calcifications. Skull: No acute abnormality. Sinuses/Orbits: No acute orbital abnormality. Visualized sinuses and mastoid air cells are clear. Other: Negative. ASPECTS [facility] Stroke Program Early CT Score) - Ganglionic level infarction (caudate, lentiform nuclei, internal capsule, insula, M1-M3 cortex): 6 - Supraganglionic infarction (M4-M6 cortex): 3 Total score (0-10 with 10 being normal): 9 IMPRESSION: 1. Question acute left subinsular infarct/middle cerebral artery distribution infarct. Question subtle hyperdensity left middle cerebral artery as can be seen with thrombus. Global atrophy. 2. ASPECTS is 9 These results were called by telephone at the time of interpretation on 03/25/2016 at 10:55 am to Dr. Shirlyn Goltz , who verbally acknowledged these results. Electronically Signed   By: Genia Del M.D.    On: 03/25/2016 10:57    ASSESSMENT AND PLAN:      1. Syncope with questionable stroke/TIA/seizure - Undergoing workup per neurology. Cannot rule out cardiac etiology given history of palpitation/tachycardia and mitral valve prolapse. Will need echocardiogram to further evaluate her mitral valve prolapse. No murmur heard. Telemetry so far shows sinus tachycardia at rate of 110-120 beats per minutes. Continue to monitor on telemetry. Likely need monitor at discharge versus loop recorder placement depending on findings. Get TSH.   2. Hypertension - Will defer to primary for possible permissive hypertension. Add  beta blocker to slow down heart rate once stable blood pressure.  3. HLD - 03/05/2016:  Cholesterol 249; HDL 144.10; LDL Cholesterol 94; Triglycerides 52.0; VLDL 10.4  - Consider adding statin.  4. Elevated blood sugar - Pending A1C  Signed: Nyara Capell, PA 03/25/2016, 11:43 AM Pager [phone]  Co-Sign MD

## 2016-03-25 NOTE — Consult Note (Signed)
Requesting Physician: Dr. Darl Householder    Chief Complaint: code stroke  History obtained from:  Patient aND husband  HPI:                                                                                                                                         Candice Gray is an 60 y.o. female with hx as below was found by her son unresponsive at the table this am, upon awakening she was mute and weaker on the right side. By the time she came to the ER she was speaking, moving all extremities equally with mild confusion to what happened.  Date last known well: 03/25/16 Time last known well:  945am   Past Medical History:  Diagnosis Date  . Anxiety   . Deviated nasal septum   . Diverticulitis   . Diverticulosis   . HTN (hypertension)   . Hyperlipemia   . Hyperplastic colon polyp   . Hyponatremia   . Hyponatremia   . MVP (mitral valve prolapse)   . Raynaud phenomenon   . SIADH (syndrome of inappropriate ADH production) ([place])     Past Surgical History:  Procedure Laterality Date  . ABDOMINAL HYSTERECTOMY    . CRYOTHERAPY     for cervical dysplasia  . NM MYOCAR PERF WALL MOTION  11/2007   bruce myoview; perfusion defect in anterior myocardium (breast attenuation); post-stress EF 77%; normal, low risk study   . TRANSTHORACIC ECHOCARDIOGRAM  11/2007   EF=>55%; mild MR; trace TR    Family History  Problem Relation Age of Onset  . Hyperlipidemia Father   . Hypertension Father   . Diabetes Father   . Diabetes Paternal Grandmother   . Cancer Paternal Grandfather     brain tumor  . Hyperlipidemia Other   . Hypertension Other   . Coronary artery disease Maternal Grandmother   . Hyperlipidemia Brother   . Colon cancer Neg Hx    Social History:  reports that she quit smoking about 17 years ago. Her smoking use included Cigarettes. She has never used smokeless tobacco. She reports that she drinks about 8.4 oz of alcohol per week . She reports that she uses drugs, including  Marijuana.  Allergies:  Allergies  Allergen Reactions  . Azelastine Hcl     REACTION: increased bp  . Miralax [Polyethylene Glycol] Itching  . Red Dye Swelling  . Zyban [Bupropion] Swelling    Medications:  I have reviewed the patient's current medications.  ROS:                                                                                                                                       History obtained from chart review  General ROS: negative for - chills, fatigue, fever, night sweats, weight gain or weight loss Psychological ROS: negative for - behavioral disorder, hallucinations, memory difficulties, mood swings or suicidal ideation Ophthalmic ROS: negative for - blurry vision, double vision, eye pain or loss of vision ENT ROS: negative for - epistaxis, nasal discharge, oral lesions, sore throat, tinnitus or vertigo Allergy and Immunology ROS: negative for - hives or itchy/watery eyes Hematological and Lymphatic ROS: negative for - bleeding problems, bruising or swollen lymph nodes Endocrine ROS: negative for - galactorrhea, hair pattern changes, polydipsia/polyuria or temperature intolerance Respiratory ROS: negative for - cough, hemoptysis, shortness of breath or wheezing Cardiovascular ROS: negative for - chest pain, dyspnea on exertion, edema or irregular heartbeat Gastrointestinal ROS: negative for - abdominal pain, diarrhea, hematemesis, nausea/vomiting or stool incontinence Genito-Urinary ROS: negative for - dysuria, hematuria, incontinence or urinary frequency/urgency Musculoskeletal ROS: negative for - joint swelling or muscular weakness Neurological ROS: as noted in HPI Dermatological ROS: negative for rash and skin lesion changes  Neurologic Examination:                                                                                                       Height 5\' 8"  (1.727 m), weight 56.2 kg (124 lb).  HEENT-  Normocephalic, no lesions, without obvious abnormality.  Normal external eye and conjunctiva.  Normal TM's bilaterally.  Normal auditory canals and external ears. Normal external nose, mucus membranes and septum.  Normal pharynx. Cardiovascular- regular rate and rhythm, S1, S2 normal, no murmur, click, rub or gallop, pulses palpable throughout   Lungs- chest clear, no wheezing, rales, normal symmetric air entry, Heart exam - S1, S2 normal, no murmur, no gallop, rate regular Abdomen- soft, non-tender; bowel sounds normal; no masses,  no organomegaly   Neurological Examination Mental Status: Alert, oriented, thought content appropriate.  Speech fluent without evidence of aphasia.  Able to follow 3 step commands without difficulty. Cranial Nerves: [id number] fields grossly normal, pupils equal, round, reactive to light and accommodation III,IV, VI: ptosis not present, extra-ocular motions intact bilaterally V,VII: smile symmetric, facial light touch sensation normal bilaterally VIII: hearing normal bilaterally IX,X: uvula rises symmetrically XI: bilateral shoulder shrug XII: midline tongue extension  Motor: Right : Upper extremity   5/5 Mild diminished fine motor movement in her R hand   Left:     Upper extremity   5/5  Lower extremity   5/5     Lower extremity   5/5 Tone and bulk:normal tone throughout; no atrophy noted Sensory: Pinprick and light touch intact throughout, bilaterally  Cerebellar: normal finger-to-nose  NIHS 0        Lab Results: Basic Metabolic Panel:  Recent Labs Lab 03/25/16 1039 03/25/16 1041  NA 129* 130*  K 3.3* 3.4*  CL 96* 97*  CO2 19*  --   GLUCOSE 239* 251*  BUN 8 9  CREATININE 0.80 0.70  CALCIUM 10.1  --     Liver Function Tests:  Recent Labs Lab 03/25/16 1039  AST 28  ALT 13*  ALKPHOS 52  BILITOT 0.6  PROT 6.9  ALBUMIN 3.9   No results for input(s): LIPASE,  AMYLASE in the last 168 hours. No results for input(s): AMMONIA in the last 168 hours.  CBC:  Recent Labs Lab 03/25/16 1039 03/25/16 1041  WBC 14.3*  --   NEUTROABS 8.0*  --   HGB 13.6 14.6  HCT 39.9 43.0  MCV 99.5  --   PLT 253  --     Cardiac Enzymes: No results for input(s): CKTOTAL, CKMB, CKMBINDEX, TROPONINI in the last 168 hours.  Lipid Panel: No results for input(s): CHOL, TRIG, HDL, CHOLHDL, VLDL, LDLCALC in the last 168 hours.  CBG: No results for input(s): GLUCAP in the last 168 hours.  Microbiology: Results for orders placed or performed during the hospital encounter of 08/11/13  Urine culture     Status: None   Collection Time: 08/11/13  1:35 PM  Result Value Ref Range Status   Specimen Description URINE, CLEAN CATCH  Final   Special Requests NONE  Final   Culture  Setup Time   Final    08/11/2013 17:23 Performed at [facility]   Final    65,000 COLONIES/ML Performed at [facility]   Culture   Final    Multiple bacterial morphotypes present, none predominant. Suggest appropriate recollection if clinically indicated. Performed at [facility]   Report Status 08/12/2013 FINAL  Final  MRSA PCR Screening     Status: None   Collection Time: 08/11/13  4:07 PM  Result Value Ref Range Status   MRSA by PCR NEGATIVE NEGATIVE Final    Comment:        The GeneXpert MRSA Assay (FDA approved for NASAL specimens only), is one component of a comprehensive MRSA colonization surveillance program. It is not intended to diagnose MRSA infection nor to guide or monitor treatment for MRSA infections.    Coagulation Studies:  Recent Labs  03/25/16 1039  LABPROT 14.1  INR 1.09    Imaging: Ct Angio Head W Or Wo Contrast  Result Date: 03/25/2016 CLINICAL DATA:  Code stroke. Right-sided weakness and slurred speech. EXAM: CT ANGIOGRAPHY HEAD AND NECK TECHNIQUE: Multidetector CT imaging of the head and neck was performed  using the standard protocol during bolus administration of intravenous contrast. Multiplanar CT image reconstructions and MIPs were obtained to evaluate the vascular anatomy. Carotid stenosis measurements (when applicable) are obtained utilizing NASCET criteria, using the distal internal carotid diameter as the denominator. CONTRAST:  50 cc Isovue 370 COMPARISON:  Head CT same day. FINDINGS: CTA NECK FINDINGS Aortic arch: Some atherosclerosis of the aortic arch. No dissection. No brachiocephalic  vessel origin stenosis. Right carotid system: Common carotid artery widely patent to the bifurcation. Atherosclerosis at the carotid bifurcation without stenosis or significant irregularity. Cervical internal carotid artery is normal. Left carotid system: Common carotid artery widely patent to the bifurcation. Atherosclerotic disease affecting the ICA bulb with some regularity but no stenosis compared to the more distal cervical ICA. Vertebral arteries: Both vertebral artery origins widely patent. Both vertebral arteries widely patent through the cervical region. Skeleton: Negative Other neck: Negative Upper chest: Biapical pleural and parenchymal scarring. Review of the MIP images confirms the above findings CTA HEAD FINDINGS Anterior circulation: Both internal carotid artery is widely patent through the skullbase and siphon regions. The anterior and middle cerebral vessels are patent without proximal stenosis, aneurysm or vascular malformation. Fetal origin PCAs. Posterior circulation: Both vertebral arteries widely patent to the basilar. No basilar stenosis. Posterior circulation branch vessels are normal. Venous sinuses: Patent and normal. Anatomic variants: None significant Delayed phase: No abnormal enhancement Review of the MIP images confirms the above findings IMPRESSION: Some atherosclerotic disease at both carotid bifurcations. Mild irregularity of the proximal ICA on the left, but no measurable stenosis. Two  Intracranial vessels are negative.  No large vessel occlusion. These results were called by telephone at the time of interpretation on 03/25/2016 at 11:08 am to Dr. Norman Clay , who verbally acknowledged these results. Electronically Signed   By: Nelson Chimes M.D.   On: 03/25/2016 11:10   Ct Angio Neck W Or Wo Contrast  Result Date: 03/25/2016 CLINICAL DATA:  Code stroke. Right-sided weakness and slurred speech. EXAM: CT ANGIOGRAPHY HEAD AND NECK TECHNIQUE: Multidetector CT imaging of the head and neck was performed using the standard protocol during bolus administration of intravenous contrast. Multiplanar CT image reconstructions and MIPs were obtained to evaluate the vascular anatomy. Carotid stenosis measurements (when applicable) are obtained utilizing NASCET criteria, using the distal internal carotid diameter as the denominator. CONTRAST:  50 cc Isovue 370 COMPARISON:  Head CT same day. FINDINGS: CTA NECK FINDINGS Aortic arch: Some atherosclerosis of the aortic arch. No dissection. No brachiocephalic vessel origin stenosis. Right carotid system: Common carotid artery widely patent to the bifurcation. Atherosclerosis at the carotid bifurcation without stenosis or significant irregularity. Cervical internal carotid artery is normal. Left carotid system: Common carotid artery widely patent to the bifurcation. Atherosclerotic disease affecting the ICA bulb with some regularity but no stenosis compared to the more distal cervical ICA. Vertebral arteries: Both vertebral artery origins widely patent. Both vertebral arteries widely patent through the cervical region. Skeleton: Negative Other neck: Negative Upper chest: Biapical pleural and parenchymal scarring. Review of the MIP images confirms the above findings CTA HEAD FINDINGS Anterior circulation: Both internal carotid artery is widely patent through the skullbase and siphon regions. The anterior and middle cerebral vessels are patent without proximal  stenosis, aneurysm or vascular malformation. Fetal origin PCAs. Posterior circulation: Both vertebral arteries widely patent to the basilar. No basilar stenosis. Posterior circulation branch vessels are normal. Venous sinuses: Patent and normal. Anatomic variants: None significant Delayed phase: No abnormal enhancement Review of the MIP images confirms the above findings IMPRESSION: Some atherosclerotic disease at both carotid bifurcations. Mild irregularity of the proximal ICA on the left, but no measurable stenosis. Two Intracranial vessels are negative.  No large vessel occlusion. These results were called by telephone at the time of interpretation on 03/25/2016 at 11:08 am to Dr. Norman Clay , who verbally acknowledged these results. Electronically Signed   By: Nelson Chimes  M.D.   On: 03/25/2016 11:10   Ct Head Code Stroke W/o Cm  Result Date: 03/25/2016 CLINICAL DATA:  Code stroke. 60 year old female with slurred speech and right-sided weakness. Initial encounter. EXAM: CT HEAD WITHOUT CONTRAST TECHNIQUE: Contiguous axial images were obtained from the base of the skull through the vertex without intravenous contrast. COMPARISON:  08/11/2013. FINDINGS: Brain: Left subinsular subtle hypodensity. Question evidence of acute infarct. No intracranial hemorrhage. Global atrophy without hydrocephalus. No intracranial mass lesion noted on this unenhanced exam. Vascular: Question subtle hyperdensity left middle cerebral artery as can be seen with thrombus. Vascular calcifications. Skull: No acute abnormality. Sinuses/Orbits: No acute orbital abnormality. Visualized sinuses and mastoid air cells are clear. Other: Negative. ASPECTS [facility] Stroke Program Early CT Score) - Ganglionic level infarction (caudate, lentiform nuclei, internal capsule, insula, M1-M3 cortex): 6 - Supraganglionic infarction (M4-M6 cortex): 3 Total score (0-10 with 10 being normal): 9 IMPRESSION: 1. Question acute left subinsular  infarct/middle cerebral artery distribution infarct. Question subtle hyperdensity left middle cerebral artery as can be seen with thrombus. Global atrophy. 2. ASPECTS is 9 These results were called by telephone at the time of interpretation on 03/25/2016 at 10:55 am to Dr. Shirlyn Goltz , who verbally acknowledged these results. Electronically Signed   By: Genia Del M.D.   On: 03/25/2016 10:57         Assessment:  60 y.o. female with hx as below was found by her son unresponsive at the table this am, upon awakening she was mute and weaker on the right side. By the time she came to the ER she was speaking, moving all extremities equally with mild confusion to what happened.  CTH- showed L dense MCA- most likely artifact  CTA- all vessels are open  No TPA, NIHS 0  1. HgbA1c, fasting lipid panel 2. MRI, MRA  of the brain without contrast 3. PT consult, OT consult, Speech consult 4. Echocardiogram 5. Carotid dopplers 6. Prophylactic therapy-Antiplatelet med: Aspirin - dose 325 7. Risk factor modification 8. Telemetry monitoring 9. Frequent neuro checks 10 NPO until passes stroke swallow screen 11 please page stroke NP  Or  PA  Or MD from 8am -4 pm  as this patient from this time will be  followed by the stroke.   You can look them up on  [email or web address]  Password [email or web address]  - Please add Utox to UA - Routine EEG to make sure she didn't have a seizure    Stroke Risk Factors - hypertension

## 2016-03-25 NOTE — ED Notes (Signed)
MD Darl Householder Requested another EKG

## 2016-03-25 NOTE — Telephone Encounter (Signed)
Melissa with [facility] called to get update on meds pt taking. Pt is in [facility] ED now. Advised per 03/12/16 annual exam visit it was noted pt taking Amlodipine 2.5 mg bid and pt was to restart Boniva. Melissa voiced understanding. FYI to Dr Diona Browner.

## 2016-03-25 NOTE — Code Documentation (Addendum)
60yo female arriving to [facility] via [place] at 1033.  Patient from home where she was at the breakfast table and became unresponsive at 0945 according to family.  The first medic on scene assessed patient to have pinpoint pupils and patient was given intranasal Narcan without effect.  Patient with right facial droop, right side flaccid and unable to speak at that time.  [facility] arrived and symptoms had resolved.  Patient with confusion and agitation on EMS exam.  Code stroke activated.  Stroke team at the bedside on patient arrival.  Labs drawn and patient cleared for CT by Dr. Darl Householder.  Patient to CT with team.  CT completed.  CTA completed and negative for LVO per MD.  NIHSS 1, see documentation for details and code stroke times.  Patient unable to state her age on exam.  No acute stroke treatment d/t symptoms resolving.  Bedside handoff with ED RN Cloyde Reams.

## 2016-03-25 NOTE — ED Notes (Signed)
Transfer note: Code stroke  Pt was found down at the breakfast table this AM by her son. They reported a right sided facial droop and weakness with memory loss that had resolved when she arrived.  18G in R AC, 20 L forearm. No O2 1L of fluid Can ambulate to bathroom (continent) and eat Passed swallow screen  Husband at bedside A/Ox4

## 2016-03-25 NOTE — ED Triage Notes (Signed)
Patient was eating breakfast and her son found her passed out. When she came to, she wasn't really speaking and couldn't remember what happened. There was right sided facial droop and some slurred speech reported by family. Resolved upon arrival

## 2016-03-25 NOTE — Progress Notes (Signed)
Pt arrived to 5C15 via stretcher, alert and oriented.  No complaints of pain.  Telemetry applied and CCMD notified.  Will continue to monitor.  Cori Razor, RN

## 2016-03-25 NOTE — ED Provider Notes (Signed)
[facility] DEPT Provider Note   CSN: [id number] Arrival date & time: 03/25/16  1033   An emergency department physician performed an initial assessment on this suspected stroke patient at [address] [facility]).  History   Chief Complaint Chief Complaint  Patient presents with  . Code Stroke    HPI Candice Gray is a 60 y.o. female hx of HTN, diverticulitis, HTN, HL, here with possible syncope vs stroke. Patient Was eating breakfast this morning and her husband went outside and then found her on the floor. They did not know what has happened. EMS was called and found her altered with some slurred speech. Also noted to have pinpoint pupils so narcan was given. When EMS arrived to the ED, she was noted to be more awake and has nonfocal exam. Neuro saw the patient on arrival.  The history is provided by the patient and the EMS personnel.    Past Medical History:  Diagnosis Date  . Anxiety   . Deviated nasal septum   . Diverticulitis   . Diverticulosis   . HTN (hypertension)   . Hyperlipemia   . Hyperplastic colon polyp   . Hyponatremia   . Hyponatremia   . MVP (mitral valve prolapse)   . Raynaud phenomenon   . SIADH (syndrome of inappropriate ADH production) [facility])     Patient Active Problem List   Diagnosis Date Noted  . Prediabetes 03/12/2016  . Contact dermatitis 08/05/2015  . Underweight 01/09/2013  . SIADH (syndrome of inappropriate ADH production) ([place]) 11/01/2008  . Hyperlipidemia 08/13/2008  . HYPERTENSION, BENIGN ESSENTIAL, LABILE 08/13/2008  . Mitral valve disorder 08/13/2008  . ALLERGIC RHINITIS, SEASONAL 08/13/2008  . DIVERTICULOSIS, SIGMOID COLON 08/13/2008  . NUMMULAR ECZEMA 08/13/2008  . Osteoporosis 08/13/2008    Past Surgical History:  Procedure Laterality Date  . ABDOMINAL HYSTERECTOMY    . CRYOTHERAPY     for cervical dysplasia  . NM MYOCAR PERF WALL MOTION  11/2007   bruce myoview; perfusion defect in anterior myocardium (breast attenuation);  post-stress EF 77%; normal, low risk study   . TRANSTHORACIC ECHOCARDIOGRAM  11/2007   EF=>55%; mild MR; trace TR    OB History    No data available       Home Medications    Prior to Admission medications   Medication Sig Start Date End Date Taking? Authorizing Provider  amLODipine (NORVASC) 2.5 MG tablet Take 1 tablet (2.5 mg total) by mouth 2 (two) times daily. 12/02/15  Yes Pixie Casino, MD  ibandronate (BONIVA) 150 MG tablet Take 150 mg by mouth every 30 (thirty) days. Take in the morning with a full glass of water, on an empty stomach, and do not take anything else by mouth or lie down for the next 30 min.   Yes Historical Provider, MD  latanoprost (XALATAN) 0.005 % ophthalmic solution Place 1 drop into both eyes at bedtime.   Yes Historical Provider, MD  Biotin 1 MG CAPS Take 1 tablet by mouth daily.    Historical Provider, MD  Calcium-Vitamin D (CALTRATE 600 PLUS-VIT D PO) Take 1 tablet by mouth daily.    Historical Provider, MD  Cholecalciferol (VITAMIN D-3 PO) Take 1 capsule by mouth daily.    Historical Provider, MD  triamcinolone cream (KENALOG) 0.5 % Apply 1 application topically 2 (two) times daily. Patient not taking: Reported on 03/25/2016 08/05/15   Jinny Sanders, MD  vitamin C (ASCORBIC ACID) 500 MG tablet Take 500 mg by mouth daily.    Historical  Provider, MD    Family History Family History  Problem Relation Age of Onset  . Hyperlipidemia Father   . Hypertension Father   . Diabetes Father   . Diabetes Paternal Grandmother   . Cancer Paternal Grandfather     brain tumor  . Hyperlipidemia Other   . Hypertension Other   . Coronary artery disease Maternal Grandmother   . Hyperlipidemia Brother   . Colon cancer Neg Hx     Social History Social History  Substance Use Topics  . Smoking status: Former Smoker    Types: Cigarettes    Quit date: 06/05/1998  . Smokeless tobacco: Never Used  . Alcohol use 8.4 oz/week    14 Cans of beer per week     Comment:  daily     Allergies   Azelastine hcl; Miralax [polyethylene glycol]; Red dye; and Zyban [bupropion]   Review of Systems Review of Systems  Neurological: Positive for syncope and speech difficulty.  All other systems reviewed and are negative.    Physical Exam Updated Vital Signs BP 159/92   Pulse 113   Temp 97.8 F (36.6 C) (Oral)   Resp (!) 42   Ht 5\' 8"  (1.727 m)   Wt 124 lb (56.2 kg)   SpO2 97%   BMI 18.85 kg/m   Physical Exam  Constitutional: She is oriented to person, place, and time.  Alert and oriented. Pupils reactive bilaterally   HENT:  Head: Normocephalic.  Mouth/Throat: Oropharynx is clear and moist.  Eyes: EOM are normal. Pupils are equal, round, and reactive to light.  Neck: Normal range of motion. Neck supple.  Cardiovascular: Normal rate, regular rhythm and normal heart sounds.   Pulmonary/Chest: Effort normal and breath sounds normal. No respiratory distress. She has no wheezes. She has no rales.  Abdominal: Soft. Bowel sounds are normal. She exhibits no distension. There is no tenderness. There is no guarding.  Musculoskeletal: Normal range of motion.  Neurological: She is alert and oriented to person, place, and time. She displays normal reflexes. No cranial nerve deficit. Coordination normal.  Skin: Skin is warm.  Psychiatric: She has a normal mood and affect.  Nursing note and vitals reviewed.    ED Treatments / Results  Labs (all labs ordered are listed, but only abnormal results are displayed) Labs Reviewed  CBC - Abnormal; Notable for the following:       Result Value   WBC 14.3 (*)    All other components within normal limits  DIFFERENTIAL - Abnormal; Notable for the following:    Neutro Abs 8.0 (*)    Lymphs Abs 4.8 (*)    Monocytes Absolute 1.4 (*)    All other components within normal limits  COMPREHENSIVE METABOLIC PANEL - Abnormal; Notable for the following:    Sodium 129 (*)    Potassium 3.3 (*)    Chloride 96 (*)    CO2  19 (*)    Glucose, Bld 239 (*)    ALT 13 (*)    All other components within normal limits  RAPID URINE DRUG SCREEN, HOSP PERFORMED - Abnormal; Notable for the following:    Tetrahydrocannabinol POSITIVE (*)    All other components within normal limits  URINALYSIS, ROUTINE W REFLEX MICROSCOPIC (NOT AT [facility]) - Abnormal; Notable for the following:    APPearance CLOUDY (*)    Glucose, UA 250 (*)    Protein, ur 100 (*)    All other components within normal limits  URINE MICROSCOPIC-ADD ON -  Abnormal; Notable for the following:    Squamous Epithelial / LPF 6-30 (*)    Bacteria, UA RARE (*)    All other components within normal limits  I-STAT CHEM 8, ED - Abnormal; Notable for the following:    Sodium 130 (*)    Potassium 3.4 (*)    Chloride 97 (*)    Glucose, Bld 251 (*)    All other components within normal limits  ETHANOL  PROTIME-INR  APTT  I-STAT TROPOININ, ED    EKG  EKG Interpretation  Date/Time:  Thursday March 25 2016 11:04:43 EDT Ventricular Rate:  117 PR Interval:    QRS Duration: 98 QT Interval:  333 QTC Calculation: 465 R Axis:   78 Text Interpretation:  Sinus tachycardia RSR' in V1 or V2, probably normal variant Probable inferior infarct, old Probable anterolateral infarct, old possible V2 elevation with ischemic changes. Dr. Saunders Revel from cardiology didn't want to activate STEMI  Confirmed by [facility]  MD, [place] ([id number]) on 03/25/2016 12:54:47 PM       Radiology Ct Angio Head W Or Wo Contrast  Result Date: 03/25/2016 CLINICAL DATA:  Code stroke. Right-sided weakness and slurred speech. EXAM: CT ANGIOGRAPHY HEAD AND NECK TECHNIQUE: Multidetector CT imaging of the head and neck was performed using the standard protocol during bolus administration of intravenous contrast. Multiplanar CT image reconstructions and MIPs were obtained to evaluate the vascular anatomy. Carotid stenosis measurements (when applicable) are obtained utilizing NASCET criteria, using the distal  internal carotid diameter as the denominator. CONTRAST:  50 cc Isovue 370 COMPARISON:  Head CT same day. FINDINGS: CTA NECK FINDINGS Aortic arch: Some atherosclerosis of the aortic arch. No dissection. No brachiocephalic vessel origin stenosis. Right carotid system: Common carotid artery widely patent to the bifurcation. Atherosclerosis at the carotid bifurcation without stenosis or significant irregularity. Cervical internal carotid artery is normal. Left carotid system: Common carotid artery widely patent to the bifurcation. Atherosclerotic disease affecting the ICA bulb with some regularity but no stenosis compared to the more distal cervical ICA. Vertebral arteries: Both vertebral artery origins widely patent. Both vertebral arteries widely patent through the cervical region. Skeleton: Negative Other neck: Negative Upper chest: Biapical pleural and parenchymal scarring. Review of the MIP images confirms the above findings CTA HEAD FINDINGS Anterior circulation: Both internal carotid artery is widely patent through the skullbase and siphon regions. The anterior and middle cerebral vessels are patent without proximal stenosis, aneurysm or vascular malformation. Fetal origin PCAs. Posterior circulation: Both vertebral arteries widely patent to the basilar. No basilar stenosis. Posterior circulation branch vessels are normal. Venous sinuses: Patent and normal. Anatomic variants: None significant Delayed phase: No abnormal enhancement Review of the MIP images confirms the above findings IMPRESSION: Some atherosclerotic disease at both carotid bifurcations. Mild irregularity of the proximal ICA on the left, but no measurable stenosis. Two Intracranial vessels are negative.  No large vessel occlusion. These results were called by telephone at the time of interpretation on 03/25/2016 at 11:08 am to Dr. Norman Clay , who verbally acknowledged these results. Electronically Signed   By: Nelson Chimes M.D.   On:  03/25/2016 11:10   Ct Angio Neck W Or Wo Contrast  Result Date: 03/25/2016 CLINICAL DATA:  Code stroke. Right-sided weakness and slurred speech. EXAM: CT ANGIOGRAPHY HEAD AND NECK TECHNIQUE: Multidetector CT imaging of the head and neck was performed using the standard protocol during bolus administration of intravenous contrast. Multiplanar CT image reconstructions and MIPs were obtained to evaluate the vascular anatomy.  Carotid stenosis measurements (when applicable) are obtained utilizing NASCET criteria, using the distal internal carotid diameter as the denominator. CONTRAST:  50 cc Isovue 370 COMPARISON:  Head CT same day. FINDINGS: CTA NECK FINDINGS Aortic arch: Some atherosclerosis of the aortic arch. No dissection. No brachiocephalic vessel origin stenosis. Right carotid system: Common carotid artery widely patent to the bifurcation. Atherosclerosis at the carotid bifurcation without stenosis or significant irregularity. Cervical internal carotid artery is normal. Left carotid system: Common carotid artery widely patent to the bifurcation. Atherosclerotic disease affecting the ICA bulb with some regularity but no stenosis compared to the more distal cervical ICA. Vertebral arteries: Both vertebral artery origins widely patent. Both vertebral arteries widely patent through the cervical region. Skeleton: Negative Other neck: Negative Upper chest: Biapical pleural and parenchymal scarring. Review of the MIP images confirms the above findings CTA HEAD FINDINGS Anterior circulation: Both internal carotid artery is widely patent through the skullbase and siphon regions. The anterior and middle cerebral vessels are patent without proximal stenosis, aneurysm or vascular malformation. Fetal origin PCAs. Posterior circulation: Both vertebral arteries widely patent to the basilar. No basilar stenosis. Posterior circulation branch vessels are normal. Venous sinuses: Patent and normal. Anatomic variants: None  significant Delayed phase: No abnormal enhancement Review of the MIP images confirms the above findings IMPRESSION: Some atherosclerotic disease at both carotid bifurcations. Mild irregularity of the proximal ICA on the left, but no measurable stenosis. Two Intracranial vessels are negative.  No large vessel occlusion. These results were called by telephone at the time of interpretation on 03/25/2016 at 11:08 am to Dr. Norman Clay , who verbally acknowledged these results. Electronically Signed   By: Nelson Chimes M.D.   On: 03/25/2016 11:10   Ct Head Code Stroke W/o Cm  Result Date: 03/25/2016 CLINICAL DATA:  Code stroke. 60 year old female with slurred speech and right-sided weakness. Initial encounter. EXAM: CT HEAD WITHOUT CONTRAST TECHNIQUE: Contiguous axial images were obtained from the base of the skull through the vertex without intravenous contrast. COMPARISON:  08/11/2013. FINDINGS: Brain: Left subinsular subtle hypodensity. Question evidence of acute infarct. No intracranial hemorrhage. Global atrophy without hydrocephalus. No intracranial mass lesion noted on this unenhanced exam. Vascular: Question subtle hyperdensity left middle cerebral artery as can be seen with thrombus. Vascular calcifications. Skull: No acute abnormality. Sinuses/Orbits: No acute orbital abnormality. Visualized sinuses and mastoid air cells are clear. Other: Negative. ASPECTS [facility] Stroke Program Early CT Score) - Ganglionic level infarction (caudate, lentiform nuclei, internal capsule, insula, M1-M3 cortex): 6 - Supraganglionic infarction (M4-M6 cortex): 3 Total score (0-10 with 10 being normal): 9 IMPRESSION: 1. Question acute left subinsular infarct/middle cerebral artery distribution infarct. Question subtle hyperdensity left middle cerebral artery as can be seen with thrombus. Global atrophy. 2. ASPECTS is 9 These results were called by telephone at the time of interpretation on 03/25/2016 at 10:55 am to Dr. Shirlyn Goltz , who verbally acknowledged these results. Electronically Signed   By: Genia Del M.D.   On: 03/25/2016 10:57    Procedures Procedures (including critical care time)  Medications Ordered in ED Medications  iopamidol (ISOVUE-370) 76 % injection (50 mLs  Contrast Given 03/25/16 1100)  sodium chloride 0.9 % bolus 1,000 mL (1,000 mLs Intravenous New Bag/Given 03/25/16 1123)  aspirin tablet 325 mg (325 mg Oral Given 03/25/16 1248)     Initial Impression / Assessment and Plan / ED Course  I have reviewed the triage vital signs and the nursing notes.  Pertinent labs & imaging results  that were available during my care of the patient were reviewed by me and considered in my medical decision making (see chart for details).  Clinical Course    Candice Gray is a 60 y.o. female here with TIA vs syncope. Patient given narcan prior to arrival. nonfocal neuro exam now. Will get CT head, labs, ekg.   11:15 am  EKG showed possible STEMI V2 with ST depressions inferior and lateral leads. Repeat EKG showed elevation resolved. Consulted Dr.End at 11:15 am and he reviewed EKG and doesn't want to activate STEMI but wants cardiology to see patient. Consulted cardiology.   12:56 PM CT head and CT angio unremarkable. Neuro recommend stroke workup. Trop neg and cardiology saw patient and wants further workup as well. Hospitalist to admit. Na 130, slightly lower than usual. UDs + marijuana.    Final Clinical Impressions(s) / ED Diagnoses   Final diagnoses:  Right sided weakness  Right sided weakness    New Prescriptions New Prescriptions   No medications on file     Drenda Freeze, MD 03/25/16 1307

## 2016-03-25 NOTE — H&P (Signed)
History and Physical    TASA SADD [id number] DOB: [birth year] DOA: 03/25/2016  PCP: Eliezer Lofts, MD Patient coming from: home  Chief Complaint: syncope  HPI: Candice Gray is a 60 y.o. 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 Presenting after single episode of syncope. History provided by patient and patient's husband. Patient reports being in her normal state of health recently and denies any recent fevers, chest pain, palpitations, shortness of breath, neck stiffness, headache, dizziness, vertigo, lower extremity swelling, dysuria, frequency, back pain. Patient reports episode occurring around 10:30 this prior to her having her breakfast. She had had coffee that morning but nothing else to eat. Patient reports feeling somewhat flushed and dizzy just prior to episode but denies palpitations or vertigo. Patient does not remember the actual syncopal event. Patient reports sitting down just prior to passing out. Per my exam of patient there are no reports of focal neurological deficit such as unilateral weakness or slurred speech prior to event. Denies any post syncopal confusion or somnolence. EMS was called to the home and found patient to be in a state of mild confusion and to have slurred speech. Patient does endorse regular THC usage but denies any change in her normal regimen.  ED Course: Dr. Posey Pronto when blowing. Cards and neurology evaluating patient.  Review of Systems: As per HPI otherwise 10 point review of systems negative.   Ambulatory Status no restrictions.  Past Medical History:  Diagnosis Date  . Anxiety   . Deviated nasal septum   . Diverticulitis   . Diverticulosis   . HTN (hypertension)   . Hyperlipemia   . Hyperplastic colon polyp   . Hyponatremia   . Hyponatremia   . MVP (mitral valve prolapse)   . Raynaud phenomenon   . SIADH (syndrome of  inappropriate ADH production) ([place])     Past Surgical History:  Procedure Laterality Date  . ABDOMINAL HYSTERECTOMY    . CRYOTHERAPY     for cervical dysplasia  . NM MYOCAR PERF WALL MOTION  11/2007   bruce myoview; perfusion defect in anterior myocardium (breast attenuation); post-stress EF 77%; normal, low risk study   . TRANSTHORACIC ECHOCARDIOGRAM  11/2007   EF=>55%; mild MR; trace TR    Social History   Social History  . Marital status: Married    Spouse name: N/A  . Number of children: 2  . Years of education: N/A   Occupational History  . 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 Smoking status: Former Smoker    Types: Cigarettes    Quit date: 06/05/1998  . Smokeless tobacco: Never Used  . Alcohol use 8.4 oz/week    14 Cans of beer per week     Comment: daily  . Drug use:     Types: Marijuana     Comment: remote  . Sexual activity: Not on file   Other Topics Concern  . Not on file   Social History Narrative   Regular exercise--yes      Diet: fruits and veggies    Allergies  Allergen Reactions  . Azelastine Hcl     REACTION: increased bp  . Miralax [Polyethylene Glycol] Itching  . Red Dye Swelling  . Zyban [Bupropion] Swelling    Family History  Problem Relation Age of Onset  . Hyperlipidemia Father   . Hypertension Father   . Diabetes Father   .  Diabetes Paternal Grandmother   . Cancer Paternal Grandfather     brain tumor  . Hyperlipidemia Other   . Hypertension Other   . Coronary artery disease Maternal Grandmother   . Hyperlipidemia Brother   . Colon cancer Neg Hx     Prior to Admission medications   Medication Sig Start Date End Date Taking? Authorizing Provider  amLODipine (NORVASC) 2.5 MG tablet Take 1 tablet (2.5 mg total) by mouth 2 (two) times daily. 12/02/15  Yes Pixie Casino, MD  ibandronate (BONIVA) 150 MG tablet Take 150 mg by mouth every 30 (thirty) days. Take in the morning with a  full glass of water, on an empty stomach, and do not take anything else by mouth or lie down for the next 30 min.   Yes Historical Provider, MD  latanoprost (XALATAN) 0.005 % ophthalmic solution Place 1 drop into both eyes at bedtime.   Yes Historical Provider, MD  Biotin 1 MG CAPS Take 1 tablet by mouth daily.    Historical Provider, MD  Calcium-Vitamin D (CALTRATE 600 PLUS-VIT D PO) Take 1 tablet by mouth daily.    Historical Provider, MD  Cholecalciferol (VITAMIN D-3 PO) Take 1 capsule by mouth daily.    Historical Provider, MD  triamcinolone cream (KENALOG) 0.5 % Apply 1 application topically 2 (two) times daily. Patient not taking: Reported on 03/25/2016 08/05/15   Jinny Sanders, MD  vitamin C (ASCORBIC ACID) 500 MG tablet Take 500 mg by mouth daily.    Historical Provider, MD    Physical Exam: Vitals:   03/25/16 1200 03/25/16 1215 03/25/16 1230 03/25/16 1443  BP: 147/89 166/99 159/92 (!) 179/108  Pulse: 111 118 113 108  Resp: 20 26 (!) 42 17  Temp:      TempSrc:      SpO2: 100% 99% 97% 99%  Weight:      Height:         General:  Appears calm and comfortable Eyes:  PERRL, EOMI, normal lids, iris ENT:  grossly normal hearing, lips & tongue, mmm Neck:  no LAD, masses or thyromegaly Cardiovascular:  RRR, no m/r/g. No LE edema.  Respiratory:  CTA bilaterally, no w/r/r. Normal respiratory effort. Abdomen:  soft, ntnd, NABS Skin:  no rash or induration seen on limited exam Musculoskeletal:  grossly normal tone BUE/BLE, good ROM, no bony abnormality Psychiatric:  grossly normal mood and affect, speech fluent and appropriate, AOx3 Neurologic:  CN 2-12 grossly intact, moves all extremities in coordinated fashion, sensation intact  Labs on Admission: I have personally reviewed following labs and imaging studies  CBC:  Recent Labs Lab 03/25/16 1039 03/25/16 1041  WBC 14.3*  --   NEUTROABS 8.0*  --   HGB 13.6 14.6  HCT 39.9 43.0  MCV 99.5  --   PLT 253  --    Basic Metabolic  Panel:  Recent Labs Lab 03/25/16 1039 03/25/16 1041  NA 129* 130*  K 3.3* 3.4*  CL 96* 97*  CO2 19*  --   GLUCOSE 239* 251*  BUN 8 9  CREATININE 0.80 0.70  CALCIUM 10.1  --    GFR: Estimated Creatinine Clearance: 66.3 mL/min (by C-G formula based on SCr of 0.7 mg/dL). Liver Function Tests:  Recent Labs Lab 03/25/16 1039  AST 28  ALT 13*  ALKPHOS 52  BILITOT 0.6  PROT 6.9  ALBUMIN 3.9   No results for input(s): LIPASE, AMYLASE in the last 168 hours. No results for input(s): AMMONIA in the  last 168 hours. Coagulation Profile:  Recent Labs Lab 03/25/16 1039  INR 1.09   Cardiac Enzymes: No results for input(s): CKTOTAL, CKMB, CKMBINDEX, TROPONINI in the last 168 hours. BNP (last 3 results) No results for input(s): PROBNP in the last 8760 hours. HbA1C: No results for input(s): HGBA1C in the last 72 hours. CBG: No results for input(s): GLUCAP in the last 168 hours. Lipid Profile: No results for input(s): CHOL, HDL, LDLCALC, TRIG, CHOLHDL, LDLDIRECT in the last 72 hours. Thyroid Function Tests: No results for input(s): TSH, T4TOTAL, FREET4, T3FREE, THYROIDAB in the last 72 hours. Anemia Panel: No results for input(s): VITAMINB12, FOLATE, FERRITIN, TIBC, IRON, RETICCTPCT in the last 72 hours. Urine analysis:    Component Value Date/Time   COLORURINE YELLOW 03/25/2016 1207   APPEARANCEUR CLOUDY (A) 03/25/2016 1207   LABSPEC 1.024 03/25/2016 1207   PHURINE 6.5 03/25/2016 1207   GLUCOSEU 250 (A) 03/25/2016 1207   HGBUR NEGATIVE 03/25/2016 1207   HGBUR moderate 07/03/2010 [place] 03/25/2016 1207   BILIRUBINUR negative 01/13/2012 1259   KETONESUR NEGATIVE 03/25/2016 1207   PROTEINUR 100 (A) 03/25/2016 1207   UROBILINOGEN 0.2 08/11/2013 1335   NITRITE NEGATIVE 03/25/2016 1207   LEUKOCYTESUR NEGATIVE 03/25/2016 1207    Creatinine Clearance: Estimated Creatinine Clearance: 66.3 mL/min (by C-G formula based on SCr of 0.7 mg/dL).  Sepsis  Labs: @LABRCNTIP (procalcitonin:4,lacticidven:4) )No results found for this or any previous visit (from the past 240 hour(s)).   Radiological Exams on Admission: Ct Angio Head W Or Wo Contrast  Result Date: 03/25/2016 CLINICAL DATA:  Code stroke. Right-sided weakness and slurred speech. EXAM: CT ANGIOGRAPHY HEAD AND NECK TECHNIQUE: Multidetector CT imaging of the head and neck was performed using the standard protocol during bolus administration of intravenous contrast. Multiplanar CT image reconstructions and MIPs were obtained to evaluate the vascular anatomy. Carotid stenosis measurements (when applicable) are obtained utilizing NASCET criteria, using the distal internal carotid diameter as the denominator. CONTRAST:  50 cc Isovue 370 COMPARISON:  Head CT same day. FINDINGS: CTA NECK FINDINGS Aortic arch: Some atherosclerosis of the aortic arch. No dissection. No brachiocephalic vessel origin stenosis. Right carotid system: Common carotid artery widely patent to the bifurcation. Atherosclerosis at the carotid bifurcation without stenosis or significant irregularity. Cervical internal carotid artery is normal. Left carotid system: Common carotid artery widely patent to the bifurcation. Atherosclerotic disease affecting the ICA bulb with some regularity but no stenosis compared to the more distal cervical ICA. Vertebral arteries: Both vertebral artery origins widely patent. Both vertebral arteries widely patent through the cervical region. Skeleton: Negative Other neck: Negative Upper chest: Biapical pleural and parenchymal scarring. Review of the MIP images confirms the above findings CTA HEAD FINDINGS Anterior circulation: Both internal carotid artery is widely patent through the skullbase and siphon regions. The anterior and middle cerebral vessels are patent without proximal stenosis, aneurysm or vascular malformation. Fetal origin PCAs. Posterior circulation: Both vertebral arteries widely patent to the  basilar. No basilar stenosis. Posterior circulation branch vessels are normal. Venous sinuses: Patent and normal. Anatomic variants: None significant Delayed phase: No abnormal enhancement Review of the MIP images confirms the above findings IMPRESSION: Some atherosclerotic disease at both carotid bifurcations. Mild irregularity of the proximal ICA on the left, but no measurable stenosis. Two Intracranial vessels are negative.  No large vessel occlusion. These results were called by telephone at the time of interpretation on 03/25/2016 at 11:08 am to Dr. Norman Clay , who verbally acknowledged these results. Electronically  Signed   By: Nelson Chimes M.D.   On: 03/25/2016 11:10   Ct Angio Neck W Or Wo Contrast  Result Date: 03/25/2016 CLINICAL DATA:  Code stroke. Right-sided weakness and slurred speech. EXAM: CT ANGIOGRAPHY HEAD AND NECK TECHNIQUE: Multidetector CT imaging of the head and neck was performed using the standard protocol during bolus administration of intravenous contrast. Multiplanar CT image reconstructions and MIPs were obtained to evaluate the vascular anatomy. Carotid stenosis measurements (when applicable) are obtained utilizing NASCET criteria, using the distal internal carotid diameter as the denominator. CONTRAST:  50 cc Isovue 370 COMPARISON:  Head CT same day. FINDINGS: CTA NECK FINDINGS Aortic arch: Some atherosclerosis of the aortic arch. No dissection. No brachiocephalic vessel origin stenosis. Right carotid system: Common carotid artery widely patent to the bifurcation. Atherosclerosis at the carotid bifurcation without stenosis or significant irregularity. Cervical internal carotid artery is normal. Left carotid system: Common carotid artery widely patent to the bifurcation. Atherosclerotic disease affecting the ICA bulb with some regularity but no stenosis compared to the more distal cervical ICA. Vertebral arteries: Both vertebral artery origins widely patent. Both vertebral  arteries widely patent through the cervical region. Skeleton: Negative Other neck: Negative Upper chest: Biapical pleural and parenchymal scarring. Review of the MIP images confirms the above findings CTA HEAD FINDINGS Anterior circulation: Both internal carotid artery is widely patent through the skullbase and siphon regions. The anterior and middle cerebral vessels are patent without proximal stenosis, aneurysm or vascular malformation. Fetal origin PCAs. Posterior circulation: Both vertebral arteries widely patent to the basilar. No basilar stenosis. Posterior circulation branch vessels are normal. Venous sinuses: Patent and normal. Anatomic variants: None significant Delayed phase: No abnormal enhancement Review of the MIP images confirms the above findings IMPRESSION: Some atherosclerotic disease at both carotid bifurcations. Mild irregularity of the proximal ICA on the left, but no measurable stenosis. Two Intracranial vessels are negative.  No large vessel occlusion. These results were called by telephone at the time of interpretation on 03/25/2016 at 11:08 am to Dr. Norman Clay , who verbally acknowledged these results. Electronically Signed   By: Nelson Chimes M.D.   On: 03/25/2016 11:10   Ct Head Code Stroke W/o Cm  Result Date: 03/25/2016 CLINICAL DATA:  Code stroke. 60 year old female with slurred speech and right-sided weakness. Initial encounter. EXAM: CT HEAD WITHOUT CONTRAST TECHNIQUE: Contiguous axial images were obtained from the base of the skull through the vertex without intravenous contrast. COMPARISON:  08/11/2013. FINDINGS: Brain: Left subinsular subtle hypodensity. Question evidence of acute infarct. No intracranial hemorrhage. Global atrophy without hydrocephalus. No intracranial mass lesion noted on this unenhanced exam. Vascular: Question subtle hyperdensity left middle cerebral artery as can be seen with thrombus. Vascular calcifications. Skull: No acute abnormality.  Sinuses/Orbits: No acute orbital abnormality. Visualized sinuses and mastoid air cells are clear. Other: Negative. ASPECTS [facility] Stroke Program Early CT Score) - Ganglionic level infarction (caudate, lentiform nuclei, internal capsule, insula, M1-M3 cortex): 6 - Supraganglionic infarction (M4-M6 cortex): 3 Total score (0-10 with 10 being normal): 9 IMPRESSION: 1. Question acute left subinsular infarct/middle cerebral artery distribution infarct. Question subtle hyperdensity left middle cerebral artery as can be seen with thrombus. Global atrophy. 2. ASPECTS is 9 These results were called by telephone at the time of interpretation on 03/25/2016 at 10:55 am to Dr. Shirlyn Goltz , who verbally acknowledged these results. Electronically Signed   By: Genia Del M.D.   On: 03/25/2016 10:57    EKG: Independently reviewed. Sinus. No ACS  Assessment/Plan Active Problems:   HYPERTENSION, BENIGN ESSENTIAL, LABILE   SIADH (syndrome of inappropriate ADH production) ([facility])   Syncope   Stroke-like symptom   Hyperglycemia   Leukocytosis   Marijuana use   Syncope: Etiology not immediately clear. Of note is appears that very sisters have been provided different providers.  TIA vs seizure vs vasovagal/orthostatic event vs arrhythmia vs drug-induced event. Neurology and Cardiology following. Orthostatics not done in the ED. CTA head without marked abnormality. CT head questionable acute left subinsular infarct with MCA distribution. Sodium 129 which is near patient's baseline, the BBC 14.3, troponin normal. No change in recent THC usage (+ on UDS).  - Tele - f/u Neuro and Cards recs - MRI - Echo - Neuro checks - Orthostatics  Hyperglycemia: 234 on admission. No h/o the same/DM. No gap. Likely to be deveolping DM as too high for typical acute reaction.  - A1c - CBG Q6  Hyponatremia: chronic secondary to known SIADH. Na 130.  - NS infusion - BMET in am  HTN: - continue norvasc   DVT prophylaxis: Lovenox   Code Status: full  Family Communication: husband  Disposition Plan: pending workup and eval by cards and neuro  Consults called: neuro, cards   Admission status: observation    Candice Gray J MD [facility]  If 7PM-7AM, please contact night-coverage [email or web address] Password [email or web address]  03/25/2016, 2:55 PM

## 2016-03-26 ENCOUNTER — Observation Stay (HOSPITAL_COMMUNITY): Payer: BLUE CROSS/BLUE SHIELD

## 2016-03-26 DIAGNOSIS — E222 Syndrome of inappropriate secretion of antidiuretic hormone: Secondary | ICD-10-CM | POA: Diagnosis not present

## 2016-03-26 DIAGNOSIS — D72820 Lymphocytosis (symptomatic): Secondary | ICD-10-CM

## 2016-03-26 DIAGNOSIS — R4701 Aphasia: Secondary | ICD-10-CM | POA: Diagnosis not present

## 2016-03-26 DIAGNOSIS — R531 Weakness: Secondary | ICD-10-CM | POA: Diagnosis not present

## 2016-03-26 DIAGNOSIS — I10 Essential (primary) hypertension: Secondary | ICD-10-CM | POA: Diagnosis not present

## 2016-03-26 DIAGNOSIS — J449 Chronic obstructive pulmonary disease, unspecified: Secondary | ICD-10-CM | POA: Diagnosis not present

## 2016-03-26 DIAGNOSIS — F1290 Cannabis use, unspecified, uncomplicated: Secondary | ICD-10-CM | POA: Diagnosis not present

## 2016-03-26 DIAGNOSIS — R4781 Slurred speech: Secondary | ICD-10-CM | POA: Diagnosis not present

## 2016-03-26 DIAGNOSIS — R55 Syncope and collapse: Secondary | ICD-10-CM | POA: Diagnosis not present

## 2016-03-26 DIAGNOSIS — R739 Hyperglycemia, unspecified: Secondary | ICD-10-CM | POA: Diagnosis not present

## 2016-03-26 LAB — CBC
HCT: 45 % (ref 36.0–46.0)
HEMOGLOBIN: 16 g/dL — AB (ref 12.0–15.0)
MCH: 34.3 pg — AB (ref 26.0–34.0)
MCHC: 35.6 g/dL (ref 30.0–36.0)
MCV: 96.6 fL (ref 78.0–100.0)
Platelets: 287 10*3/uL (ref 150–400)
RBC: 4.66 MIL/uL (ref 3.87–5.11)
RDW: 12.6 % (ref 11.5–15.5)
WBC: 22.2 10*3/uL — ABNORMAL HIGH (ref 4.0–10.5)

## 2016-03-26 LAB — COMPREHENSIVE METABOLIC PANEL
ALK PHOS: 64 U/L (ref 38–126)
ALT: 15 U/L (ref 14–54)
AST: 33 U/L (ref 15–41)
Albumin: 4.6 g/dL (ref 3.5–5.0)
Anion gap: 15 (ref 5–15)
BUN: 7 mg/dL (ref 6–20)
CALCIUM: 10.6 mg/dL — AB (ref 8.9–10.3)
CO2: 23 mmol/L (ref 22–32)
CREATININE: 0.69 mg/dL (ref 0.44–1.00)
Chloride: 91 mmol/L — ABNORMAL LOW (ref 101–111)
GFR calc non Af Amer: >60 mL/min (ref >60)
GLUCOSE: 165 mg/dL — AB (ref 65–99)
Potassium: 3.2 mmol/L — ABNORMAL LOW (ref 3.5–5.1)
SODIUM: 129 mmol/L — AB (ref 135–145)
Total Bilirubin: 0.7 mg/dL (ref 0.3–1.2)
Total Protein: 7.9 g/dL (ref 6.5–8.1)

## 2016-03-26 LAB — LIPID PANEL
CHOLESTEROL: 298 mg/dL — AB (ref 0–200)
HDL: 177 mg/dL (ref >40)
LDL Cholesterol: 108 mg/dL — ABNORMAL HIGH (ref 0–99)
Total CHOL/HDL Ratio: 1.7 RATIO
Triglycerides: 66 mg/dL (ref <150)
VLDL: 13 mg/dL (ref 0–40)

## 2016-03-26 LAB — DIFFERENTIAL
Basophils Absolute: 0 10*3/uL (ref 0.0–0.1)
Basophils Relative: 0 %
Eosinophils Absolute: 0 10*3/uL (ref 0.0–0.7)
Eosinophils Relative: 0 %
LYMPHS ABS: 1.2 10*3/uL (ref 0.7–4.0)
LYMPHS PCT: 6 %
MONO ABS: 1.9 10*3/uL — AB (ref 0.1–1.0)
MONOS PCT: 10 %
NEUTROS ABS: 15.3 10*3/uL — AB (ref 1.7–7.7)
Neutrophils Relative %: 84 %

## 2016-03-26 LAB — MAGNESIUM: Magnesium: 1.6 mg/dL — ABNORMAL LOW (ref 1.7–2.4)

## 2016-03-26 LAB — GLUCOSE, CAPILLARY
GLUCOSE-CAPILLARY: 163 mg/dL — AB (ref 65–99)
GLUCOSE-CAPILLARY: 182 mg/dL — AB (ref 65–99)
Glucose-Capillary: 169 mg/dL — ABNORMAL HIGH (ref 65–99)
Glucose-Capillary: 119 mg/dL — ABNORMAL HIGH (ref 65–99)

## 2016-03-26 MED ORDER — PHENYTOIN SODIUM EXTENDED 100 MG PO CAPS: 300.0000 mg | ORAL_CAPSULE | Freq: Every day | ORAL | Status: DC

## 2016-03-26 MED ORDER — PHENYTOIN SODIUM EXTENDED 100 MG PO CAPS
300.0000 mg | ORAL_CAPSULE | ORAL | Status: AC
  Administered 2016-03-26 (×3): 300 mg via ORAL
  Filled 2016-03-26 (×3): qty 3

## 2016-03-26 MED ORDER — MAGNESIUM SULFATE 2 GM/50ML IV SOLN
2.0000 g | Freq: Once | INTRAVENOUS | Status: AC
  Administered 2016-03-26: 2 g via INTRAVENOUS
  Filled 2016-03-26: qty 50

## 2016-03-26 MED ORDER — METOPROLOL TARTRATE 25 MG PO TABS
25.0000 mg | ORAL_TABLET | Freq: Two times a day (BID) | ORAL | Status: DC
  Administered 2016-03-26 – 2016-03-27 (×3): 25 mg via ORAL
  Filled 2016-03-26 (×3): qty 1

## 2016-03-26 MED ORDER — POTASSIUM CHLORIDE IN NACL 40-0.9 MEQ/L-% IV SOLN
INTRAVENOUS | Status: DC
  Administered 2016-03-26: 75 mL/h via INTRAVENOUS
  Filled 2016-03-26 (×2): qty 1000

## 2016-03-26 MED ORDER — AMLODIPINE BESYLATE 10 MG PO TABS
10.0000 mg | ORAL_TABLET | Freq: Every day | ORAL | Status: DC
  Administered 2016-03-26 – 2016-03-27 (×2): 10 mg via ORAL
  Filled 2016-03-26 (×2): qty 1

## 2016-03-26 MED ORDER — INSULIN ASPART 100 UNIT/ML ~~LOC~~ SOLN
0.0000 [IU] | Freq: Three times a day (TID) | SUBCUTANEOUS | Status: DC
  Administered 2016-03-26 (×2): 2 [IU] via SUBCUTANEOUS

## 2016-03-26 NOTE — Progress Notes (Signed)
EEG Completed; Results Pending  

## 2016-03-26 NOTE — Progress Notes (Signed)
[facility] PROGRESS NOTE  Candice Gray [id number] DOB: December 24, 1955 DOA: 03/25/2016   PCP: Eliezer Lofts, MD     Assessment/Plan: Active Problems:   HYPERTENSION, BENIGN ESSENTIAL, LABILE   SIADH (syndrome of inappropriate ADH production) (HCC)   Syncope   Stroke-like symptom   Hyperglycemia   Leukocytosis   Marijuana use   Right sided weakness   60 y.o. 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 Presenting after single episode of syncope.EMS was called to the home and found patient to be in a state of mild confusion and to have slurred speech. Patient does endorse regular THC usage . Cardiology neurology consulted.  ASSESSMENT AND PLAN:      1. Syncope with questionable stroke/TIA/seizure - Undergoing workup per neurology. Cannot rule out cardiac etiology given history of palpitation/tachycardia and mitral valve prolapse. Pending echocardiogram to further evaluate her mitral valve prolapse. No murmur heard. Telemetry so far shows sinus tachycardia at rate of 110-120 beats per minutes. Continue to monitor on telemetry. Likely need monitor at discharge versus loop recorder placement depending on findings.   TSH normal. MRI negative for CVA. Repeat orthostatics.EEG to r/o seizure   2. Hypertension-uncontrolled.   Remained hypertensive overnight. Currently on very low-dose Norvasc, increase Norvasc, and add beta blocker    3. HLD - 03/05/2016: Cholesterol 249; HDL 144.10; LDL Cholesterol 94; Triglycerides 52.0; VLDL 10.4  - Consider adding statin.  4. Elevated blood sugar,234 on admission. Likely has diabetes - Pending A1C. Start patient on SSI  5. Hyponatremia-history of SIADH. Baseline sodium around 130 Continue normal saline, fluid restriction   6. Leukocytosis-not sure of this is reactive, so further signs of infection, differential and repeat  chest x-ray, UA negative on admission    DVT prophylaxsis Lovenox  Code Status:  Full code    Family Communication: Discussed in detail with the patient, all imaging results, lab results explained to the patient   Disposition Plan:  Anticipate discharge tomorrow     Consultants:  Neurology  Cardiology  Procedures:  None  Antibiotics: Anti-infectives    None         HPI/Subjective: Does not have any focal deficits, alert, does not remember events  Objective: Vitals:   03/26/16 0154 03/26/16 0612 03/26/16 0807 03/26/16 0910  BP: (!) 157/95 (!) 178/107 (!) 183/106 101/69  Pulse: (!) 114 (!) 118 (!) 120 (!) 115  Resp: (!) 22 20  18   Temp: 99 F (37.2 C) 98.8 F (37.1 C)  98.7 F (37.1 C)  TempSrc: Oral Oral  Oral  SpO2: 100%   100%  Weight:  59.4 kg (131 lb)    Height:        Intake/Output Summary (Last 24 hours) at 03/26/16 [id number] Last data filed at 03/25/16 1314  Gross per 24 hour  Intake                0 ml  Output              400 ml  Net             -400 ml    Exam:  Examination:  General exam: Appears calm and comfortable  Respiratory system: Clear to auscultation. Respiratory effort normal. Cardiovascular system: S1 & S2 heard, RRR. No JVD, murmurs, rubs, gallops or clicks. No pedal edema. Gastrointestinal system: Abdomen is nondistended, soft and nontender. No organomegaly or masses felt. Normal  bowel sounds heard. Central nervous system: Alert and oriented. No focal neurological deficits. Extremities: Symmetric 5 x 5 power. Skin: No rashes, lesions or ulcers Psychiatry: Judgement and insight appear normal. Mood & affect appropriate.     Data Reviewed: I have personally reviewed following labs and imaging studies  Micro Results No results found for this or any previous visit (from the past 240 hour(s)).  Radiology Reports Ct Angio Head W Or Wo Contrast  Result Date: 03/25/2016 CLINICAL DATA:  Code stroke. Right-sided weakness and  slurred speech. EXAM: CT ANGIOGRAPHY HEAD AND NECK TECHNIQUE: Multidetector CT imaging of the head and neck was performed using the standard protocol during bolus administration of intravenous contrast. Multiplanar CT image reconstructions and MIPs were obtained to evaluate the vascular anatomy. Carotid stenosis measurements (when applicable) are obtained utilizing NASCET criteria, using the distal internal carotid diameter as the denominator. CONTRAST:  50 cc Isovue 370 COMPARISON:  Head CT same day. FINDINGS: CTA NECK FINDINGS Aortic arch: Some atherosclerosis of the aortic arch. No dissection. No brachiocephalic vessel origin stenosis. Right carotid system: Common carotid artery widely patent to the bifurcation. Atherosclerosis at the carotid bifurcation without stenosis or significant irregularity. Cervical internal carotid artery is normal. Left carotid system: Common carotid artery widely patent to the bifurcation. Atherosclerotic disease affecting the ICA bulb with some regularity but no stenosis compared to the more distal cervical ICA. Vertebral arteries: Both vertebral artery origins widely patent. Both vertebral arteries widely patent through the cervical region. Skeleton: Negative Other neck: Negative Upper chest: Biapical pleural and parenchymal scarring. Review of the MIP images confirms the above findings CTA HEAD FINDINGS Anterior circulation: Both internal carotid artery is widely patent through the skullbase and siphon regions. The anterior and middle cerebral vessels are patent without proximal stenosis, aneurysm or vascular malformation. Fetal origin PCAs. Posterior circulation: Both vertebral arteries widely patent to the basilar. No basilar stenosis. Posterior circulation branch vessels are normal. Venous sinuses: Patent and normal. Anatomic variants: None significant Delayed phase: No abnormal enhancement Review of the MIP images confirms the above findings IMPRESSION: Some atherosclerotic  disease at both carotid bifurcations. Mild irregularity of the proximal ICA on the left, but no measurable stenosis. Two Intracranial vessels are negative.  No large vessel occlusion. These results were called by telephone at the time of interpretation on 03/25/2016 at 11:08 am to Dr. Norman Clay , who verbally acknowledged these results. Electronically Signed   By: Nelson Chimes M.D.   On: 03/25/2016 11:10   X-ray Chest Pa And Lateral  Result Date: 03/25/2016 CLINICAL DATA:  Syncope. EXAM: CHEST  2 VIEW COMPARISON:  08/11/2013. FINDINGS: Normal sized heart. Clear lungs. The lungs are hyperexpanded with mildly prominent interstitial markings. Diffuse osteopenia. IMPRESSION: No acute abnormality.  COPD. Electronically Signed   By: Claudie Revering M.D.   On: 03/25/2016 15:30   Ct Angio Neck W Or Wo Contrast  Result Date: 03/25/2016 CLINICAL DATA:  Code stroke. Right-sided weakness and slurred speech. EXAM: CT ANGIOGRAPHY HEAD AND NECK TECHNIQUE: Multidetector CT imaging of the head and neck was performed using the standard protocol during bolus administration of intravenous contrast. Multiplanar CT image reconstructions and MIPs were obtained to evaluate the vascular anatomy. Carotid stenosis measurements (when applicable) are obtained utilizing NASCET criteria, using the distal internal carotid diameter as the denominator. CONTRAST:  50 cc Isovue 370 COMPARISON:  Head CT same day. FINDINGS: CTA NECK FINDINGS Aortic arch: Some atherosclerosis of the aortic arch. No dissection. No brachiocephalic vessel origin stenosis.  Right carotid system: Common carotid artery widely patent to the bifurcation. Atherosclerosis at the carotid bifurcation without stenosis or significant irregularity. Cervical internal carotid artery is normal. Left carotid system: Common carotid artery widely patent to the bifurcation. Atherosclerotic disease affecting the ICA bulb with some regularity but no stenosis compared to the more  distal cervical ICA. Vertebral arteries: Both vertebral artery origins widely patent. Both vertebral arteries widely patent through the cervical region. Skeleton: Negative Other neck: Negative Upper chest: Biapical pleural and parenchymal scarring. Review of the MIP images confirms the above findings CTA HEAD FINDINGS Anterior circulation: Both internal carotid artery is widely patent through the skullbase and siphon regions. The anterior and middle cerebral vessels are patent without proximal stenosis, aneurysm or vascular malformation. Fetal origin PCAs. Posterior circulation: Both vertebral arteries widely patent to the basilar. No basilar stenosis. Posterior circulation branch vessels are normal. Venous sinuses: Patent and normal. Anatomic variants: None significant Delayed phase: No abnormal enhancement Review of the MIP images confirms the above findings IMPRESSION: Some atherosclerotic disease at both carotid bifurcations. Mild irregularity of the proximal ICA on the left, but no measurable stenosis. Two Intracranial vessels are negative.  No large vessel occlusion. These results were called by telephone at the time of interpretation on 03/25/2016 at 11:08 am to Dr. Norman Clay , who verbally acknowledged these results. Electronically Signed   By: Nelson Chimes M.D.   On: 03/25/2016 11:10   Mr Brain Wo Contrast  Result Date: 03/25/2016 CLINICAL DATA:  60 year old hypertensive female with a single syncopal episode. Mild confusion and slurred speech. 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 Subsequent encounter. EXAM: MRI HEAD WITHOUT CONTRAST TECHNIQUE: Multiplanar, multiecho pulse sequences of the brain and surrounding structures were obtained without intravenous contrast. COMPARISON:  CT and CT angiogram 03/25/2016.  No comparison MR. FINDINGS: Brain: No acute infarct or intracranial hemorrhage. Mild global atrophy without hydrocephalus. No intracranial mass lesion noted on this unenhanced exam. No mass  seen along the infundibulum.  No pituitary abnormality. No evidence of mesial temporal sclerosis. Vascular: Major intracranial vascular structures are patent. Skull and upper cervical spine: Negative Sinuses/Orbits: No acute orbital abnormality. Minimal mucosal thickening ethmoid sinus air cells. Other: Negative. IMPRESSION: No acute infarct. Global atrophy. Electronically Signed   By: Genia Del M.D.   On: 03/25/2016 18:11   Ct Head Code Stroke W/o Cm  Result Date: 03/25/2016 CLINICAL DATA:  Code stroke. 60 year old female with slurred speech and right-sided weakness. Initial encounter. EXAM: CT HEAD WITHOUT CONTRAST TECHNIQUE: Contiguous axial images were obtained from the base of the skull through the vertex without intravenous contrast. COMPARISON:  08/11/2013. FINDINGS: Brain: Left subinsular subtle hypodensity. Question evidence of acute infarct. No intracranial hemorrhage. Global atrophy without hydrocephalus. No intracranial mass lesion noted on this unenhanced exam. Vascular: Question subtle hyperdensity left middle cerebral artery as can be seen with thrombus. Vascular calcifications. Skull: No acute abnormality. Sinuses/Orbits: No acute orbital abnormality. Visualized sinuses and mastoid air cells are clear. Other: Negative. ASPECTS [facility] Stroke Program Early CT Score) - Ganglionic level infarction (caudate, lentiform nuclei, internal capsule, insula, M1-M3 cortex): 6 - Supraganglionic infarction (M4-M6 cortex): 3 Total score (0-10 with 10 being normal): 9 IMPRESSION: 1. Question acute left subinsular infarct/middle cerebral artery distribution infarct. Question subtle hyperdensity left middle cerebral artery as can be seen with thrombus. Global atrophy. 2. ASPECTS is 9 These results were called by telephone at the time of interpretation on 03/25/2016 at 10:55 am to Dr. Shirlyn Goltz , who verbally acknowledged these results.  Electronically Signed   By: Genia Del M.D.   On: 03/25/2016 10:57      CBC  Recent Labs Lab 03/25/16 1039 03/25/16 1041 03/26/16 0332  WBC 14.3*  --  22.2*  HGB 13.6 14.6 16.0*  HCT 39.9 43.0 45.0  PLT 253  --  287  MCV 99.5  --  96.6  MCH 33.9  --  34.3*  MCHC 34.1  --  35.6  RDW 12.9  --  12.6  LYMPHSABS 4.8*  --   --   MONOABS 1.4*  --   --   EOSABS 0.2  --   --   BASOSABS 0.0  --   --     Chemistries   Recent Labs Lab 03/25/16 1039 03/25/16 1041 03/25/16 1529 03/26/16 0332  NA 129* 130*  --  129*  K 3.3* 3.4*  --  3.2*  CL 96* 97*  --  91*  CO2 19*  --   --  23  GLUCOSE 239* 251*  --  165*  BUN 8 9  --  7  CREATININE 0.80 0.70  --  0.69  CALCIUM 10.1  --   --  10.6*  MG  --   --  1.8  --   AST 28  --   --  33  ALT 13*  --   --  15  ALKPHOS 52  --   --  64  BILITOT 0.6  --   --  0.7   ------------------------------------------------------------------------------------------------------------------ estimated creatinine clearance is 70.1 mL/min (by C-G formula based on SCr of 0.69 mg/dL). ------------------------------------------------------------------------------------------------------------------ No results for input(s): HGBA1C in the last 72 hours. ------------------------------------------------------------------------------------------------------------------  Recent Labs  03/26/16 0332  CHOL 298*  HDL 177  LDLCALC 108*  TRIG 66  CHOLHDL 1.7   ------------------------------------------------------------------------------------------------------------------  Recent Labs  03/25/16 1529  TSH 1.824   ------------------------------------------------------------------------------------------------------------------ No results for input(s): VITAMINB12, FOLATE, FERRITIN, TIBC, IRON, RETICCTPCT in the last 72 hours.  Coagulation profile  Recent Labs Lab 03/25/16 1039  INR 1.09    No results for input(s): DDIMER in the last 72 hours.  Cardiac Enzymes No results for input(s): CKMB, TROPONINI, MYOGLOBIN in  the last 168 hours.  Invalid input(s): CK ------------------------------------------------------------------------------------------------------------------ Invalid input(s): POCBNP   CBG:  Recent Labs Lab 03/26/16 0002 03/26/16 0612  GLUCAP 182* 169*       Studies: Ct Angio Head W Or Wo Contrast  Result Date: 03/25/2016 CLINICAL DATA:  Code stroke. Right-sided weakness and slurred speech. EXAM: CT ANGIOGRAPHY HEAD AND NECK TECHNIQUE: Multidetector CT imaging of the head and neck was performed using the standard protocol during bolus administration of intravenous contrast. Multiplanar CT image reconstructions and MIPs were obtained to evaluate the vascular anatomy. Carotid stenosis measurements (when applicable) are obtained utilizing NASCET criteria, using the distal internal carotid diameter as the denominator. CONTRAST:  50 cc Isovue 370 COMPARISON:  Head CT same day. FINDINGS: CTA NECK FINDINGS Aortic arch: Some atherosclerosis of the aortic arch. No dissection. No brachiocephalic vessel origin stenosis. Right carotid system: Common carotid artery widely patent to the bifurcation. Atherosclerosis at the carotid bifurcation without stenosis or significant irregularity. Cervical internal carotid artery is normal. Left carotid system: Common carotid artery widely patent to the bifurcation. Atherosclerotic disease affecting the ICA bulb with some regularity but no stenosis compared to the more distal cervical ICA. Vertebral arteries: Both vertebral artery origins widely patent. Both vertebral arteries widely patent through the cervical region. Skeleton: Negative Other neck: Negative Upper chest: Biapical  pleural and parenchymal scarring. Review of the MIP images confirms the above findings CTA HEAD FINDINGS Anterior circulation: Both internal carotid artery is widely patent through the skullbase and siphon regions. The anterior and middle cerebral vessels are patent without proximal stenosis,  aneurysm or vascular malformation. Fetal origin PCAs. Posterior circulation: Both vertebral arteries widely patent to the basilar. No basilar stenosis. Posterior circulation branch vessels are normal. Venous sinuses: Patent and normal. Anatomic variants: None significant Delayed phase: No abnormal enhancement Review of the MIP images confirms the above findings IMPRESSION: Some atherosclerotic disease at both carotid bifurcations. Mild irregularity of the proximal ICA on the left, but no measurable stenosis. Two Intracranial vessels are negative.  No large vessel occlusion. These results were called by telephone at the time of interpretation on 03/25/2016 at 11:08 am to Dr. Norman Clay , who verbally acknowledged these results. Electronically Signed   By: Nelson Chimes M.D.   On: 03/25/2016 11:10   X-ray Chest Pa And Lateral  Result Date: 03/25/2016 CLINICAL DATA:  Syncope. EXAM: CHEST  2 VIEW COMPARISON:  08/11/2013. FINDINGS: Normal sized heart. Clear lungs. The lungs are hyperexpanded with mildly prominent interstitial markings. Diffuse osteopenia. IMPRESSION: No acute abnormality.  COPD. Electronically Signed   By: Claudie Revering M.D.   On: 03/25/2016 15:30   Ct Angio Neck W Or Wo Contrast  Result Date: 03/25/2016 CLINICAL DATA:  Code stroke. Right-sided weakness and slurred speech. EXAM: CT ANGIOGRAPHY HEAD AND NECK TECHNIQUE: Multidetector CT imaging of the head and neck was performed using the standard protocol during bolus administration of intravenous contrast. Multiplanar CT image reconstructions and MIPs were obtained to evaluate the vascular anatomy. Carotid stenosis measurements (when applicable) are obtained utilizing NASCET criteria, using the distal internal carotid diameter as the denominator. CONTRAST:  50 cc Isovue 370 COMPARISON:  Head CT same day. FINDINGS: CTA NECK FINDINGS Aortic arch: Some atherosclerosis of the aortic arch. No dissection. No brachiocephalic vessel origin stenosis.  Right carotid system: Common carotid artery widely patent to the bifurcation. Atherosclerosis at the carotid bifurcation without stenosis or significant irregularity. Cervical internal carotid artery is normal. Left carotid system: Common carotid artery widely patent to the bifurcation. Atherosclerotic disease affecting the ICA bulb with some regularity but no stenosis compared to the more distal cervical ICA. Vertebral arteries: Both vertebral artery origins widely patent. Both vertebral arteries widely patent through the cervical region. Skeleton: Negative Other neck: Negative Upper chest: Biapical pleural and parenchymal scarring. Review of the MIP images confirms the above findings CTA HEAD FINDINGS Anterior circulation: Both internal carotid artery is widely patent through the skullbase and siphon regions. The anterior and middle cerebral vessels are patent without proximal stenosis, aneurysm or vascular malformation. Fetal origin PCAs. Posterior circulation: Both vertebral arteries widely patent to the basilar. No basilar stenosis. Posterior circulation branch vessels are normal. Venous sinuses: Patent and normal. Anatomic variants: None significant Delayed phase: No abnormal enhancement Review of the MIP images confirms the above findings IMPRESSION: Some atherosclerotic disease at both carotid bifurcations. Mild irregularity of the proximal ICA on the left, but no measurable stenosis. Two Intracranial vessels are negative.  No large vessel occlusion. These results were called by telephone at the time of interpretation on 03/25/2016 at 11:08 am to Dr. Norman Clay , who verbally acknowledged these results. Electronically Signed   By: Nelson Chimes M.D.   On: 03/25/2016 11:10   Mr Brain Wo Contrast  Result Date: 03/25/2016 CLINICAL DATA:  60 year old hypertensive female with a single  syncopal episode. Mild confusion and slurred speech. 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 Subsequent encounter. EXAM: MRI  HEAD WITHOUT CONTRAST TECHNIQUE: Multiplanar, multiecho pulse sequences of the brain and surrounding structures were obtained without intravenous contrast. COMPARISON:  CT and CT angiogram 03/25/2016.  No comparison MR. FINDINGS: Brain: No acute infarct or intracranial hemorrhage. Mild global atrophy without hydrocephalus. No intracranial mass lesion noted on this unenhanced exam. No mass seen along the infundibulum.  No pituitary abnormality. No evidence of mesial temporal sclerosis. Vascular: Major intracranial vascular structures are patent. Skull and upper cervical spine: Negative Sinuses/Orbits: No acute orbital abnormality. Minimal mucosal thickening ethmoid sinus air cells. Other: Negative. IMPRESSION: No acute infarct. Global atrophy. Electronically Signed   By: Genia Del M.D.   On: 03/25/2016 18:11   Ct Head Code Stroke W/o Cm  Result Date: 03/25/2016 CLINICAL DATA:  Code stroke. 60 year old female with slurred speech and right-sided weakness. Initial encounter. EXAM: CT HEAD WITHOUT CONTRAST TECHNIQUE: Contiguous axial images were obtained from the base of the skull through the vertex without intravenous contrast. COMPARISON:  08/11/2013. FINDINGS: Brain: Left subinsular subtle hypodensity. Question evidence of acute infarct. No intracranial hemorrhage. Global atrophy without hydrocephalus. No intracranial mass lesion noted on this unenhanced exam. Vascular: Question subtle hyperdensity left middle cerebral artery as can be seen with thrombus. Vascular calcifications. Skull: No acute abnormality. Sinuses/Orbits: No acute orbital abnormality. Visualized sinuses and mastoid air cells are clear. Other: Negative. ASPECTS [facility] Stroke Program Early CT Score) - Ganglionic level infarction (caudate, lentiform nuclei, internal capsule, insula, M1-M3 cortex): 6 - Supraganglionic infarction (M4-M6 cortex): 3 Total score (0-10 with 10 being normal): 9 IMPRESSION: 1. Question acute left subinsular  infarct/middle cerebral artery distribution infarct. Question subtle hyperdensity left middle cerebral artery as can be seen with thrombus. Global atrophy. 2. ASPECTS is 9 These results were called by telephone at the time of interpretation on 03/25/2016 at 10:55 am to Dr. Shirlyn Goltz , who verbally acknowledged these results. Electronically Signed   By: Genia Del M.D.   On: 03/25/2016 10:57      Lab Results  Component Value Date   HGBA1C 6.2 01/10/2014   Lab Results  Component Value Date   LDLCALC 108 (H) 03/26/2016   CREATININE 0.69 03/26/2016       Scheduled Meds: .  stroke: mapping our early stages of recovery book   Does not apply Once  . amLODipine  2.5 mg Oral BID  . aspirin  300 mg Rectal Daily   Or  . aspirin  325 mg Oral Daily  . enoxaparin (LOVENOX) injection  40 mg Subcutaneous Q24H  . latanoprost  1 drop Both Eyes QHS  . magnesium sulfate 1 - 4 g bolus IVPB  2 g Intravenous Once  . sodium chloride flush  3 mL Intravenous Q12H   Continuous Infusions: . 0.9 % NaCl with KCl 40 mEq / L       LOS: 0 days    Time spent: >30 MINS    [facility]  [facility] Pager [phone]. If 7PM-7AM, please contact night-coverage at [email or web address], password [facility] 03/26/2016, 9:27 AM  LOS: 0 days

## 2016-03-26 NOTE — Progress Notes (Signed)
MEDICATION RELATED CONSULT NOTE - INITIAL   Pharmacy Consult for Dilantin Indication: seizures  Allergies  Allergen Reactions  . Azelastine Hcl     REACTION: increased bp  . Miralax [Polyethylene Glycol] Itching  . Red Dye Swelling  . Zyban [Bupropion] Swelling    Patient Measurements: Height: 5\' 8"  (172.7 cm) Weight: 131 lb (59.4 kg) IBW/kg (Calculated) : 63.9 Adjusted Body Weight:  Vital Signs: Temp: 98.4 F (36.9 C) (11/03 1400) Temp Source: Oral (11/03 1400) BP: 113/78 (11/03 1400) Pulse Rate: 79 (11/03 1400) Intake/Output from previous day: 11/02 0701 - 11/03 0700 In: -  Out: 400 [Urine:400] Intake/Output from this shift: No intake/output data recorded.  Labs:  Recent Labs  03/25/16 1039 03/25/16 1041 03/25/16 1529 03/26/16 0332 03/26/16 1004  WBC 14.3*  --   --  22.2*  --   HGB 13.6 14.6  --  16.0*  --   HCT 39.9 43.0  --  45.0  --   PLT 253  --   --  287  --   APTT 25  --   --   --   --   CREATININE 0.80 0.70  --  0.69  --   MG  --   --  1.8  --  1.6*  PHOS  --   --  2.1*  --   --   ALBUMIN 3.9  --   --  4.6  --   PROT 6.9  --   --  7.9  --   AST 28  --   --  33  --   ALT 13*  --   --  15  --   ALKPHOS 52  --   --  64  --   BILITOT 0.6  --   --  0.7  --    Estimated Creatinine Clearance: 70.1 mL/min (by C-G formula based on SCr of 0.69 mg/dL).   Microbiology: No results found for this or any previous visit (from the past 720 hour(s)).  Medical History: Past Medical History:  Diagnosis Date  . Anxiety   . Deviated nasal septum   . Diverticulitis   . Diverticulosis   . HTN (hypertension)   . Hyperlipemia   . Hyperplastic colon polyp   . Hyponatremia   . Hyponatremia   . MVP (mitral valve prolapse)   . Raynaud phenomenon   . SIADH (syndrome of inappropriate ADH production) (HCC)     Medications:  Prescriptions Prior to Admission  Medication Sig Dispense Refill Last Dose  . amLODipine (NORVASC) 2.5 MG tablet Take 1 tablet (2.5 mg  total) by mouth 2 (two) times daily. 180 tablet 3 Past Week at Unknown time  . ibandronate (BONIVA) 150 MG tablet Take 150 mg by mouth every 30 (thirty) days. Take in the morning with a full glass of water, on an empty stomach, and do not take anything else by mouth or lie down for the next 30 min.   Past Month at Unknown time  . latanoprost (XALATAN) 0.005 % ophthalmic solution Place 1 drop into both eyes at bedtime.   Past Week at Unknown time  . Biotin 1 MG CAPS Take 1 tablet by mouth daily.   unk at [employer]  . Calcium-Vitamin D (CALTRATE 600 PLUS-VIT D PO) Take 1 tablet by mouth daily.   unk at [employer]  . Cholecalciferol (VITAMIN D-3 PO) Take 1 capsule by mouth daily.   unk at unk  . triamcinolone cream (KENALOG) 0.5 % Apply 1 application  topically 2 (two) times daily. (Patient not taking: Reported on 03/25/2016) 30 g 0 Not Taking at Unknown time  . vitamin C (ASCORBIC ACID) 500 MG tablet Take 500 mg by mouth daily.   unk at unk    Assessment: Syncope  60 y/o F presents with syncope, R sided-weakness, and inability to speak. Resolved. EEG: abnormal EEG with evidence of a seizure tendency. LDL 108. No acute CVA noted.   Goal of Therapy:  Absence of seizures Phenytoin level 10-20  Plan:  Load phenytoin orally with 900mg  tonight then start 300mg  daily. Level in 5-7 days   Jarelis Ehlert S. Alford Highland, PharmD, BCPS Clinical Staff Pharmacist Pager [phone]  Eilene Ghazi Stillinger 03/26/2016,5:07 PM

## 2016-03-26 NOTE — Progress Notes (Signed)
Patient Name: Candice Gray Date of Encounter: 03/26/2016  Primary Cardiologist: Dr. [facility] Problem List     Active Problems:   HYPERTENSION, BENIGN ESSENTIAL, LABILE   SIADH (syndrome of inappropriate ADH production) (HCC)   Syncope   Stroke-like symptom   Hyperglycemia   Leukocytosis   Marijuana use   Right sided weakness     Subjective   Feeling well. No chest pain, sob or palpitations.   Inpatient Medications    Scheduled Meds: .  stroke: mapping our early stages of recovery book   Does not apply Once  . amLODipine  10 mg Oral Daily  . aspirin  300 mg Rectal Daily   Or  . aspirin  325 mg Oral Daily  . enoxaparin (LOVENOX) injection  40 mg Subcutaneous Q24H  . insulin aspart  0-9 Units Subcutaneous TID WC  . latanoprost  1 drop Both Eyes QHS  . magnesium sulfate 1 - 4 g bolus IVPB  2 g Intravenous Once  . metoprolol tartrate  25 mg Oral BID  . sodium chloride flush  3 mL Intravenous Q12H   Continuous Infusions: . 0.9 % NaCl with KCl 40 mEq / L     PRN Meds: acetaminophen **OR** acetaminophen, ondansetron **OR** ondansetron (ZOFRAN) IV   Vital Signs    Vitals:   03/26/16 0154 03/26/16 0612 03/26/16 0807 03/26/16 0910  BP: (!) 157/95 (!) 178/107 (!) 183/106 101/69  Pulse: (!) 114 (!) 118 (!) 120 (!) 115  Resp: (!) 22 20  18   Temp: 99 F (37.2 C) 98.8 F (37.1 C)  98.7 F (37.1 C)  TempSrc: Oral Oral  Oral  SpO2: 100%   100%  Weight:  131 lb (59.4 kg)    Height:        Intake/Output Summary (Last 24 hours) at 03/26/16 1118 Last data filed at 03/25/16 1314  Gross per 24 hour  Intake                0 ml  Output              400 ml  Net             -400 ml   Filed Weights   03/25/16 1101 03/26/16 0612  Weight: 124 lb (56.2 kg) 131 lb (59.4 kg)    Physical Exam    GEN: Well nourished, well developed, in no acute distress.  HEENT: Grossly normal.  Neck: Supple, no JVD, carotid bruits, or masses. Cardiac: RRR, no murmurs, rubs, or  gallops. No clubbing, cyanosis, edema.  Radials/DP/PT 2+ and equal bilaterally.  Respiratory:  Respirations regular and unlabored, clear to auscultation bilaterally. GI: Soft, nontender, nondistended, BS + x 4. MS: no deformity or atrophy. Skin: warm and dry, no rash. Neuro:  Strength and sensation are intact. Psych: AAOx3.  Normal affect.  Labs    CBC  Recent Labs  03/25/16 1039 03/25/16 1041 03/26/16 0332 03/26/16 1004  WBC 14.3*  --  22.2*  --   NEUTROABS 8.0*  --   --  15.3*  HGB 13.6 14.6 16.0*  --   HCT 39.9 43.0 45.0  --   MCV 99.5  --  96.6  --   PLT 253  --  287  --    Basic Metabolic Panel  Recent Labs  03/25/16 1039 03/25/16 1041 03/25/16 1529 03/26/16 0332 03/26/16 1004  NA 129* 130*  --  129*  --   K 3.3* 3.4*  --  3.2*  --   CL 96* 97*  --  91*  --   CO2 19*  --   --  23  --   GLUCOSE 239* 251*  --  165*  --   BUN 8 9  --  7  --   CREATININE 0.80 0.70  --  0.69  --   CALCIUM 10.1  --   --  10.6*  --   MG  --   --  1.8  --  1.6*  PHOS  --   --  2.1*  --   --    Liver Function Tests  Recent Labs  03/25/16 1039 03/26/16 0332  AST 28 33  ALT 13* 15  ALKPHOS 52 64  BILITOT 0.6 0.7  PROT 6.9 7.9  ALBUMIN 3.9 4.6   No results for input(s): LIPASE, AMYLASE in the last 72 hours. Cardiac Enzymes No results for input(s): CKTOTAL, CKMB, CKMBINDEX, TROPONINI in the last 72 hours. BNP Invalid input(s): POCBNP D-Dimer No results for input(s): DDIMER in the last 72 hours. Hemoglobin A1C No results for input(s): HGBA1C in the last 72 hours. Fasting Lipid Panel  Recent Labs  03/26/16 0332  CHOL 298*  HDL 177  LDLCALC 108*  TRIG 66  CHOLHDL 1.7   Thyroid Function Tests  Recent Labs  03/25/16 1529  TSH 1.824    Telemetry    Sinus tachycardia at rate of 120-140s with intermittent ventricular bigemeny - Personally Reviewed  ECG    N/A  Radiology    Ct Angio Head W Or Wo Contrast  Result Date: 03/25/2016 CLINICAL DATA:  Code  stroke. Right-sided weakness and slurred speech. EXAM: CT ANGIOGRAPHY HEAD AND NECK TECHNIQUE: Multidetector CT imaging of the head and neck was performed using the standard protocol during bolus administration of intravenous contrast. Multiplanar CT image reconstructions and MIPs were obtained to evaluate the vascular anatomy. Carotid stenosis measurements (when applicable) are obtained utilizing NASCET criteria, using the distal internal carotid diameter as the denominator. CONTRAST:  50 cc Isovue 370 COMPARISON:  Head CT same day. FINDINGS: CTA NECK FINDINGS Aortic arch: Some atherosclerosis of the aortic arch. No dissection. No brachiocephalic vessel origin stenosis. Right carotid system: Common carotid artery widely patent to the bifurcation. Atherosclerosis at the carotid bifurcation without stenosis or significant irregularity. Cervical internal carotid artery is normal. Left carotid system: Common carotid artery widely patent to the bifurcation. Atherosclerotic disease affecting the ICA bulb with some regularity but no stenosis compared to the more distal cervical ICA. Vertebral arteries: Both vertebral artery origins widely patent. Both vertebral arteries widely patent through the cervical region. Skeleton: Negative Other neck: Negative Upper chest: Biapical pleural and parenchymal scarring. Review of the MIP images confirms the above findings CTA HEAD FINDINGS Anterior circulation: Both internal carotid artery is widely patent through the skullbase and siphon regions. The anterior and middle cerebral vessels are patent without proximal stenosis, aneurysm or vascular malformation. Fetal origin PCAs. Posterior circulation: Both vertebral arteries widely patent to the basilar. No basilar stenosis. Posterior circulation branch vessels are normal. Venous sinuses: Patent and normal. Anatomic variants: None significant Delayed phase: No abnormal enhancement Review of the MIP images confirms the above findings  IMPRESSION: Some atherosclerotic disease at both carotid bifurcations. Mild irregularity of the proximal ICA on the left, but no measurable stenosis. Two Intracranial vessels are negative.  No large vessel occlusion. These results were called by telephone at the time of interpretation on 03/25/2016 at 11:08 am to Dr. Norman Clay ,  who verbally acknowledged these results. Electronically Signed   By: Nelson Chimes M.D.   On: 03/25/2016 11:10   X-ray Chest Pa And Lateral  Result Date: 03/25/2016 CLINICAL DATA:  Syncope. EXAM: CHEST  2 VIEW COMPARISON:  08/11/2013. FINDINGS: Normal sized heart. Clear lungs. The lungs are hyperexpanded with mildly prominent interstitial markings. Diffuse osteopenia. IMPRESSION: No acute abnormality.  COPD. Electronically Signed   By: Claudie Revering M.D.   On: 03/25/2016 15:30   Ct Angio Neck W Or Wo Contrast  Result Date: 03/25/2016 CLINICAL DATA:  Code stroke. Right-sided weakness and slurred speech. EXAM: CT ANGIOGRAPHY HEAD AND NECK TECHNIQUE: Multidetector CT imaging of the head and neck was performed using the standard protocol during bolus administration of intravenous contrast. Multiplanar CT image reconstructions and MIPs were obtained to evaluate the vascular anatomy. Carotid stenosis measurements (when applicable) are obtained utilizing NASCET criteria, using the distal internal carotid diameter as the denominator. CONTRAST:  50 cc Isovue 370 COMPARISON:  Head CT same day. FINDINGS: CTA NECK FINDINGS Aortic arch: Some atherosclerosis of the aortic arch. No dissection. No brachiocephalic vessel origin stenosis. Right carotid system: Common carotid artery widely patent to the bifurcation. Atherosclerosis at the carotid bifurcation without stenosis or significant irregularity. Cervical internal carotid artery is normal. Left carotid system: Common carotid artery widely patent to the bifurcation. Atherosclerotic disease affecting the ICA bulb with some regularity but no  stenosis compared to the more distal cervical ICA. Vertebral arteries: Both vertebral artery origins widely patent. Both vertebral arteries widely patent through the cervical region. Skeleton: Negative Other neck: Negative Upper chest: Biapical pleural and parenchymal scarring. Review of the MIP images confirms the above findings CTA HEAD FINDINGS Anterior circulation: Both internal carotid artery is widely patent through the skullbase and siphon regions. The anterior and middle cerebral vessels are patent without proximal stenosis, aneurysm or vascular malformation. Fetal origin PCAs. Posterior circulation: Both vertebral arteries widely patent to the basilar. No basilar stenosis. Posterior circulation branch vessels are normal. Venous sinuses: Patent and normal. Anatomic variants: None significant Delayed phase: No abnormal enhancement Review of the MIP images confirms the above findings IMPRESSION: Some atherosclerotic disease at both carotid bifurcations. Mild irregularity of the proximal ICA on the left, but no measurable stenosis. Two Intracranial vessels are negative.  No large vessel occlusion. These results were called by telephone at the time of interpretation on 03/25/2016 at 11:08 am to Dr. Norman Clay , who verbally acknowledged these results. Electronically Signed   By: Nelson Chimes M.D.   On: 03/25/2016 11:10   Mr Brain Wo Contrast  Result Date: 03/25/2016 CLINICAL DATA:  60 year old hypertensive female with a single syncopal episode. Mild confusion and slurred speech. 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 Subsequent encounter. EXAM: MRI HEAD WITHOUT CONTRAST TECHNIQUE: Multiplanar, multiecho pulse sequences of the brain and surrounding structures were obtained without intravenous contrast. COMPARISON:  CT and CT angiogram 03/25/2016.  No comparison MR. FINDINGS: Brain: No acute infarct or intracranial hemorrhage. Mild global atrophy without hydrocephalus. No intracranial mass lesion noted on  this unenhanced exam. No mass seen along the infundibulum.  No pituitary abnormality. No evidence of mesial temporal sclerosis. Vascular: Major intracranial vascular structures are patent. Skull and upper cervical spine: Negative Sinuses/Orbits: No acute orbital abnormality. Minimal mucosal thickening ethmoid sinus air cells. Other: Negative. IMPRESSION: No acute infarct. Global atrophy. Electronically Signed   By: Genia Del M.D.   On: 03/25/2016 18:11   Ct Head Code Stroke W/o Cm  Result Date: 03/25/2016 CLINICAL  DATA:  Code stroke. 60 year old female with slurred speech and right-sided weakness. Initial encounter. EXAM: CT HEAD WITHOUT CONTRAST TECHNIQUE: Contiguous axial images were obtained from the base of the skull through the vertex without intravenous contrast. COMPARISON:  08/11/2013. FINDINGS: Brain: Left subinsular subtle hypodensity. Question evidence of acute infarct. No intracranial hemorrhage. Global atrophy without hydrocephalus. No intracranial mass lesion noted on this unenhanced exam. Vascular: Question subtle hyperdensity left middle cerebral artery as can be seen with thrombus. Vascular calcifications. Skull: No acute abnormality. Sinuses/Orbits: No acute orbital abnormality. Visualized sinuses and mastoid air cells are clear. Other: Negative. ASPECTS [facility] Stroke Program Early CT Score) - Ganglionic level infarction (caudate, lentiform nuclei, internal capsule, insula, M1-M3 cortex): 6 - Supraganglionic infarction (M4-M6 cortex): 3 Total score (0-10 with 10 being normal): 9 IMPRESSION: 1. Question acute left subinsular infarct/middle cerebral artery distribution infarct. Question subtle hyperdensity left middle cerebral artery as can be seen with thrombus. Global atrophy. 2. ASPECTS is 9 These results were called by telephone at the time of interpretation on 03/25/2016 at 10:55 am to Dr. Shirlyn Goltz , who verbally acknowledged these results. Electronically Signed   By: Genia Del  M.D.   On: 03/25/2016 10:57    Cardiac Studies   Pending echo  Patient Profile     Candice Gray is a 60 y.o. female with a history of Labile hypertension, Raynaud's phenomena, hyperlipidemia, SIADH, remote history of mitral wall prolapse with associated palpitation who presented for evaluation of acute onset syncope and slurred speech 03/25/16  Assessment & Plan    1. Syncope with questionable stroke vs seizure - Undergoing workup per neurology. Head CT and MR of brain negative for acute abnormality. Less likely orthostatic as she was hypertensive on presentation.   Orthostatic VS for the past 24 hrs:  BP- Lying Pulse- Lying BP- Sitting Pulse- Sitting BP- Standing at 0 minutes Pulse- Standing at 0 minutes  03/25/16 1446 (!) 179/108 116 (!) 185/102 107 (!) 173/115 121    -  Will need echocardiogram to further evaluate her mitral valve prolapse. No murmur heard. Telemetry so far shows sinus tachycardia at rate of  120-140s. Continue to monitor on telemetry. Likely need monitor at discharge versus loop recorder placement depending on findings. TSH normal.   2. Sinus tachycardia with ventricular Bigeminy - BB added this morning.   3. Hypertension - managed by primary  3. HLD - 03/05/2016: Cholesterol 249; HDL 144.10; LDL Cholesterol 94; Triglycerides 52.0; VLDL 10.4  - Consider adding statin.  4. Elevated blood sugar - Pending A1C  5. Leukocytosis - increasing. Per primary.    Signed, Leanor Kail, PA  03/26/2016, 11:18 AM

## 2016-03-26 NOTE — Procedures (Signed)
EEG Report  Clinical History:  Found unresponsive with language disturbance and right sided weakness, resolving completely upon arriving to the hospital.  She has some confusion as to what happened.  Technical Summary:  A 19 channel digital EEG recording was performed using the 10-20 international system of electrode placement.  Bipolar and referential montages were used.  The total recording time was approx 20 minutes.    EEG Description:  There is a posterior dominant rhythm of 9 Hz reactive eye opening and closure.  Both hemispheres are symmetrical and there is no focal slowing present.  Photic stimulation produces no driving response and elicits no further abnormalities.  On 2 occasions, there are epileptiform with maximum at F7 and T3. These do not evolve into electrographic seizures.  No sleep was recorded.     Impression:   This is an abnormal EEG in the awake sleep.  There is evidence of a seizure tendency emanating from the left anterior to mid temporal lobe, putting the patient at risk of focal seizures with or without secondary generalization.     Rogue Jury, MS, MD

## 2016-03-26 NOTE — Progress Notes (Signed)
STROKE TEAM PROGRESS NOTE   HISTORY OF PRESENT ILLNESS (per record) Candice Gray is an 60 y.o. female with hx as below was found by her son unresponsive at the table this am, upon awakening she was mute and weaker on the right side. By the time she came to the ER she was speaking, moving all extremities equally with mild confusion to what happened. She was last known well: 03/25/16 at  945am. Patient was not administered IV t-PA. She was admitted for further evaluation and treatment.   SUBJECTIVE (INTERVAL HISTORY) Her husband  is at the bedside.  Overall she feels her condition is completely resolved.    OBJECTIVE Temp:  [97.8 F (36.6 C)-99.1 F (37.3 C)] 98.7 F (37.1 C) (11/03 0910) Pulse Rate:  [98-120] 115 (11/03 0910) Cardiac Rhythm: Sinus tachycardia (11/03 0700) Resp:  [14-42] 18 (11/03 0910) BP: (101-200)/(69-117) 101/69 (11/03 0910) SpO2:  [97 %-100 %] 100 % (11/03 0910) Weight:  [59.4 kg (131 lb)] 59.4 kg (131 lb) (11/03 0612)  CBC:  Recent Labs Lab 03/25/16 1039 03/25/16 1041 03/26/16 0332 03/26/16 1004  WBC 14.3*  --  22.2*  --   NEUTROABS 8.0*  --   --  15.3*  HGB 13.6 14.6 16.0*  --   HCT 39.9 43.0 45.0  --   MCV 99.5  --  96.6  --   PLT 253  --  287  --     Basic Metabolic Panel:  Recent Labs Lab 03/25/16 1039 03/25/16 1041 03/25/16 1529 03/26/16 0332 03/26/16 1004  NA 129* 130*  --  129*  --   K 3.3* 3.4*  --  3.2*  --   CL 96* 97*  --  91*  --   CO2 19*  --   --  23  --   GLUCOSE 239* 251*  --  165*  --   BUN 8 9  --  7  --   CREATININE 0.80 0.70  --  0.69  --   CALCIUM 10.1  --   --  10.6*  --   MG  --   --  1.8  --  1.6*  PHOS  --   --  2.1*  --   --     Lipid Panel:    Component Value Date/Time   CHOL 298 (H) 03/26/2016 0332   TRIG 66 03/26/2016 0332   HDL 177 03/26/2016 0332   CHOLHDL 1.7 03/26/2016 0332   VLDL 13 03/26/2016 0332   LDLCALC 108 (H) 03/26/2016 0332   HgbA1c:  Lab Results  Component Value Date   HGBA1C 6.2  01/10/2014   Urine Drug Screen:    Component Value Date/Time   LABOPIA NONE DETECTED 03/25/2016 1207   COCAINSCRNUR NONE DETECTED 03/25/2016 1207   LABBENZ NONE DETECTED 03/25/2016 1207   AMPHETMU NONE DETECTED 03/25/2016 1207   THCU POSITIVE (A) 03/25/2016 1207   LABBARB NONE DETECTED 03/25/2016 1207      IMAGING  Ct Angio Head W Or Wo Contrast Ct Angio Neck W Or Wo Contrast 03/25/2016 Some atherosclerotic disease at both carotid bifurcations. Mild irregularity of the proximal ICA on the left, but no measurable stenosis. Two Intracranial vessels are negative.  No large vessel occlusion.   Mr Brain Wo Contrast 05/26/2015 No acute infarct. Global atrophy.   Ct Head Code Stroke W/o Cm 03/25/2016 1. Question acute left subinsular infarct/middle cerebral artery distribution infarct. Question subtle hyperdensity left middle cerebral artery as can be seen with thrombus. Global  atrophy. 2. ASPECTS is 9    PHYSICAL EXAM   ASSESSMENT/PLAN Candice Gray is a 60 y.o. 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 She did not receive IV t-PA.   Possible TIA secondary to small vessel disease vs seizure.   Resultant  Neuro deficits resolved   MRI  No acute stroke  CTA H&N  Diffuse atherosclerosis  2D Echo  pending   EEG: abnormal EEG with evidence of a seizure tendency emanating from the left anterior to mid temporal lobe, putting the patient at risk of focal seizures with or without secondary generalization.     LDL 108  HgbA1c pending  Lovenox 40 mg sq daily for VTE prophylaxis  Diet Heart Room service appropriate? Yes; Fluid consistency: Thin  Diet regular Room service appropriate? Yes; Fluid consistency: Thin  No antithrombotic prior to admission, now on aspirin 325 mg daily  Ongoing aggressive stroke  risk factor management  Therapy recommendations:  pending   Disposition:  pending   Hypertensive Emergency  BP as high as 2000/111 on arrival in setting of neurologic symptoms  Remains elevated this am  Hyperlipidemia  Home meds:  No statin  LDL 108, goal < 70 for stroke  Consider addition of statin  Hyperglycemia  HgbA1c pending   Other Stroke Risk Factors  Former Cigarette smoker  THC use positive this admission  ETOH use, advised to drink no more than 1 drink(s) a day  Other Active Problems  SIADH  Raynaud's syndrome  Syncope  Leukocytosis  MVP  Hospital day # 0  Radene Journey [facility] for Pager information 03/26/2016 11:41 AM  I have personally examined this patient, reviewed notes, independently viewed imaging studies, participated in medical decision making and plan of care.ROS completed by me personally and pertinent positives fully documented  I have made any additions or clarifications directly to the above note. Agree with note above. Patient was clinical presentation unresponsiveness with some extremity jerking followed by some focal right-sided transient weakness sounds more consistent with seizure activity. Abnormal EEG supports this further. MRI shows no definite stroke. Recommend start Keppra 500 mg twice daily. Discussed with patient and family. Greater than 50% time during this 35 minute visit was spent on counseling and coordination of care about TIA, seizure and answering questions. Antony Contras, MD Medical Director [facility] Pager: [phone] 03/26/2016 4:56 PM  To contact Stroke Continuity provider, please refer to [email or web address] After hours, contact General Neurology

## 2016-03-27 ENCOUNTER — Other Ambulatory Visit (HOSPITAL_COMMUNITY): Payer: BLUE CROSS/BLUE SHIELD

## 2016-03-27 ENCOUNTER — Observation Stay (HOSPITAL_BASED_OUTPATIENT_CLINIC_OR_DEPARTMENT_OTHER): Payer: BLUE CROSS/BLUE SHIELD

## 2016-03-27 DIAGNOSIS — I6789 Other cerebrovascular disease: Secondary | ICD-10-CM

## 2016-03-27 DIAGNOSIS — R55 Syncope and collapse: Principal | ICD-10-CM

## 2016-03-27 DIAGNOSIS — I10 Essential (primary) hypertension: Secondary | ICD-10-CM | POA: Diagnosis not present

## 2016-03-27 DIAGNOSIS — R4701 Aphasia: Secondary | ICD-10-CM | POA: Diagnosis not present

## 2016-03-27 LAB — DIFFERENTIAL
BASOS ABS: 0 10*3/uL (ref 0.0–0.1)
BASOS PCT: 0 %
EOS ABS: 0 10*3/uL (ref 0.0–0.7)
Eosinophils Relative: 0 %
Lymphocytes Relative: 19 %
Lymphs Abs: 2.4 10*3/uL (ref 0.7–4.0)
Monocytes Absolute: 1.6 10*3/uL — ABNORMAL HIGH (ref 0.1–1.0)
Monocytes Relative: 12 %
NEUTROS PCT: 69 %
Neutro Abs: 8.9 10*3/uL — ABNORMAL HIGH (ref 1.7–7.7)

## 2016-03-27 LAB — CBC
HCT: 43.8 % (ref 36.0–46.0)
HEMOGLOBIN: 15.1 g/dL — AB (ref 12.0–15.0)
MCH: 33.7 pg (ref 26.0–34.0)
MCHC: 34.5 g/dL (ref 30.0–36.0)
MCV: 97.8 fL (ref 78.0–100.0)
Platelets: 237 10*3/uL (ref 150–400)
RBC: 4.48 MIL/uL (ref 3.87–5.11)
RDW: 12.8 % (ref 11.5–15.5)
WBC: 13 10*3/uL — AB (ref 4.0–10.5)

## 2016-03-27 LAB — COMPREHENSIVE METABOLIC PANEL
ALBUMIN: 3.5 g/dL (ref 3.5–5.0)
ALT: 18 U/L (ref 14–54)
ANION GAP: 10 (ref 5–15)
AST: 33 U/L (ref 15–41)
Alkaline Phosphatase: 52 U/L (ref 38–126)
BUN: 10 mg/dL (ref 6–20)
CO2: 29 mmol/L (ref 22–32)
Calcium: 9.8 mg/dL (ref 8.9–10.3)
Chloride: 90 mmol/L — ABNORMAL LOW (ref 101–111)
Creatinine, Ser: 0.78 mg/dL (ref 0.44–1.00)
GFR calc Af Amer: >60 mL/min (ref >60)
GFR calc non Af Amer: >60 mL/min (ref >60)
GLUCOSE: 118 mg/dL — AB (ref 65–99)
POTASSIUM: 3.2 mmol/L — AB (ref 3.5–5.1)
SODIUM: 129 mmol/L — AB (ref 135–145)
Total Bilirubin: 0.8 mg/dL (ref 0.3–1.2)
Total Protein: 6.6 g/dL (ref 6.5–8.1)

## 2016-03-27 LAB — GLUCOSE, CAPILLARY
GLUCOSE-CAPILLARY: 103 mg/dL — AB (ref 65–99)
Glucose-Capillary: 118 mg/dL — ABNORMAL HIGH (ref 65–99)
Glucose-Capillary: 123 mg/dL — ABNORMAL HIGH (ref 65–99)

## 2016-03-27 LAB — HEMOGLOBIN A1C
HEMOGLOBIN A1C: 5.5 % (ref 4.8–5.6)
MEAN PLASMA GLUCOSE: 111 mg/dL

## 2016-03-27 MED ORDER — METOPROLOL TARTRATE 25 MG PO TABS: 12.5000 mg | ORAL_TABLET | Freq: Two times a day (BID) | ORAL | 0 refills | Status: DC

## 2016-03-27 MED ORDER — LEVETIRACETAM 500 MG PO TABS: 500.0000 mg | ORAL_TABLET | Freq: Two times a day (BID) | ORAL | 0 refills | Status: DC

## 2016-03-27 NOTE — Discharge Instructions (Signed)
Levetiracetam tablets What is this medicine? LEVETIRACETAM (lee ve tye RA se tam) is an antiepileptic drug. It is used with other medicines to treat certain types of seizures. This medicine may be used for other purposes; ask your health care provider or pharmacist if you have questions. What should I tell my health care provider before I take this medicine? They need to know if you have any of these conditions: -kidney disease -suicidal thoughts, plans, or attempt; a previous suicide attempt by you or a family member -an unusual or allergic reaction to levetiracetam, other medicines, foods, dyes, or preservatives -pregnant or trying to get pregnant -breast-feeding How should I use this medicine? Take this medicine by mouth with a glass of water. Follow the directions on the prescription label. Swallow the tablets whole. Do not crush or chew this medicine. You may take this medicine with or without food. Take your doses at regular intervals. Do not take your medicine more often than directed. Do not stop taking this medicine or any of your seizure medicines unless instructed by your doctor or health care professional. Stopping your medicine suddenly can increase your seizures or their severity. A special MedGuide will be given to you by the pharmacist with each prescription and refill. Be sure to read this information carefully each time. Contact your pediatrician or health care professional regarding the use of this medication in children. While this drug may be prescribed for children as young as 32 years of age for selected conditions, precautions do apply. Overdosage: If you think you have taken too much of this medicine contact a poison control center or emergency room at once. NOTE: This medicine is only for you. Do not share this medicine with others. What if I miss a dose? If you miss a dose, take it as soon as you can. If it is almost time for your next dose, take only that dose. Do not  take double or extra doses. What may interact with this medicine? This medicine may interact with the following medications: -carbamazepine -colesevelam -probenecid -sevelamer This list may not describe all possible interactions. Give your health care provider a list of all the medicines, herbs, non-prescription drugs, or dietary supplements you use. Also tell them if you smoke, drink alcohol, or use illegal drugs. Some items may interact with your medicine. What should I watch for while using this medicine? Visit your doctor or health care professional for a regular check on your progress. Wear a medical identification bracelet or chain to say you have epilepsy, and carry a card that lists all your medications. It is important to take this medicine exactly as instructed by your health care professional. When first starting treatment, your dose may need to be adjusted. It may take weeks or months before your dose is stable. You should contact your doctor or health care professional if your seizures get worse or if you have any new types of seizures. You may get drowsy or dizzy. Do not drive, use machinery, or do anything that needs mental alertness until you know how this medicine affects you. Do not stand or sit up quickly, especially if you are an older patient. This reduces the risk of dizzy or fainting spells. Alcohol may interfere with the effect of this medicine. Avoid alcoholic drinks. The use of this medicine may increase the chance of suicidal thoughts or actions. Pay special attention to how you are responding while on this medicine. Any worsening of mood, or thoughts of suicide or dying  should be reported to your health care professional right away. Women who become pregnant while using this medicine may enroll in the [place] Pregnancy Registry by calling [phone]. This registry collects information about the safety of antiepileptic drug use during  pregnancy. What side effects may I notice from receiving this medicine? Side effects you should report to your doctor or health care professional as soon as possible: -allergic reactions like skin rash, itching or hives, swelling of the face, lips, or tongue -breathing problems -dark urine -general ill feeling or flu-like symptoms -problems with balance, talking, walking -unusually weak or tired -worsening of mood, thoughts or actions of suicide or dying -yellowing of the eyes or skin Side effects that usually do not require medical attention (report to your doctor or health care professional if they continue or are bothersome): -diarrhea -dizzy, drowsy -headache -loss of appetite This list may not describe all possible side effects. Call your doctor for medical advice about side effects. You may report side effects to FDA at 1-800-FDA-1088. Where should I keep my medicine? Keep out of reach of children. Store at room temperature between 15 and 30 degrees C (59 and 86 degrees F). Throw away any unused medicine after the expiration date. NOTE: This sheet is a summary. It may not cover all possible information. If you have questions about this medicine, talk to your doctor, pharmacist, or health care provider.    2016, Elsevier/Gold Standard. (2013-04-03 08:42:48) Seizure, Adult A seizure means there is unusual activity in the brain. A seizure can cause changes in attention or behavior. Seizures often cause shaking (convulsions). Seizures often last from 30 seconds to 2 minutes. HOME CARE   If you are given medicines, take them exactly as told by your doctor.  Keep all doctor visits as told.  Do not swim or drive until your doctor says it is okay.  Teach others what to do if you have a seizure. They should:  Lay you on the ground.  Put a cushion under your head.  Loosen any tight clothing around your neck.  Turn you on your side.  Stay with you until you get better. GET  HELP RIGHT AWAY IF:   The seizure lasts longer than 2 to 5 minutes.  The seizure is very bad.  The person does not wake up after the seizure.  The person's attention or behavior changes. Drive the person to the emergency room or call your local emergency services (911 in [place] MAKE SURE YOU:   Understand these instructions.  Will watch your condition.  Will get help right away if you are not doing well or get worse.   This information is not intended to replace advice given to you by your health care provider. Make sure you discuss any questions you have with your health care provider.   Document Released: 10/27/2007 Document Revised: 08/02/2011 Document Reviewed: 12/20/2012 Elsevier Interactive Patient Education 2016 [facility] at [phone]

## 2016-03-27 NOTE — Progress Notes (Signed)
  Echocardiogram 2D Echocardiogram has been performed.  [name] 03/27/2016, 5:16 PM

## 2016-03-27 NOTE — Progress Notes (Signed)
Pt d/c to home by car with family. Assessment stable. Prescriptions given. All questions answered. 

## 2016-03-27 NOTE — Discharge Summary (Signed)
Physician Discharge Summary  Candice Gray [id number] DOB: [birth year] DOA: 03/25/2016  PCP: Eliezer Lofts, MD  Admit date: 03/25/2016 Discharge date: 03/27/2016  Recommendations for Outpatient Follow-up:  1. Pt will need to follow up with PCP in 2-3 weeks post discharge 2. Please obtain BMP to evaluate electrolytes and kidney function, Na level, Mg and K level  Discharge Diagnoses:  Active Problems:   HYPERTENSION, BENIGN ESSENTIAL, LABILE   SIADH (syndrome of inappropriate ADH production) ([place])   Syncope  Discharge Condition: Stable  Diet recommendation: Heart healthy diet discussed in details   History of present illness:  60 y.o. 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 Presenting after single episode of syncope. She had had coffee that morning but nothing else to eat. Patient reports feeling somewhat flushed and dizzy just prior to episode but denies palpitations or vertigo. Patient does not remember the actual syncopal event.   Hospital Course:  1. Syncope with questionable stroke/TIA/seizure - EEG suspicious for seizure, MRI with no signs of stroke  - Keppra recommended by Neuro team, will provide script - pt wants to be discharged today, neuro team cleared for d/c  2. Hypertension-uncontrolled.   - continue home medical regimen   3. HLD - 03/05/2016: Cholesterol 249; HDL 144.10; LDL Cholesterol 94; Triglycerides 52.0; VLDL 10.4 - plan to start statun   4. Elevated blood sugar - DM ruled out, A1C 5.5  5. Hyponatremia, hypokalemia, hypomagnesemia-history of SIADH. Baseline sodium around 130, Na now at baseline, Supplemented Mg and K prior to discharge, will need close outpatient follow up   6. Leukocytosis-not sure of this, likely reactive, no signs of infection,  Code Status:  Full code  Procedures/Studies: Ct Angio Head W Or Wo  Contrast  Result Date: 03/25/2016 CLINICAL DATA:  Code stroke. Right-sided weakness and slurred speech. EXAM: CT ANGIOGRAPHY HEAD AND NECK TECHNIQUE: Multidetector CT imaging of the head and neck was performed using the standard protocol during bolus administration of intravenous contrast. Multiplanar CT image reconstructions and MIPs were obtained to evaluate the vascular anatomy. Carotid stenosis measurements (when applicable) are obtained utilizing NASCET criteria, using the distal internal carotid diameter as the denominator. CONTRAST:  50 cc Isovue 370 COMPARISON:  Head CT same day. FINDINGS: CTA NECK FINDINGS Aortic arch: Some atherosclerosis of the aortic arch. No dissection. No brachiocephalic vessel origin stenosis. Right carotid system: Common carotid artery widely patent to the bifurcation. Atherosclerosis at the carotid bifurcation without stenosis or significant irregularity. Cervical internal carotid artery is normal. Left carotid system: Common carotid artery widely patent to the bifurcation. Atherosclerotic disease affecting the ICA bulb with some regularity but no stenosis compared to the more distal cervical ICA. Vertebral arteries: Both vertebral artery origins widely patent. Both vertebral arteries widely patent through the cervical region. Skeleton: Negative Other neck: Negative Upper chest: Biapical pleural and parenchymal scarring. Review of the MIP images confirms the above findings CTA HEAD FINDINGS Anterior circulation: Both internal carotid artery is widely patent through the skullbase and siphon regions. The anterior and middle cerebral vessels are patent without proximal stenosis, aneurysm or vascular malformation. Fetal origin PCAs. Posterior circulation: Both vertebral arteries widely patent to the basilar. No basilar stenosis. Posterior circulation branch vessels are normal. Venous sinuses: Patent and normal. Anatomic variants: None significant Delayed phase: No abnormal enhancement  Review of the MIP images confirms the above findings IMPRESSION: Some atherosclerotic disease at both carotid bifurcations. Mild irregularity of the proximal  ICA on the left, but no measurable stenosis. Two Intracranial vessels are negative.  No large vessel occlusion. These results were called by telephone at the time of interpretation on 03/25/2016 at 11:08 am to Dr. Norman Clay , who verbally acknowledged these results. Electronically Signed   By: Nelson Chimes M.D.   On: 03/25/2016 11:10   X-ray Chest Pa And Lateral  Result Date: 03/25/2016 CLINICAL DATA:  Syncope. EXAM: CHEST  2 VIEW COMPARISON:  08/11/2013. FINDINGS: Normal sized heart. Clear lungs. The lungs are hyperexpanded with mildly prominent interstitial markings. Diffuse osteopenia. IMPRESSION: No acute abnormality.  COPD. Electronically Signed   By: Claudie Revering M.D.   On: 03/25/2016 15:30   Ct Angio Neck W Or Wo Contrast  Result Date: 03/25/2016 CLINICAL DATA:  Code stroke. Right-sided weakness and slurred speech. EXAM: CT ANGIOGRAPHY HEAD AND NECK TECHNIQUE: Multidetector CT imaging of the head and neck was performed using the standard protocol during bolus administration of intravenous contrast. Multiplanar CT image reconstructions and MIPs were obtained to evaluate the vascular anatomy. Carotid stenosis measurements (when applicable) are obtained utilizing NASCET criteria, using the distal internal carotid diameter as the denominator. CONTRAST:  50 cc Isovue 370 COMPARISON:  Head CT same day. FINDINGS: CTA NECK FINDINGS Aortic arch: Some atherosclerosis of the aortic arch. No dissection. No brachiocephalic vessel origin stenosis. Right carotid system: Common carotid artery widely patent to the bifurcation. Atherosclerosis at the carotid bifurcation without stenosis or significant irregularity. Cervical internal carotid artery is normal. Left carotid system: Common carotid artery widely patent to the bifurcation. Atherosclerotic  disease affecting the ICA bulb with some regularity but no stenosis compared to the more distal cervical ICA. Vertebral arteries: Both vertebral artery origins widely patent. Both vertebral arteries widely patent through the cervical region. Skeleton: Negative Other neck: Negative Upper chest: Biapical pleural and parenchymal scarring. Review of the MIP images confirms the above findings CTA HEAD FINDINGS Anterior circulation: Both internal carotid artery is widely patent through the skullbase and siphon regions. The anterior and middle cerebral vessels are patent without proximal stenosis, aneurysm or vascular malformation. Fetal origin PCAs. Posterior circulation: Both vertebral arteries widely patent to the basilar. No basilar stenosis. Posterior circulation branch vessels are normal. Venous sinuses: Patent and normal. Anatomic variants: None significant Delayed phase: No abnormal enhancement Review of the MIP images confirms the above findings IMPRESSION: Some atherosclerotic disease at both carotid bifurcations. Mild irregularity of the proximal ICA on the left, but no measurable stenosis. Two Intracranial vessels are negative.  No large vessel occlusion. These results were called by telephone at the time of interpretation on 03/25/2016 at 11:08 am to Dr. Norman Clay , who verbally acknowledged these results. Electronically Signed   By: Nelson Chimes M.D.   On: 03/25/2016 11:10   Mr Brain Wo Contrast  Result Date: 03/25/2016 CLINICAL DATA:  60 year old hypertensive female with a single syncopal episode. Mild confusion and slurred speech. 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 Subsequent encounter. EXAM: MRI HEAD WITHOUT CONTRAST TECHNIQUE: Multiplanar, multiecho pulse sequences of the brain and surrounding structures were obtained without intravenous contrast. COMPARISON:  CT and CT angiogram 03/25/2016.  No comparison MR. FINDINGS: Brain: No acute infarct or intracranial hemorrhage. Mild global atrophy  without hydrocephalus. No intracranial mass lesion noted on this unenhanced exam. No mass seen along the infundibulum.  No pituitary abnormality. No evidence of mesial temporal sclerosis. Vascular: Major intracranial vascular structures are patent. Skull and upper cervical spine: Negative Sinuses/Orbits: No acute orbital abnormality. Minimal mucosal thickening  ethmoid sinus air cells. Other: Negative. IMPRESSION: No acute infarct. Global atrophy. Electronically Signed   By: Genia Del M.D.   On: 03/25/2016 18:11   Dg Chest Port 1 View  Result Date: 03/26/2016 CLINICAL DATA:  Leukocytosis.  Question aspiration. EXAM: PORTABLE CHEST 1 VIEW COMPARISON:  03/25/2016 FINDINGS: There is hyperinflation of the lungs compatible with COPD. Heart and mediastinal contours are within normal limits. No focal opacities or effusions. No acute bony abnormality. IMPRESSION: COPD.  No active disease. Electronically Signed   By: Rolm Baptise M.D.   On: 03/26/2016 11:59   Ct Head Code Stroke W/o Cm  Result Date: 03/25/2016 CLINICAL DATA:  Code stroke. 60 year old female with slurred speech and right-sided weakness. Initial encounter. EXAM: CT HEAD WITHOUT CONTRAST TECHNIQUE: Contiguous axial images were obtained from the base of the skull through the vertex without intravenous contrast. COMPARISON:  08/11/2013. FINDINGS: Brain: Left subinsular subtle hypodensity. Question evidence of acute infarct. No intracranial hemorrhage. Global atrophy without hydrocephalus. No intracranial mass lesion noted on this unenhanced exam. Vascular: Question subtle hyperdensity left middle cerebral artery as can be seen with thrombus. Vascular calcifications. Skull: No acute abnormality. Sinuses/Orbits: No acute orbital abnormality. Visualized sinuses and mastoid air cells are clear. Other: Negative. ASPECTS [facility] Stroke Program Early CT Score) - Ganglionic level infarction (caudate, lentiform nuclei, internal capsule, insula, M1-M3 cortex):  6 - Supraganglionic infarction (M4-M6 cortex): 3 Total score (0-10 with 10 being normal): 9 IMPRESSION: 1. Question acute left subinsular infarct/middle cerebral artery distribution infarct. Question subtle hyperdensity left middle cerebral artery as can be seen with thrombus. Global atrophy. 2. ASPECTS is 9 These results were called by telephone at the time of interpretation on 03/25/2016 at 10:55 am to Dr. Shirlyn Goltz , who verbally acknowledged these results. Electronically Signed   By: Genia Del M.D.   On: 03/25/2016 10:57     Discharge Exam: Vitals:   03/27/16 1021 03/27/16 1344  BP: 123/80 130/83  Pulse: 70 84  Resp: 18 18  Temp: 98.4 F (36.9 C) 98.8 F (37.1 C)   Vitals:   03/27/16 0100 03/27/16 0500 03/27/16 1021 03/27/16 1344  BP: 125/72 112/75 123/80 130/83  Pulse: 78 (!) 107 70 84  Resp: 17 17 18 18   Temp: 99.2 F (37.3 C) 98.6 F (37 C) 98.4 F (36.9 C) 98.8 F (37.1 C)  TempSrc: Oral Oral Oral Oral  SpO2: 100% 100% 100% 100%  Weight:      Height:        General: Pt is alert, follows commands appropriately, not in acute distress Cardiovascular: Regular rate and rhythm, S1/S2 +, no murmurs, no rubs, no gallops Respiratory: Clear to auscultation bilaterally, no wheezing, no crackles, no rhonchi Abdominal: Soft, non tender, non distended, bowel sounds +, no guarding  Discharge Instructions  Discharge Instructions    Diet - low sodium heart healthy    Complete by:  As directed    Diet - low sodium heart healthy    Complete by:  As directed    Increase activity slowly    Complete by:  As directed    Increase activity slowly    Complete by:  As directed        Medication List    TAKE these medications   amLODipine 2.5 MG tablet Commonly known as:  NORVASC Take 1 tablet (2.5 mg total) by mouth 2 (two) times daily.   Biotin 1 MG Caps Take 1 tablet by mouth daily.   CALTRATE 600 PLUS-VIT  D PO Take 1 tablet by mouth daily.   ibandronate 150 MG  tablet Commonly known as:  BONIVA Take 150 mg by mouth every 30 (thirty) days. Take in the morning with a full glass of water, on an empty stomach, and do not take anything else by mouth or lie down for the next 30 min.   latanoprost 0.005 % ophthalmic solution Commonly known as:  XALATAN Place 1 drop into both eyes at bedtime.   levETIRAcetam 500 MG tablet Commonly known as:  KEPPRA Take 1 tablet (500 mg total) by mouth 2 (two) times daily.   metoprolol tartrate 25 MG tablet Commonly known as:  LOPRESSOR Take 0.5 tablets (12.5 mg total) by mouth 2 (two) times daily.   triamcinolone cream 0.5 % Commonly known as:  KENALOG Apply 1 application topically 2 (two) times daily.   vitamin C 500 MG tablet Commonly known as:  ASCORBIC ACID Take 500 mg by mouth daily.   VITAMIN D-3 PO Take 1 capsule by mouth daily.      Follow-up Information    Eliezer Lofts, MD .   Specialty:  Family Medicine Contact information: [address] [place] [address] [phone]            The results of significant diagnostics from this hospitalization (including imaging, microbiology, ancillary and laboratory) are listed below for reference.     Microbiology: No results found for this or any previous visit (from the past 240 hour(s)).   Labs: Basic Metabolic Panel:  Recent Labs Lab 03/25/16 1039 03/25/16 1041 03/25/16 1529 03/26/16 0332 03/26/16 1004 03/27/16 0336  NA 129* 130*  --  129*  --  129*  K 3.3* 3.4*  --  3.2*  --  3.2*  CL 96* 97*  --  91*  --  90*  CO2 19*  --   --  23  --  29  GLUCOSE 239* 251*  --  165*  --  118*  BUN 8 9  --  7  --  10  CREATININE 0.80 0.70  --  0.69  --  0.78  CALCIUM 10.1  --   --  10.6*  --  9.8  MG  --   --  1.8  --  1.6*  --   PHOS  --   --  2.1*  --   --   --    Liver Function Tests:  Recent Labs Lab 03/25/16 1039 03/26/16 0332 03/27/16 0336  AST 28 33 33  ALT 13* 15 18  ALKPHOS 52 64 52  BILITOT 0.6 0.7 0.8  PROT  6.9 7.9 6.6  ALBUMIN 3.9 4.6 3.5   CBC:  Recent Labs Lab 03/25/16 1039 03/25/16 1041 03/26/16 0332 03/26/16 1004 03/27/16 0336  WBC 14.3*  --  22.2*  --  13.0*  NEUTROABS 8.0*  --   --  15.3* 8.9*  HGB 13.6 14.6 16.0*  --  15.1*  HCT 39.9 43.0 45.0  --  43.8  MCV 99.5  --  96.6  --  97.8  PLT 253  --  287  --  237    BNP (last 3 results)  Recent Labs  03/25/16 1529  BNP 112.5*   CBG:  Recent Labs Lab 03/26/16 1219 03/26/16 1746 03/26/16 2059 03/27/16 0634 03/27/16 1117  GLUCAP 163* 182* 119* 118* 103*   SIGNED: Time coordinating discharge: 30 minutes  MAGICK-Skii Cleland, MD  [facility] 03/27/2016, 2:50 PM Pager [phone]  If 7PM-7AM, please  contact night-coverage [email or web address] Password [email or web address]

## 2016-03-27 NOTE — Progress Notes (Addendum)
STROKE TEAM PROGRESS NOTE   HISTORY OF PRESENT ILLNESS (per record) Candice Gray is an 60 y.o. female with hx as below was found by her son unresponsive at the table this am, upon awakening she was mute and weaker on the right side. By the time she came to the ER she was speaking, moving all extremities equally with mild confusion to what happened. She was last known well: 03/25/16 at  945am. Patient was not administered IV t-PA. She was admitted for further evaluation and treatment.   SUBJECTIVE (INTERVAL HISTORY) Her family was not at the bedside.  Overall she feels her condition is completely resolved.  I encouraged her to discuss the need for long-term AED with outpatient neurology.  Patient should not drive until cleared by Neurology.   OBJECTIVE Temp:  [98.2 F (36.8 C)-99.5 F (37.5 C)] 98.8 F (37.1 C) (11/04 1344) Pulse Rate:  [70-107] 84 (11/04 1344) Cardiac Rhythm: Normal sinus rhythm (11/04 0700) Resp:  [17-18] 18 (11/04 1344) BP: (109-130)/(72-83) 130/83 (11/04 1344) SpO2:  [99 %-100 %] 100 % (11/04 1344)  CBC:   Recent Labs Lab 03/26/16 0332 03/26/16 1004 03/27/16 0336  WBC 22.2*  --  13.0*  NEUTROABS  --  15.3* 8.9*  HGB 16.0*  --  15.1*  HCT 45.0  --  43.8  MCV 96.6  --  97.8  PLT 287  --  [id number]    Basic Metabolic Panel:   Recent Labs Lab 03/25/16 1529 03/26/16 0332 03/26/16 1004 03/27/16 0336  NA  --  129*  --  129*  K  --  3.2*  --  3.2*  CL  --  91*  --  90*  CO2  --  23  --  29  GLUCOSE  --  165*  --  118*  BUN  --  7  --  10  CREATININE  --  0.69  --  0.78  CALCIUM  --  10.6*  --  9.8  MG 1.8  --  1.6*  --   PHOS 2.1*  --   --   --     Lipid Panel:     Component Value Date/Time   CHOL 298 (H) 03/26/2016 0332   TRIG 66 03/26/2016 0332   HDL 177 03/26/2016 0332   CHOLHDL 1.7 03/26/2016 0332   VLDL 13 03/26/2016 0332   LDLCALC 108 (H) 03/26/2016 0332   HgbA1c:  Lab Results  Component Value Date   HGBA1C 5.5 03/26/2016   Urine  Drug Screen:     Component Value Date/Time   LABOPIA NONE DETECTED 03/25/2016 1207   COCAINSCRNUR NONE DETECTED 03/25/2016 1207   LABBENZ NONE DETECTED 03/25/2016 1207   AMPHETMU NONE DETECTED 03/25/2016 1207   THCU POSITIVE (A) 03/25/2016 1207   LABBARB NONE DETECTED 03/25/2016 1207      IMAGING  Ct Angio Head W Or Wo Contrast Ct Angio Neck W Or Wo Contrast 03/25/2016 Some atherosclerotic disease at both carotid bifurcations. Mild irregularity of the proximal ICA on the left, but no measurable stenosis. Two Intracranial vessels are negative.  No large vessel occlusion.   Mr Brain Wo Contrast 05/26/2015 No acute infarct. Global atrophy.   Ct Head Code Stroke W/o Cm 03/25/2016 1. Question acute left subinsular infarct/middle cerebral artery distribution infarct. Question subtle hyperdensity left middle cerebral artery as can be seen with thrombus. Global atrophy. 2. ASPECTS is 9    PHYSICAL EXAM HEENT-  Normocephalic, no lesions, without obvious abnormality.  Normal external  eye and conjunctiva.  Normal TM's bilaterally.  Normal auditory canals and external ears. Normal external nose, mucus membranes and septum.  Normal pharynx. Cardiovascular- regular rate and rhythm, S1, S2 normal, no murmur, click, rub or gallop, pulses palpable throughout   Lungs- chest clear, no wheezing, rales, normal symmetric air entry, Heart exam - S1, S2 normal, no murmur, no gallop, rate regular Abdomen- soft, non-tender; bowel sounds normal; no masses,  no organomegaly   Neurological Examination Mental Status: Alert, oriented, thought content appropriate.  Speech fluent without evidence of aphasia.  Able to follow 3 step commands without difficulty. Cranial Nerves: [id number] fields grossly normal, pupils equal, round, reactive to light and accommodation III,IV, VI: ptosis not present, extra-ocular motions intact bilaterally V,VII: smile symmetric, facial light touch sensation normal  bilaterally VIII: hearing normal bilaterally IX,X: uvula rises symmetrically XI: bilateral shoulder shrug XII: midline tongue extension Motor: Right :  Upper extremity   5/5  Mild diminished fine motor movement in her R hand                         Left:     Upper extremity   5/5             Lower extremity   5/5                                                  Lower extremity   5/5 Tone and bulk:normal tone throughout; no atrophy noted Sensory: Pinprick and light touch intact throughout, bilaterally  Cerebellar: normal finger-to-nose  ASSESSMENT/PLAN Candice Gray is a 60 y.o. 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 She did not receive IV t-PA.   Possible TIA secondary to small vessel disease vs seizure.   Resultant  Neuro deficits resolved   MRI  No acute stroke  CTA H&N  Diffuse atherosclerosis  2D Echo  pending   EEG: abnormal EEG with evidence of a seizure tendency emanating from the left anterior to mid temporal lobe, putting the patient at risk of focal seizures with or without secondary generalization.     LDL 108  HgbA1c 5.5  Lovenox 40 mg sq daily for VTE prophylaxis Diet Heart Room service appropriate? Yes; Fluid consistency: Thin Diet regular Room service appropriate? Yes; Fluid consistency: Thin Diet - low sodium heart healthy Diet - low sodium heart healthy  No antithrombotic prior to admission, now on aspirin 325 mg daily  Ongoing aggressive stroke risk factor management  Therapy recommendations:  pending   Disposition:  pending   Hypertensive Emergency  BP as high as 2000/111 on arrival in setting of neurologic symptoms  Remains elevated this am  Hyperlipidemia  Home meds:  No statin  LDL 108, goal < 70 for stroke  Consider addition of  statin  Hyperglycemia  HgbA1c 5.5  Other Stroke Risk Factors  Former Cigarette smoker  THC use positive this admission  ETOH use, advised to drink no more than 1 drink(s) a day  Other Active Problems  SIADH  Raynaud's syndrome  Syncope  Leukocytosis  MVP   Possible seizures - Discharged on [facility] day # 0   I have personally examined this patient,  reviewed notes, independently viewed imaging studies, participated in medical decision making and plan of care.ROS completed by me personally and pertinent positives fully documented  I have made any additions or clarifications directly to the above note. Agree with note above. Patient was clinical presentation unresponsiveness with some extremity jerking followed by some focal right-sided transient weakness sounds more consistent with seizure activity. Abnormal EEG supports this further. MRI shows no definite stroke. Recommend:  Keppra 500 mg twice daily  Recommended no driving until cleared by Neurology  Sodium and electrolytes will need to be followed closely by PCP  Greater than 50% time during this 35 minute visit was spent on counseling and coordination of care about TIA, seizure and answering questions.  To contact Stroke Continuity provider, please refer to [email or web address] After hours, contact General Neurology

## 2016-03-27 NOTE — Progress Notes (Signed)
Dr Blenda Mounts note reviewed, patient admitted with syncope. Abnormal EEG, she is being worked up by neuro for seizures. Does not appear to be a cardiac etiology of her syncope. We will f/u resulst of echo. No further cardiac testing planned this admission, call over the weekend with questions.  Zandra Abts MD

## 2016-03-28 LAB — ECHOCARDIOGRAM COMPLETE
CHL CUP TV REG PEAK VELOCITY: 197 cm/s
E/e' ratio: 7
EWDT: 215 ms
FS: 25 % — AB (ref 28–44)
HEIGHTINCHES: 68 in
IV/PV OW: 0.98
LA diam index: 1.29 cm/m2
LA vol: 27.5 mL
LASIZE: 22 mm
LAVOLA4C: 22.2 mL
LAVOLIN: 16.1 mL/m2
LEFT ATRIUM END SYS DIAM: 22 mm
LV E/e' medial: 7
LV PW d: 9.63 mm — AB (ref 0.6–1.1)
LV TDI E'LATERAL: 8.38
LV TDI E'MEDIAL: 4.24
LV e' LATERAL: 8.38 cm/s
LVEEAVG: 7
LVOT area: 2.27 cm2
LVOTD: 17 mm
Lateral S' vel: 16.6 cm/s
MV Dec: 215
MV pk A vel: 82.5 m/s
MV pk E vel: 58.7 m/s
RV sys press: 19 mmHg
TAPSE: 24.2 mm
TR max vel: 197 cm/s
WEIGHTICAEL: 2096 [oz_av]

## 2016-03-29 ENCOUNTER — Telehealth: Payer: Self-pay

## 2016-03-29 ENCOUNTER — Telehealth: Payer: Self-pay | Admitting: Internal Medicine

## 2016-03-29 NOTE — Telephone Encounter (Signed)
Pt was admitted to [facility] on 03/25/16 and d/c on 03/27/16 due to seizure; pt has appt to see Dr Krista Blue on 04/01/16 and pt scheduled 30 min hospital f/u on 04/13/16 at 10:45. Pt wanted Dr Diona Browner to be aware of what had been going on and pt will cb prior to appt if needed. FYI to Dr Diona Browner.

## 2016-03-29 NOTE — Telephone Encounter (Signed)
Patient wanted you to know she had a medical emergency last Thursday----she passed out and had a seizure.  She was taken to the ED and was seen by Dr. Oval Linsey.  SHe spoke with Dr. Oval Linsey and states if she needs follow up in the future she would like to see her.  Dr. Abram Sander agreeable to this.  Patient states when she last saw yu, you told to come back as needed.

## 2016-03-29 NOTE — Telephone Encounter (Signed)
Unable to reach patient, no answer when dialed. I spoke w Candice Gray who noted patient called more as FYI only - no acute complaints. As noted below, patient has seen Dr. Debara Pickett in past, last recommended to follow up on as-needed basis. Dr. Oval Linsey was cardiologist on service during patient's ED visit. Wanted to request provider change to see Dr. Oval Linsey. Routed to Dr. Debara Pickett.

## 2016-03-30 NOTE — Telephone Encounter (Signed)
Noted. Reviewed hospital notes, Will follow.

## 2016-04-01 ENCOUNTER — Ambulatory Visit (INDEPENDENT_AMBULATORY_CARE_PROVIDER_SITE_OTHER): Payer: BLUE CROSS/BLUE SHIELD | Admitting: Neurology

## 2016-04-01 ENCOUNTER — Encounter: Payer: Self-pay | Admitting: Neurology

## 2016-04-01 VITALS — BP 143/89 | HR 70 | Ht 68.0 in | Wt 124.0 lb

## 2016-04-01 DIAGNOSIS — E222 Syndrome of inappropriate secretion of antidiuretic hormone: Secondary | ICD-10-CM | POA: Diagnosis not present

## 2016-04-01 DIAGNOSIS — F1290 Cannabis use, unspecified, uncomplicated: Secondary | ICD-10-CM

## 2016-04-01 DIAGNOSIS — R569 Unspecified convulsions: Secondary | ICD-10-CM | POA: Diagnosis not present

## 2016-04-01 MED ORDER — LAMOTRIGINE 25 MG PO TABS: ORAL_TABLET | ORAL | 0 refills | Status: DC

## 2016-04-01 MED ORDER — LAMOTRIGINE 100 MG PO TABS: 100.0000 mg | ORAL_TABLET | Freq: Two times a day (BID) | ORAL | 11 refills | Status: DC

## 2016-04-01 NOTE — Progress Notes (Signed)
PATIENT: Candice Gray DOB: 1956-02-22  Chief Complaint  Patient presents with  . Rm 5    Husband, Micheal  . New Patient (Initial Visit)    Hospital follow-up  . Loss of Consciousness    TIA vs seizure     HISTORICAL  Candice Gray is a 60 years old right-handed female, seen in refer by her primary care doctor  Jinny Sanders for evaluation of seizure on March 25 2016, initial evaluation was on April 01 2016.  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  She was put on water restriction 64 ounces daily ever since, is under close supervision of endocrinologist, she reported frequent thirsty recently, has a bird her water intake some,  On March 25 2016 around 10:00 in the morning, she noticed mild dizziness, sat down in the chair, lost consciousness, woke up on ambulance confused, she has a witnessed tonic-clonic seizure activity, her body becoming rigid, mouth open, eyes rolled back, lasting for a few minutes, followed by post event confusion.    I personally reviewed MRI of the brain on March 25 2016, mild generalized atrophy no acute abnormality   I reviewed her laboratory evaluation in November, glucose was 123, sodium was 129 normal, potassium was 3.2, chloride was 90, glucose was 118, creatinine 0.78, WBC was mildly elevated 13, hemoglobin was 15 point 1, magnesium 1.6, A1c was 5.5, normal TSH, BNP was 112, magnesium 1.8, UDS was positive for marijuana, alcohol was less than 5, negative hepatitis C,   EEG showed: posterior dominant rhythm of 9 Hz reactive eye opening and closure., there are epileptiform discharges per description with maximum at F7 and T3. These do not evolve into electrographic seizures.  No sleep was recorded.    She was treated with Keppra 500 mg 3 times a day, she complains of being  anxious about her health, get irritated easily, she does smoke marijuana regularly, now she has stopped smoking.  REVIEW OF SYSTEMS: Full 14 system review of systems performed and notable only for fatigue, ringing ears, anxiety, disinterested in activities  ALLERGIES: Allergies  Allergen Reactions  . Azelastine Hcl     REACTION: increased bp  . Miralax [Polyethylene Glycol] Itching  . Red Dye Swelling  . Zyban [Bupropion] Swelling    HOME MEDICATIONS: Current Outpatient Prescriptions  Medication Sig Dispense Refill  . amLODipine (NORVASC) 2.5 MG tablet Take 1 tablet (2.5 mg total) by mouth 2 (two) times daily. 180 tablet 3  . Biotin 1 MG CAPS Take 1 tablet by mouth daily.    . Calcium-Vitamin D (CALTRATE 600 PLUS-VIT D PO) Take 1 tablet by mouth daily.    . Cholecalciferol (VITAMIN D-3 PO) Take 1 capsule by mouth daily.    Marland Kitchen ibandronate (BONIVA) 150 MG tablet Take 150 mg by mouth every 30 (thirty) days. Take in the morning with a full glass of water, on an empty stomach, and do not take anything else by mouth or lie down for the next 30 min.    . latanoprost (XALATAN) 0.005 % ophthalmic solution Place 1 drop into both eyes at bedtime.    . levETIRAcetam (KEPPRA) 500 MG tablet Take 1 tablet (500 mg total) by mouth 2 (two) times daily. 60 tablet 0  . metoprolol tartrate (LOPRESSOR) 25 MG tablet Take 0.5 tablets (12.5 mg total)  by mouth 2 (two) times daily. 60 tablet 0  . triamcinolone cream (KENALOG) 0.5 % Apply 1 application topically 2 (two) times daily. 30 g 0  . vitamin C (ASCORBIC ACID) 500 MG tablet Take 500 mg by mouth daily.     No current facility-administered medications for this visit.     PAST MEDICAL HISTORY: Past Medical History:  Diagnosis Date  . Anxiety   . Deviated nasal septum   . Diverticulitis   . Diverticulosis   . HTN (hypertension)   . Hyperlipemia   . Hyperplastic colon polyp   . Hyponatremia   . Hyponatremia   . MVP (mitral valve prolapse)   .  Raynaud phenomenon   . SIADH (syndrome of inappropriate ADH production) ([place])     PAST SURGICAL HISTORY: Past Surgical History:  Procedure Laterality Date  . ABDOMINAL HYSTERECTOMY    . CRYOTHERAPY     for cervical dysplasia  . NM MYOCAR PERF WALL MOTION  11/2007   bruce myoview; perfusion defect in anterior myocardium (breast attenuation); post-stress EF 77%; normal, low risk study   . TRANSTHORACIC ECHOCARDIOGRAM  11/2007   EF=>55%; mild MR; trace TR  . TUBAL LIGATION      FAMILY HISTORY: Family History  Problem Relation Age of Onset  . Hyperlipidemia Father   . Hypertension Father   . Diabetes Father   . Atrial fibrillation Father   . Diabetes Paternal Grandmother   . Cancer Paternal Grandfather     brain tumor  . Hyperlipidemia Other   . Hypertension Other   . Coronary artery disease Maternal Grandmother   . Diabetes Maternal Grandmother   . Hyperlipidemia Brother   . Transient ischemic attack Mother   . Colon cancer Neg Hx     SOCIAL HISTORY:  Social History   Social History  . Marital status: Married    Spouse name: N/A  . Number of children: 2  . Years of education: College   Occupational History  . 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 Smoking status: Former Smoker    Types: Cigarettes    Quit date: 06/05/1998  . Smokeless tobacco: Never Used  . Alcohol use 8.4 oz/week    14 Cans of beer per week     Comment: daily  . Drug use:     Types: Marijuana     Comment: remote  . Sexual activity: Not on file   Other Topics Concern  . Not on file   Social History Narrative   Lives at husband and 2 sons   Right-handed   Regular exercise--yes   Caffeine: daily coffee   Diet: fruits and veggies        PHYSICAL EXAM   Vitals:   04/01/16 0943  BP: (!) 143/89  Pulse: 70  Weight: 124 lb (56.2 kg)  Height: 5\' 8"  (1.727 m)    Not recorded      Body mass index is 18.85 kg/m.  PHYSICAL  EXAMNIATION:  Gen: NAD, conversant, well nourised, obese, well groomed                     Cardiovascular: Regular rate rhythm, no peripheral edema, warm, nontender. Eyes: Conjunctivae clear without exudates or hemorrhage Neck: Supple, no carotid bruits. Pulmonary: Clear to auscultation bilaterally   NEUROLOGICAL EXAM:  MENTAL STATUS: Speech:    Speech is normal; fluent and spontaneous with normal comprehension.  Cognition:     Orientation to  time, place and person     Normal recent and remote memory     Normal Attention span and concentration     Normal Language, naming, repeating,spontaneous speech     Fund of knowledge   CRANIAL NERVES: CN II: Visual fields are full to confrontation. Fundoscopic exam is normal with sharp discs and no vascular changes. Pupils are round equal and briskly reactive to light. CN III, IV, VI: extraocular movement are normal. No ptosis. CN V: Facial sensation is intact to pinprick in all 3 divisions bilaterally. Corneal responses are intact.  CN VII: Face is symmetric with normal eye closure and smile. CN VIII: Hearing is normal to rubbing fingers CN IX, X: Palate elevates symmetrically. Phonation is normal. CN XI: Head turning and shoulder shrug are intact CN XII: Tongue is midline with normal movements and no atrophy.  MOTOR: There is no pronator drift of out-stretched arms. Muscle bulk and tone are normal. Muscle strength is normal.  REFLEXES: Reflexes are 2+ and symmetric at the biceps, triceps, knees, and ankles. Plantar responses are flexor.  SENSORY: Intact to light touch, pinprick, positional sensation and vibratory sensation are intact in fingers and toes.  COORDINATION: Rapid alternating movements and fine finger movements are intact. There is no dysmetria on finger-to-nose and heel-knee-shin.    GAIT/STANCE: Posture is normal. Gait is steady with normal steps, base, arm swing, and turning. Heel and toe walking are normal. Tandem gait  is normal.  Romberg is absent.   DIAGNOSTIC DATA (LABS, IMAGING, TESTING) - I reviewed patient records, labs, notes, testing and imaging myself where available.   ASSESSMENT AND PLAN  COLIE BAKEMAN is a 60 y.o. female   Seizure on March 25 2016 MRI of the brain showed generalized atrophy EEG showed focal abnormality at anterior temporal lobe, She could not tolerate Keppra, complains of moodiness, anxiety, Will change to lamotrigine titrating to 100 mg twice a day, No driving to seizure free for 6 months   Marcial Pacas, M.D. Ph.D.  [facility] [address], [place], [place] [address] Ph: [phone] Fax: [phone]  CC: Jinny Sanders, MD

## 2016-04-01 NOTE — Patient Instructions (Signed)
Taper off keppra, add on Lamotrigine.      Keppra    Lamotrigine  1st week   500/500   25mg /25 2nd week   0/500    2/2 3rd week   0/0    3/3 4th week   0/0    4/4  Lamotrigine 100mg  twice a day

## 2016-04-06 DIAGNOSIS — E876 Hypokalemia: Secondary | ICD-10-CM | POA: Diagnosis not present

## 2016-04-06 DIAGNOSIS — E222 Syndrome of inappropriate secretion of antidiuretic hormone: Secondary | ICD-10-CM | POA: Diagnosis not present

## 2016-04-13 ENCOUNTER — Ambulatory Visit (INDEPENDENT_AMBULATORY_CARE_PROVIDER_SITE_OTHER): Payer: BLUE CROSS/BLUE SHIELD | Admitting: Family Medicine

## 2016-04-13 ENCOUNTER — Encounter: Payer: Self-pay | Admitting: Family Medicine

## 2016-04-13 DIAGNOSIS — I10 Essential (primary) hypertension: Secondary | ICD-10-CM

## 2016-04-13 DIAGNOSIS — E876 Hypokalemia: Secondary | ICD-10-CM | POA: Insufficient documentation

## 2016-04-13 DIAGNOSIS — E222 Syndrome of inappropriate secretion of antidiuretic hormone: Secondary | ICD-10-CM

## 2016-04-13 DIAGNOSIS — R636 Underweight: Secondary | ICD-10-CM

## 2016-04-13 DIAGNOSIS — R569 Unspecified convulsions: Secondary | ICD-10-CM

## 2016-04-13 NOTE — Assessment & Plan Note (Signed)
Now almost in nml range. Continue until follow up with ENDO.

## 2016-04-13 NOTE — Assessment & Plan Note (Signed)
Now in nml range on supplement.

## 2016-04-13 NOTE — Assessment & Plan Note (Signed)
Well controlled. Continue current medication.  

## 2016-04-13 NOTE — Assessment & Plan Note (Signed)
Na now in nml range.

## 2016-04-13 NOTE — Patient Instructions (Addendum)
Try to increase portion size, Increase protein in diet. Consider adding ensure/ protein supplement  to diet  Hold exercise for now.

## 2016-04-13 NOTE — Progress Notes (Signed)
Pre visit review using our clinic review tool, if applicable. No additional management support is needed unless otherwise documented below in the visit note. 

## 2016-04-13 NOTE — Assessment & Plan Note (Signed)
Followed by Dr. Krista Blue. Doing better on Lamictal.  No further seizures.

## 2016-04-13 NOTE — Assessment & Plan Note (Signed)
Encouraged to increase protein and calories in diet.

## 2016-04-13 NOTE — Progress Notes (Signed)
Subjective:    Patient ID: Candice Gray, female    DOB: 07-20-1955, 60 y.o.   MRN: [id number]  HPI   60 year old female with history of SIADH presents for follow up new onset syncope and witnessed tonic clonic seizure resulting in hospitalization from 11/2 to 11/4.  Recommendations for Outpatient Follow-up per D/C summary:  1. Pt will need to follow up with PCP in 2-3 weeks post discharge 2. Please obtain BMP to evaluate electrolytes and kidney function, Na level, Mg and K level  Pt presented with syncope  With questionable CVA/TIA seizure. EEG suspicious for seizure, MRI neg CVA, mild atrophy Neuro consulted and pt started on Keppra  Pt started on statin at discharge given  concern for TIA etc.  SIADH not felt to be cause of syncope.  Na at baseline at discharge. Supplemented Mg and K given low.  needs re-eval now. Leukocytosis felt to be reactive.    Pt followed up with Neuro, Dr.  Krista Blue on 11/9. Note reviewed in detail. EEG showed focal abnormality at anterior temporal lobe  Shre not tolerate Keppra , had tinnitus. Changed to Lamictal... titration up  TODAY she reports:   She has been feeling well, no further issues. No lightheadedness, no syncope or seizure.  No chest pain, no SOB.  She is tolerating the Lamictal better  Had labs at Dr. Buddy Duty 11/14.. Mg 2.2, NA 135, K 4.8, nml cr.  Has follow up in Dec 19.   She is no longer having any ETOH and marijuana.   BP Readings from Last 3 Encounters:  04/13/16 134/75  04/01/16 (!) 143/89  03/27/16 130/83    She does feel like she may be borderline anorexic. She has lost weight 5 lbs in last few weeks ( feels it is due to not drinking beer) and has some body image issues in past, better now. As a teenage, she tried to diet to low weight.   She is trying to eat more, eating  3 meals a day and healthy snacks.   Wt Readings from Last 3 Encounters:  04/13/16 122 lb 12 oz (55.7 kg)  04/01/16 124 lb (56.2 kg)  03/26/16 131 lb  (59.4 kg)    Body mass index is 18.66 kg/m.   Review of Systems  Constitutional: Negative for fatigue and fever.  HENT: Negative for ear pain.   Eyes: Negative for pain.  Respiratory: Negative for chest tightness and shortness of breath.   Cardiovascular: Negative for chest pain, palpitations and leg swelling.  Gastrointestinal: Negative for abdominal pain.  Genitourinary: Negative for dysuria.       Objective:   Physical Exam  Constitutional: Vital signs are normal. She appears well-developed and well-nourished. She is cooperative.  Non-toxic appearance. She does not appear ill. No distress.  Thin appearing  HENT:  Head: Normocephalic.  Right Ear: Hearing, tympanic membrane, external ear and ear canal normal. Tympanic membrane is not erythematous, not retracted and not bulging.  Left Ear: Hearing, tympanic membrane, external ear and ear canal normal. Tympanic membrane is not erythematous, not retracted and not bulging.  Nose: No mucosal edema or rhinorrhea. Right sinus exhibits no maxillary sinus tenderness and no frontal sinus tenderness. Left sinus exhibits no maxillary sinus tenderness and no frontal sinus tenderness.  Mouth/Throat: Uvula is midline, oropharynx is clear and moist and mucous membranes are normal.  Eyes: Conjunctivae, EOM and lids are normal. Pupils are equal, round, and reactive to light. Lids are everted and swept, no  foreign bodies found.  Neck: Trachea normal and normal range of motion. Neck supple. Carotid bruit is not present. No thyroid mass and no thyromegaly present.  Cardiovascular: Normal rate, regular rhythm, S1 normal, S2 normal, normal heart sounds, intact distal pulses and normal pulses.  Exam reveals no gallop and no friction rub.   No murmur heard. Pulmonary/Chest: Effort normal and breath sounds normal. No tachypnea. No respiratory distress. She has no decreased breath sounds. She has no wheezes. She has no rhonchi. She has no rales.  Abdominal:  Soft. Normal appearance and bowel sounds are normal. There is no tenderness.  Neurological: She is alert.  Skin: Skin is warm, dry and intact. No rash noted.  Psychiatric: Her speech is normal and behavior is normal. Judgment and thought content normal. Her mood appears not anxious. Cognition and memory are normal. She does not exhibit a depressed mood.          Assessment & Plan:

## 2016-04-20 ENCOUNTER — Encounter: Payer: Self-pay | Admitting: Family Medicine

## 2016-04-26 DIAGNOSIS — H401131 Primary open-angle glaucoma, bilateral, mild stage: Secondary | ICD-10-CM | POA: Diagnosis not present

## 2016-05-11 DIAGNOSIS — E210 Primary hyperparathyroidism: Secondary | ICD-10-CM | POA: Diagnosis not present

## 2016-05-11 DIAGNOSIS — E222 Syndrome of inappropriate secretion of antidiuretic hormone: Secondary | ICD-10-CM | POA: Diagnosis not present

## 2016-05-11 DIAGNOSIS — M810 Age-related osteoporosis without current pathological fracture: Secondary | ICD-10-CM | POA: Diagnosis not present

## 2016-05-24 DIAGNOSIS — L219 Seborrheic dermatitis, unspecified: Secondary | ICD-10-CM

## 2016-05-24 HISTORY — DX: Seborrheic dermatitis, unspecified: L21.9

## 2016-05-26 ENCOUNTER — Other Ambulatory Visit: Payer: Self-pay

## 2016-05-26 ENCOUNTER — Other Ambulatory Visit (HOSPITAL_COMMUNITY): Payer: Self-pay | Admitting: Internal Medicine

## 2016-05-26 MED ORDER — METOPROLOL TARTRATE 25 MG PO TABS: 12.5000 mg | ORAL_TABLET | Freq: Two times a day (BID) | ORAL | 10 refills | Status: DC

## 2016-05-26 NOTE — Telephone Encounter (Signed)
Pt request refill metoprolol to [facility];per 03/2016 visit refilled. Pt voiced understanding.

## 2016-06-15 ENCOUNTER — Encounter: Payer: Self-pay | Admitting: Gastroenterology

## 2016-07-30 DIAGNOSIS — E871 Hypo-osmolality and hyponatremia: Secondary | ICD-10-CM | POA: Diagnosis not present

## 2016-08-16 DIAGNOSIS — E210 Primary hyperparathyroidism: Secondary | ICD-10-CM | POA: Diagnosis not present

## 2016-08-16 DIAGNOSIS — E222 Syndrome of inappropriate secretion of antidiuretic hormone: Secondary | ICD-10-CM | POA: Diagnosis not present

## 2016-08-16 DIAGNOSIS — M810 Age-related osteoporosis without current pathological fracture: Secondary | ICD-10-CM | POA: Diagnosis not present

## 2016-09-29 ENCOUNTER — Ambulatory Visit (INDEPENDENT_AMBULATORY_CARE_PROVIDER_SITE_OTHER): Payer: BLUE CROSS/BLUE SHIELD | Admitting: Neurology

## 2016-09-29 ENCOUNTER — Encounter: Payer: Self-pay | Admitting: Neurology

## 2016-09-29 VITALS — BP 179/94 | HR 98 | Ht 68.0 in | Wt 127.0 lb

## 2016-09-29 DIAGNOSIS — R569 Unspecified convulsions: Secondary | ICD-10-CM

## 2016-09-29 MED ORDER — LAMOTRIGINE 100 MG PO TABS: 100.0000 mg | ORAL_TABLET | Freq: Two times a day (BID) | ORAL | 4 refills | Status: DC

## 2016-09-29 NOTE — Progress Notes (Signed)
PATIENT: Candice Gray DOB: July 17, 1955  Chief Complaint  Patient presents with  . Seizures    She is here with her husband, Candice Gray.       HISTORICAL  Candice Gray is a 61 years old right-handed female, seen in refer by her primary care doctor  Candice Gray for evaluation of seizure on March 25 2016, initial evaluation was on April 01 2016.  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  She was put on water restriction 64 ounces daily ever since, is under close supervision of endocrinologist, she reported frequent thirsty recently, has liberated her water intake some,  On March 25 2016 around 10:00 in the morning, she noticed mild dizziness, sat down in the chair, lost consciousness, woke up on ambulance confused, she has a witnessed tonic-clonic seizure activity, her body becoming rigid, mouth open, eyes rolled back, lasting for a few minutes, followed by post event confusion.   I personally reviewed MRI of the brain on March 25 2016, mild generalized atrophy no acute abnormality   I reviewed her laboratory evaluation in November, glucose was 123, sodium was 129, potassium was 3.2, chloride was 90, glucose was 118, creatinine 0.78, WBC was mildly elevated 13, hemoglobin was 15 point 1, magnesium 1.6, A1c was 5.5, normal TSH, BNP was 112, magnesium 1.8, UDS was positive for marijuana, alcohol was less than 5, negative hepatitis C,   EEG showed: posterior dominant rhythm of 9 Hz reactive eye opening and closure., there are epileptiform discharges per description with maximum at F7 and T3. These do not evolve into electrographic seizures.  No sleep was recorded.    She was treated with Keppra 500 mg 3 times a day, she complains of being anxious about her health, get irritated easily, she does smoke marijuana regularly,  now she has stopped smoking.  UPDATE Sep 29 2016: She is doing well, able to tolerate lamotrigine 100 mg twice a day much better, no recurrent seizure,   REVIEW OF SYSTEMS: Full 14 system review of systems performed and notable only for ringing ears  ALLERGIES: Allergies  Allergen Reactions  . Azelastine Hcl     REACTION: increased bp  . Miralax [Polyethylene Glycol] Itching  . Red Dye Other (See Comments)    Contact Dermatitis  . Zyban [Bupropion] Swelling    HOME MEDICATIONS: Current Outpatient Prescriptions  Medication Sig Dispense Refill  . amLODipine (NORVASC) 2.5 MG tablet Take 1 tablet (2.5 mg total) by mouth 2 (two) times daily. 180 tablet 3  . Biotin 1 MG CAPS Take 1 tablet by mouth daily.    . Calcium-Vitamin D (CALTRATE 600 PLUS-VIT D PO) Take 1 tablet by mouth daily.    . Cholecalciferol (VITAMIN D-3 PO) Take 1 capsule by mouth daily.    Marland Kitchen ibandronate (BONIVA) 150 MG tablet Take 150 mg by mouth every 30 (thirty) days. Take in the morning with a full glass of water, on an empty stomach, and do not take anything else by mouth or lie down for the next 30 min.    . lamoTRIgine (LAMICTAL) 100 MG tablet Take 1 tablet (100 mg total) by mouth 2 (two) times daily. 60 tablet 11  . latanoprost (XALATAN) 0.005 % ophthalmic solution Place 1 drop into both eyes at bedtime.    . metoprolol tartrate (LOPRESSOR) 25 MG  tablet Take 0.5 tablets (12.5 mg total) by mouth 2 (two) times daily. 30 tablet 10  . triamcinolone cream (KENALOG) 0.5 % Apply 1 application topically 2 (two) times daily. (Patient taking differently: Apply 1 application topically 2 (two) times daily as needed. ) 30 g 0  . vitamin C (ASCORBIC ACID) 500 MG tablet Take 500 mg by mouth daily.     No current facility-administered medications for this visit.     PAST MEDICAL HISTORY: Past Medical History:  Diagnosis Date  . Anxiety   . Deviated nasal septum   . Diverticulitis   . Diverticulosis   . HTN (hypertension)     . Hyperlipemia   . Hyperplastic colon polyp   . Hyponatremia   . Hyponatremia   . MVP (mitral valve prolapse)   . Raynaud phenomenon   . SIADH (syndrome of inappropriate ADH production) ([place])     PAST SURGICAL HISTORY: Past Surgical History:  Procedure Laterality Date  . ABDOMINAL HYSTERECTOMY    . CRYOTHERAPY     for cervical dysplasia  . NM MYOCAR PERF WALL MOTION  11/2007   bruce myoview; perfusion defect in anterior myocardium (breast attenuation); post-stress EF 77%; normal, low risk study   . TRANSTHORACIC ECHOCARDIOGRAM  11/2007   EF=>55%; mild MR; trace TR  . TUBAL LIGATION      FAMILY HISTORY: Family History  Problem Relation Age of Onset  . Hyperlipidemia Father   . Hypertension Father   . Diabetes Father   . Atrial fibrillation Father   . Diabetes Paternal Grandmother   . Cancer Paternal Grandfather     brain tumor  . Hyperlipidemia Other   . Hypertension Other   . Coronary artery disease Maternal Grandmother   . Diabetes Maternal Grandmother   . Hyperlipidemia Brother   . Transient ischemic attack Mother   . Colon cancer Neg Hx     SOCIAL HISTORY:  Social History   Social History  . Marital status: Married    Spouse name: N/A  . Number of children: 2  . Years of education: College   Occupational History  . 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 Smoking status: Former Smoker    Types: Cigarettes    Quit date: 06/05/1998  . Smokeless tobacco: Never Used  . Alcohol use 8.4 oz/week    14 Cans of beer per week     Comment: daily  . Drug use: Yes    Types: Marijuana     Comment: remote  . Sexual activity: Not on file   Other Topics Concern  . Not on file   Social History Narrative   Lives at husband and 2 sons   Right-handed   Regular exercise--yes   Caffeine: daily coffee   Diet: fruits and veggies        PHYSICAL EXAM   Vitals:   09/29/16 1001  BP: (!) 179/94  Pulse: 98  Weight:  127 lb (57.6 kg)  Height: 5\' 8"  (1.727 m)    Not recorded      Body mass index is 19.31 kg/m.  PHYSICAL EXAMNIATION:  Gen: NAD, conversant, well nourised, obese, well groomed                     Cardiovascular: Regular rate rhythm, no peripheral edema, warm, nontender. Eyes: Conjunctivae clear without exudates or hemorrhage Neck: Supple, no carotid bruits. Pulmonary: Clear to auscultation bilaterally   NEUROLOGICAL EXAM:  MENTAL  STATUS: Speech:    Speech is normal; fluent and spontaneous with normal comprehension.  Cognition:     Orientation to time, place and person     Normal recent and remote memory     Normal Attention span and concentration     Normal Language, naming, repeating,spontaneous speech     Fund of knowledge   CRANIAL NERVES: CN II: Visual fields are full to confrontation. Fundoscopic exam is normal with sharp discs and no vascular changes. Pupils are round equal and briskly reactive to light. CN III, IV, VI: extraocular movement are normal. No ptosis. CN V: Facial sensation is intact to pinprick in all 3 divisions bilaterally. Corneal responses are intact.  CN VII: Face is symmetric with normal eye closure and smile. CN VIII: Hearing is normal to rubbing fingers CN IX, X: Palate elevates symmetrically. Phonation is normal. CN XI: Head turning and shoulder shrug are intact CN XII: Tongue is midline with normal movements and no atrophy.  MOTOR: There is no pronator drift of out-stretched arms. Muscle bulk and tone are normal. Muscle strength is normal.  REFLEXES: Reflexes are 2+ and symmetric at the biceps, triceps, knees, and ankles. Plantar responses are flexor.  SENSORY: Intact to light touch, pinprick, positional sensation and vibratory sensation are intact in fingers and toes.  COORDINATION: Rapid alternating movements and fine finger movements are intact. There is no dysmetria on finger-to-nose and heel-knee-shin.    GAIT/STANCE: Posture is  normal. Gait is steady with normal steps, base, arm swing, and turning. Heel and toe walking are normal. Tandem gait is normal.  Romberg is absent.   DIAGNOSTIC DATA (LABS, IMAGING, TESTING) - I reviewed patient records, labs, notes, testing and imaging myself where available.   ASSESSMENT AND PLAN  Candice Gray is a 61 y.o. female    Seizure on March 25 2016  MRI of the brain showed generalized atrophy  EEG showed focal abnormality at anterior temporal lobe,  She could not tolerate Keppra, complains of moodiness, anxiety,  Doing well on lamotrigine titrating to 100 mg twice a day,  Return to clinic in one year   Marcial Pacas, M.D. Ph.D.  [facility] [address], [place], [place] [address] Ph: [phone] Fax: [phone]  CC: Candice Sanders, MD

## 2016-10-11 ENCOUNTER — Other Ambulatory Visit: Payer: Self-pay | Admitting: *Deleted

## 2016-10-11 MED ORDER — TRIAMCINOLONE ACETONIDE 0.5 % EX CREA: 1.0000 "application " | TOPICAL_CREAM | Freq: Two times a day (BID) | CUTANEOUS | 0 refills | Status: DC | PRN

## 2016-10-11 NOTE — Telephone Encounter (Signed)
Make sure she is not using the cream in same location for more than 2 weeks at a time. Can thin the skin and make blood vessels overgrow at the location of prolonged use.

## 2016-10-11 NOTE — Telephone Encounter (Signed)
Candice Gray notified as instructed by telephone.

## 2016-10-11 NOTE — Telephone Encounter (Signed)
Last office visit 04/13/2016.  Last refilled 08/05/2015 for 30 g with no refills.  Ok to refill?

## 2016-10-25 DIAGNOSIS — H401131 Primary open-angle glaucoma, bilateral, mild stage: Secondary | ICD-10-CM | POA: Diagnosis not present

## 2016-12-07 ENCOUNTER — Other Ambulatory Visit: Payer: Self-pay | Admitting: Internal Medicine

## 2017-01-10 ENCOUNTER — Other Ambulatory Visit: Payer: Self-pay | Admitting: Internal Medicine

## 2017-01-10 ENCOUNTER — Other Ambulatory Visit: Payer: Self-pay

## 2017-01-10 MED ORDER — AMLODIPINE BESYLATE 2.5 MG PO TABS: 2.5000 mg | ORAL_TABLET | Freq: Two times a day (BID) | ORAL | 2 refills | Status: DC

## 2017-01-10 NOTE — Telephone Encounter (Signed)
Pt request refill amlodipine to [facility]. Pt has appt to see Dr Diona Browner for CPX on 03/18/17. Last seen 03/12/16 annual. Refilled per protocol and pt voiced understanding.

## 2017-02-09 DIAGNOSIS — Z1231 Encounter for screening mammogram for malignant neoplasm of breast: Secondary | ICD-10-CM | POA: Diagnosis not present

## 2017-02-10 ENCOUNTER — Encounter: Payer: Self-pay | Admitting: Family Medicine

## 2017-02-16 DIAGNOSIS — M810 Age-related osteoporosis without current pathological fracture: Secondary | ICD-10-CM | POA: Diagnosis not present

## 2017-02-16 DIAGNOSIS — E210 Primary hyperparathyroidism: Secondary | ICD-10-CM | POA: Diagnosis not present

## 2017-02-16 DIAGNOSIS — E222 Syndrome of inappropriate secretion of antidiuretic hormone: Secondary | ICD-10-CM | POA: Diagnosis not present

## 2017-03-10 ENCOUNTER — Ambulatory Visit (HOSPITAL_COMMUNITY): Payer: BLUE CROSS/BLUE SHIELD

## 2017-03-11 ENCOUNTER — Telehealth: Payer: Self-pay | Admitting: Family Medicine

## 2017-03-11 ENCOUNTER — Telehealth (INDEPENDENT_AMBULATORY_CARE_PROVIDER_SITE_OTHER): Payer: BLUE CROSS/BLUE SHIELD | Admitting: Family Medicine

## 2017-03-11 ENCOUNTER — Other Ambulatory Visit (INDEPENDENT_AMBULATORY_CARE_PROVIDER_SITE_OTHER): Payer: BLUE CROSS/BLUE SHIELD

## 2017-03-11 DIAGNOSIS — R7303 Prediabetes: Secondary | ICD-10-CM

## 2017-03-11 DIAGNOSIS — M810 Age-related osteoporosis without current pathological fracture: Secondary | ICD-10-CM

## 2017-03-11 DIAGNOSIS — E785 Hyperlipidemia, unspecified: Secondary | ICD-10-CM

## 2017-03-11 LAB — COMPREHENSIVE METABOLIC PANEL
ALT: 12 U/L (ref 0–35)
AST: 17 U/L (ref 0–37)
Albumin: 4.7 g/dL (ref 3.5–5.2)
Alkaline Phosphatase: 54 U/L (ref 39–117)
BILIRUBIN TOTAL: 0.4 mg/dL (ref 0.2–1.2)
BUN: 10 mg/dL (ref 6–23)
CALCIUM: 10.4 mg/dL (ref 8.4–10.5)
CO2: 30 meq/L (ref 19–32)
CREATININE: 0.74 mg/dL (ref 0.40–1.20)
Chloride: 90 mEq/L — ABNORMAL LOW (ref 96–112)
GFR: 84.67 mL/min (ref >60.00)
GLUCOSE: 94 mg/dL (ref 70–99)
Potassium: 4.6 mEq/L (ref 3.5–5.1)
Sodium: 130 mEq/L — ABNORMAL LOW (ref 135–145)
Total Protein: 7.7 g/dL (ref 6.0–8.3)

## 2017-03-11 LAB — MAGNESIUM: MAGNESIUM: 1.9 mg/dL (ref 1.5–2.5)

## 2017-03-11 LAB — LIPID PANEL
CHOL/HDL RATIO: 2
Cholesterol: 256 mg/dL — ABNORMAL HIGH (ref 0–200)
HDL: 141.9 mg/dL (ref >39.00)
LDL CALC: 105 mg/dL — AB (ref 0–99)
NONHDL: 114.14
Triglycerides: 48 mg/dL (ref 0.0–149.0)
VLDL: 9.6 mg/dL (ref 0.0–40.0)

## 2017-03-11 LAB — HEMOGLOBIN A1C: Hgb A1c MFr Bld: 5.7 % (ref 4.6–6.5)

## 2017-03-11 LAB — VITAMIN D 25 HYDROXY (VIT D DEFICIENCY, FRACTURES): VITD: 40.76 ng/mL (ref 30.00–100.00)

## 2017-03-11 NOTE — Telephone Encounter (Signed)
Labs just drawn this morning. Not available when I looked.

## 2017-03-11 NOTE — Telephone Encounter (Signed)
Best number [phone]  Pt needs lab work sent to Dr Buddy Duty  @ [facility] Fax # [phone]

## 2017-03-11 NOTE — Telephone Encounter (Signed)
-----   Message from Ellamae Sia sent at 03/02/2017 11:16 AM EDT ----- Regarding: Lab orders for Friday, 10.19.18 Patient is scheduled for CPX labs, please order future labs, Thanks , Karna Christmas

## 2017-03-11 NOTE — Telephone Encounter (Signed)
Please do so for Dr. Jacinto Reap

## 2017-03-14 NOTE — Telephone Encounter (Signed)
Lab results faxed to Dr. Buddy Duty at [phone] as requested.

## 2017-03-18 ENCOUNTER — Encounter: Payer: BLUE CROSS/BLUE SHIELD | Admitting: Family Medicine

## 2017-03-25 ENCOUNTER — Ambulatory Visit (INDEPENDENT_AMBULATORY_CARE_PROVIDER_SITE_OTHER): Payer: BLUE CROSS/BLUE SHIELD | Admitting: Family Medicine

## 2017-03-25 VITALS — BP 138/78 | HR 75 | Temp 97.9°F | Ht 68.5 in | Wt 130.5 lb

## 2017-03-25 DIAGNOSIS — Z23 Encounter for immunization: Secondary | ICD-10-CM

## 2017-03-25 DIAGNOSIS — I10 Essential (primary) hypertension: Secondary | ICD-10-CM | POA: Diagnosis not present

## 2017-03-25 DIAGNOSIS — R21 Rash and other nonspecific skin eruption: Secondary | ICD-10-CM

## 2017-03-25 DIAGNOSIS — M810 Age-related osteoporosis without current pathological fracture: Secondary | ICD-10-CM

## 2017-03-25 DIAGNOSIS — E222 Syndrome of inappropriate secretion of antidiuretic hormone: Secondary | ICD-10-CM | POA: Diagnosis not present

## 2017-03-25 DIAGNOSIS — E785 Hyperlipidemia, unspecified: Secondary | ICD-10-CM | POA: Diagnosis not present

## 2017-03-25 DIAGNOSIS — R569 Unspecified convulsions: Secondary | ICD-10-CM

## 2017-03-25 DIAGNOSIS — R7303 Prediabetes: Secondary | ICD-10-CM

## 2017-03-25 DIAGNOSIS — Z0000 Encounter for general adult medical examination without abnormal findings: Secondary | ICD-10-CM | POA: Diagnosis not present

## 2017-03-25 MED ORDER — METOPROLOL TARTRATE 25 MG PO TABS: 12.5000 mg | ORAL_TABLET | Freq: Two times a day (BID) | ORAL | 3 refills | Status: DC

## 2017-03-25 MED ORDER — AMLODIPINE BESYLATE 2.5 MG PO TABS: 2.5000 mg | ORAL_TABLET | Freq: Two times a day (BID) | ORAL | 3 refills | Status: DC

## 2017-03-25 NOTE — Assessment & Plan Note (Signed)
resolved 

## 2017-03-25 NOTE — Assessment & Plan Note (Signed)
Good control and very high HDL.

## 2017-03-25 NOTE — Assessment & Plan Note (Signed)
Followed by ENDO 

## 2017-03-25 NOTE — Assessment & Plan Note (Signed)
Plans to start reclast

## 2017-03-25 NOTE — Patient Instructions (Signed)
Please stop at the front desk to set up referral.  

## 2017-03-25 NOTE — Progress Notes (Signed)
Subjective:    Patient ID: Candice Gray, female    DOB: December 14, 1955, 61 y.o.   MRN: [id number]  HPI  The patient is here for annual wellness exam and preventative care.     She is having recurrent rash on face.. Request referral to derm.  Triamcinolone not helping much.  Off  And on for 2 years.  Hypertension:   Good control today on amlodipine, toprol Using medication without problems or lightheadedness:  none Chest pain with exertion: none Edema:none Short of breath: none Average home BPs: good control. Other issues:  Elevated Cholesterol:  Tolerable control given very high HDL. On no medication. Lab Results  Component Value Date   CHOL 256 (H) 03/11/2017   HDL 141.90 03/11/2017   LDLCALC 105 (H) 03/11/2017   LDLDIRECT 74.6 12/31/2011   TRIG 48.0 03/11/2017   CHOLHDL 2 03/11/2017  Diet compliance: healthy diet. Exercise:  Walking off and on. Other complaints:   prediabetes  Lab Results  Component Value Date   HGBA1C 5.7 03/11/2017     SIADH: followed by ENDO, Dr. Buddy Duty  Osteoporosis: now off Jagual.. Plans reclast.   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 Followed by Dr Krista Blue, Neuro  No further seizures since original one year later.  Social History /Family History/Past Medical History reviewed in detail and updated in EMR if needed. Blood pressure 138/78, pulse 75, temperature 97.9 F (36.6 C), temperature source Oral, height 5' 8.5" (1.74 m), weight 130 lb 8 oz (59.2 kg).  Wt Readings from Last 3 Encounters:  03/25/17 130 lb 8 oz (59.2 kg)  09/29/16 127 lb (57.6 kg)  04/13/16 122 lb 12 oz (55.7 kg)  Body mass index is 19.55 kg/m.    Review of Systems  Constitutional: Negative for fatigue and fever.  HENT: Negative for congestion.   Eyes: Negative for pain.  Respiratory: Negative for cough and shortness of breath.   Cardiovascular: Negative for chest pain, palpitations and leg swelling.  Gastrointestinal: Negative for abdominal pain.    Genitourinary: Negative for dysuria and vaginal bleeding.  Musculoskeletal: Negative for back pain.  Neurological: Negative for syncope, light-headedness and headaches.  Psychiatric/Behavioral: Negative for dysphoric mood.       Objective:   Physical Exam  Constitutional: Vital signs are normal. She appears well-developed and well-nourished. She is cooperative.  Non-toxic appearance. She does not appear ill. No distress.  Thin appearing  HENT:  Head: Normocephalic.  Right Ear: Hearing, tympanic membrane, external ear and ear canal normal. Tympanic membrane is not erythematous, not retracted and not bulging.  Left Ear: Hearing, tympanic membrane, external ear and ear canal normal. Tympanic membrane is not erythematous, not retracted and not bulging.  Nose: Nose normal. No mucosal edema or rhinorrhea. Right sinus exhibits no maxillary sinus tenderness and no frontal sinus tenderness. Left sinus exhibits no maxillary sinus tenderness and no frontal sinus tenderness.  Mouth/Throat: Uvula is midline, oropharynx is clear and moist and mucous membranes are normal.  Eyes: Pupils are equal, round, and reactive to light. Conjunctivae, EOM and lids are normal. Lids are everted and swept, no foreign bodies found.  Neck: Trachea normal and normal range of motion. Neck supple. Carotid bruit is not present. No thyroid mass and no thyromegaly present.  Cardiovascular: Normal rate, regular rhythm, S1 normal, S2 normal, normal heart sounds, intact distal pulses and normal pulses.  Exam reveals no gallop and no friction rub.   No murmur heard. Pulmonary/Chest: Effort normal and breath sounds normal. No tachypnea.  No respiratory distress. She has no decreased breath sounds. She has no wheezes. She has no rhonchi. She has no rales.  Abdominal: Soft. Normal appearance and bowel sounds are normal. She exhibits no distension, no fluid wave, no abdominal bruit and no mass. There is no hepatosplenomegaly. There is no  tenderness. There is no rebound, no guarding and no CVA tenderness. No hernia.  Musculoskeletal: She exhibits no tenderness.  Lymphadenopathy:    She has no cervical adenopathy.    She has no axillary adenopathy.  Neurological: She is alert. She has normal strength. No cranial nerve deficit or sensory deficit.  Skin: Skin is warm, dry and intact. Rash noted. Rash is macular.  On face and neck.. Erythematous, dry flaky, mildly itchy  Psychiatric: Her speech is normal and behavior is normal. Judgment and thought content normal. Her mood appears not anxious. Cognition and memory are normal. She does not exhibit a depressed mood.          Assessment & Plan:  The patient's preventative maintenance and recommended screening tests for an annual wellness exam were reviewed in full today. Brought up to date unless services declined.  Counselled on the importance of diet, exercise, and its role in overall health and mortality. The patient's FH and SH was reviewed, including their home life, tobacco status, and drug and alcohol status.   Last DEXA 2015 worsening osteopenia, stopped boniva. 2017 osteoporosis ENDO treating.. planning on starting Reclast. Last colon: 2011 was bengin polyps.. No repeat screen in this way given small caliber colon. Further eval per Dr. Olevia Perches. Likely in 10 years ifob.  UTD with vaccines Td 2010,  given flu, shingles  Mammo nml 01/2017 nml Pap q 5 years, last nml in 2016, neg HPV,  DVE not indicatedasymptomatic and no family history. Counseled against marijuana use.  Nonsmoker Hep C: neg

## 2017-03-25 NOTE — Assessment & Plan Note (Signed)
Well controlled. Continue current medication.  

## 2017-03-25 NOTE — Assessment & Plan Note (Signed)
Improved

## 2017-03-25 NOTE — Addendum Note (Signed)
Addended by: Carter Kitten on: 03/25/2017 10:04 AM   Modules accepted: Orders

## 2017-03-25 NOTE — Assessment & Plan Note (Signed)
No further on lamictal. Followed by neuro.

## 2017-04-01 ENCOUNTER — Telehealth: Payer: Self-pay | Admitting: Family Medicine

## 2017-04-01 NOTE — Telephone Encounter (Signed)
Copied from [place] [id number]. Topic: General - Other >> Apr 01, 2017  3:12 PM Neva Seat wrote: Pt called to say she received a call from Dr. Cindra Eves office.  The office says they cannot read the lab results for pt. The fax was not legible.   [facility] office is asking to refax the results to [phone] - ASAP

## 2017-04-04 DIAGNOSIS — L218 Other seborrheic dermatitis: Secondary | ICD-10-CM | POA: Diagnosis not present

## 2017-04-05 NOTE — Telephone Encounter (Signed)
Labs re-faxed to Dr. Buddy Duty at [phone] as requested.

## 2017-04-06 ENCOUNTER — Ambulatory Visit (HOSPITAL_COMMUNITY)
Admission: RE | Admit: 2017-04-06 | Discharge: 2017-04-06 | Disposition: A | Discharge status: Home / Self Care | Payer: BLUE CROSS/BLUE SHIELD | Source: Ambulatory Visit | Attending: Internal Medicine | Admitting: Internal Medicine

## 2017-04-06 ENCOUNTER — Encounter (HOSPITAL_COMMUNITY): Payer: Self-pay

## 2017-04-06 DIAGNOSIS — M810 Age-related osteoporosis without current pathological fracture: Secondary | ICD-10-CM | POA: Diagnosis not present

## 2017-04-06 MED ORDER — SODIUM CHLORIDE 0.9 % IV SOLN
INTRAVENOUS | Status: AC
  Administered 2017-04-06: 08:00:00 via INTRAVENOUS

## 2017-04-06 MED ORDER — ZOLEDRONIC ACID 5 MG/100ML IV SOLN
5.0000 mg | Freq: Once | INTRAVENOUS | Status: AC
  Administered 2017-04-06: 5 mg via INTRAVENOUS
  Filled 2017-04-06: qty 100

## 2017-04-06 NOTE — Progress Notes (Addendum)
Reclast given today.  D/c instructions given to pt about reclast.  Pt stayed for 15 minutes post infusion. Pt tolerated well.

## 2017-04-06 NOTE — Discharge Instructions (Signed)

## 2017-04-25 DIAGNOSIS — L219 Seborrheic dermatitis, unspecified: Secondary | ICD-10-CM | POA: Insufficient documentation

## 2017-04-25 DIAGNOSIS — L218 Other seborrheic dermatitis: Secondary | ICD-10-CM | POA: Diagnosis not present

## 2017-05-24 HISTORY — PX: CATARACT EXTRACTION, BILATERAL: SHX1313

## 2017-09-21 ENCOUNTER — Telehealth: Payer: Self-pay | Admitting: *Deleted

## 2017-09-21 NOTE — Telephone Encounter (Signed)
I spoke to Candice Gray and she relayed that she has appt with CM/NP on 09/30/2017.  She has experienced sx of blackout (zone out / Microbiologist.  Some dizziness/ lightheadedness. Another funny feeling episode when her family was out of town.  She stated at least today she rested for 45 mins and is better.  She has felt this way when her Na level is low.  Dr. Diona Browner has done a Na level 6 months ago.  I relayed to contact pcp for checking na level.  She has been taking lamotrigine 100mg  po BID.  She will call back as needed.

## 2017-09-28 NOTE — Progress Notes (Signed)
[facility]  PATIENT: Candice Gray DOB: [birth year]   REASON FOR VISIT: Follow-up for seizure disorder HISTORY FROM: Patient and husband   HISTORY OF PRESENT ILLNESS: Candice Gray is a 62 years old right-handed female, seen in refer by her primary care doctor  Jinny Sanders for evaluation of seizure on March 25 2016, initial evaluation was on April 01 2016.  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  She was put on water restriction 64 ounces daily ever since, is under close supervision of endocrinologist, she reported frequent thirsty recently, has liberated her water intake some,  On March 25 2016 around 10:00 in the morning, she noticed mild dizziness, sat down in the chair, lost consciousness, woke up on ambulance confused, she has a witnessed tonic-clonic seizure activity, her body becoming rigid, mouth open, eyes rolled back, lasting for a few minutes, followed by post event confusion.   I personally reviewed MRI of the brain on March 25 2016, mild generalized atrophy no acute abnormality   I reviewed her laboratory evaluation in November, glucose was 123, sodium was 129, potassium was 3.2, chloride was 90, glucose was 118, creatinine 0.78, WBC was mildly elevated 13, hemoglobin was 15 point 1, magnesium 1.6, A1c was 5.5, normal TSH, BNP was 112, magnesium 1.8, UDS was positive for marijuana, alcohol was less than 5, negative hepatitis C,  EEG showed: posterior dominant rhythm of 9 Hz reactive eye opening and closure., there are epileptiform discharges per description with maximum at F7 and T3. These do not evolve into electrographic seizures. No sleep was recorded.   She was treated with Keppra 500 mg 3 times a day, she complains of being anxious about her health, get irritated  easily, she does smoke marijuana regularly, now she has stopped smoking.  UPDATE Sep 29 2016:YY She is doing well, able to tolerate lamotrigine 100 mg twice a day much better, no recurrent seizure,  UPDATE  5/10/2019CM Candice Gray, 62 year old female returns for follow-up with history of seizure disorder.  She had one episode of amnesia-like event recently possibly a seizure also an episode followed by fogginess and high heart rate but  she had not eaten.  She is currently on Lamictal 100 mg twice daily.  She has a history of SIADH and she says when her sodium level gets low she can also have confusion.  It has not been checked since last year by PCP.  She returns for reevaluation REVIEW OF SYSTEMS: Full 14 system review of systems performed and notable only for those listed, all others are neg:  Constitutional: neg  Cardiovascular: neg Ear/Nose/Throat: neg  Skin: neg Eyes: neg Respiratory: neg Gastroitestinal: neg  Hematology/Lymphatic: neg  Endocrine: Excessive thirst Musculoskeletal:neg Allergy/Immunology: neg Neurological: Occasional dizziness Psychiatric: neg Sleep : Occasional daytime drowsiness   ALLERGIES: Allergies  Allergen Reactions  . Azelastine Hcl     REACTION: increased bp  . Depo-Medrol [Methylprednisolone] Itching  . Miralax [Polyethylene Glycol] Itching  . Red Dye Other (See Comments)    Contact Dermatitis  . Zyban [Bupropion] Swelling    HOME MEDICATIONS: Outpatient Medications Prior to Visit  Medication Sig Dispense Refill  . amLODipine (NORVASC) 2.5 MG tablet Take 1 tablet (2.5 mg total) by mouth 2 (two) times daily. 180 tablet 3  . Calcium-Vitamin D (CALTRATE 600 PLUS-VIT D PO) Take  1 tablet by mouth daily.    . Cholecalciferol (VITAMIN D-3 PO) Take 1 capsule by mouth daily.    . hydrocortisone 2.5 % cream Apply 1 application 2 (two) times daily topically.    . lamoTRIgine (LAMICTAL) 100 MG tablet Take 1 tablet (100 mg total) by mouth 2 (two) times  daily. 180 tablet 4  . latanoprost (XALATAN) 0.005 % ophthalmic solution Place 1 drop into both eyes at bedtime.    . metoprolol tartrate (LOPRESSOR) 25 MG tablet Take 0.5 tablets (12.5 mg total) by mouth 2 (two) times daily. 90 tablet 3  . triamcinolone cream (KENALOG) 0.5 % Apply 1 application topically 2 (two) times daily as needed. 30 g 0  . vitamin C (ASCORBIC ACID) 500 MG tablet Take 500 mg by mouth daily.    . zoledronic acid (RECLAST) 5 MG/100ML SOLN injection Inject 5 mg into the vein once.    . Biotin 1 MG CAPS Take 1 tablet by mouth daily.     No facility-administered medications prior to visit.     PAST MEDICAL HISTORY: Past Medical History:  Diagnosis Date  . Anxiety   . Deviated nasal septum   . Diverticulitis   . Diverticulosis   . HTN (hypertension)   . Hyperlipemia   . Hyperplastic colon polyp   . Hyponatremia   . Hyponatremia   . MVP (mitral valve prolapse)   . Raynaud phenomenon   . Seizures ([place])   . SIADH (syndrome of inappropriate ADH production) ([place])     PAST SURGICAL HISTORY: Past Surgical History:  Procedure Laterality Date  . ABDOMINAL HYSTERECTOMY    . CRYOTHERAPY     for cervical dysplasia  . NM MYOCAR PERF WALL MOTION  11/2007   bruce myoview; perfusion defect in anterior myocardium (breast attenuation); post-stress EF 77%; normal, low risk study   . TRANSTHORACIC ECHOCARDIOGRAM  11/2007   EF=>55%; mild MR; trace TR  . TUBAL LIGATION      FAMILY HISTORY: Family History  Problem Relation Age of Onset  . Hyperlipidemia Father   . Hypertension Father   . Diabetes Father   . Atrial fibrillation Father   . Diabetes Paternal Grandmother   . Cancer Paternal Grandfather        brain tumor  . Hyperlipidemia Other   . Hypertension Other   . Coronary artery disease Maternal Grandmother   . Diabetes Maternal Grandmother   . Hyperlipidemia Brother   . Transient ischemic attack Mother   . Colon cancer Neg Hx     SOCIAL HISTORY: Social  History   Socioeconomic History  . Marital status: Married    Spouse name: Not on file  . Number of children: 2  . Years of education: College  . Highest education level: Not on file  Occupational History  . Occupation: sub Dealer: UNEMPLOYED  Social Needs  . Financial resource strain: Not on file  . Food insecurity:    Worry: Not on file    Inability: Not on file  . Transportation needs:    Medical: Not on file    Non-medical: Not on file  Tobacco Use  . Smoking status: Former Smoker    Types: Cigarettes    Last attempt to quit: 06/05/1998    Years since quitting: 19.3  . Smokeless tobacco: Never Used  Substance and Sexual Activity  . Alcohol use: Yes    Alcohol/week: 8.4 oz    Types: 14 Cans of beer  per week    Comment: daily  . Drug use: Yes    Types: Marijuana    Comment: remote  . Sexual activity: Not on file  Lifestyle  . Physical activity:    Days per week: Not on file    Minutes per session: Not on file  . Stress: Not on file  Relationships  . Social connections:    Talks on phone: Not on file    Gets together: Not on file    Attends religious service: Not on file    Active member of club or organization: Not on file    Attends meetings of clubs or organizations: Not on file    Relationship status: Not on file  . Intimate partner violence:    Fear of current or ex partner: Not on file    Emotionally abused: Not on file    Physically abused: Not on file    Forced sexual activity: Not on file  Other Topics Concern  . Not on file  Social History Narrative   Lives at husband and 2 sons   Right-handed   Regular exercise--yes   Caffeine: daily coffee   Diet: fruits and veggies     PHYSICAL EXAM  Vitals:   09/30/17 0844  BP: 133/86  Pulse: 68  Weight: 125 lb 9.6 oz (57 kg)  Height: 5' 8.5" (1.74 m)   Body mass index is 18.82 kg/m.  Generalized: Well developed, in no acute distress  Head:  normocephalic and atraumatic,. Oropharynx benign  Neck: Supple,  Musculoskeletal: No deformity   Neurological examination   Mentation: Alert oriented to time, place, history taking. Attention span and concentration appropriate. Recent and remote memory intact.  Follows all commands speech and language fluent.   Cranial nerve II-XII: Pupils were equal round reactive to light extraocular movements were full, visual field were full on confrontational test. Facial sensation and strength were normal. hearing was intact to finger rubbing bilaterally. Uvula tongue midline. head turning and shoulder shrug were normal and symmetric.Tongue protrusion into cheek strength was normal. Motor: normal bulk and tone, full strength in the BUE, BLE, fine finger movements normal, no pronator drift. No focal weakness Sensory: normal and symmetric to light touch,  Coordination: finger-nose-finger,  no dysmetria Reflexes: Brachioradialis 2/2, biceps 2/2, triceps 2/2, patellar 2/2, Achilles 2/2, plantar responses were flexor bilaterally. Gait and Station: Rising up from seated position without assistance, normal stance,  moderate stride, good arm swing, smooth turning, unable to perform tiptoe on the rightdue to  broken toe, can heel walking without difficulty. Tandem gait is steady  DIAGNOSTIC DATA (LABS, IMAGING, TESTING) - I reviewed patient records, labs, notes, testing and imaging myself where available.  Lab Results  Component Value Date   WBC 13.0 (H) 03/27/2016   HGB 15.1 (H) 03/27/2016   HCT 43.8 03/27/2016   MCV 97.8 03/27/2016   PLT 237 03/27/2016      Component Value Date/Time   NA 130 (L) 03/11/2017 0912   NA 134 (A) 08/19/2010   K 4.6 03/11/2017 0912   CL 90 (L) 03/11/2017 0912   CO2 30 03/11/2017 0912   GLUCOSE 94 03/11/2017 0912   BUN 10 03/11/2017 0912   CREATININE 0.74 03/11/2017 0912   CALCIUM 10.4 03/11/2017 0912   PROT 7.7 03/11/2017 0912   ALBUMIN 4.7 03/11/2017 0912   AST 17  03/11/2017 0912   ALT 12 03/11/2017 0912   ALKPHOS 54 03/11/2017 0912   BILITOT 0.4 03/11/2017 0912   GFRNONAA >  60 03/27/2016 0336   GFRAA >60 03/27/2016 0336   Lab Results  Component Value Date   CHOL 256 (H) 03/11/2017   HDL 141.90 03/11/2017   LDLCALC 105 (H) 03/11/2017   LDLDIRECT 74.6 12/31/2011   TRIG 48.0 03/11/2017   CHOLHDL 2 03/11/2017   Lab Results  Component Value Date   HGBA1C 5.7 03/11/2017   Lab Results  Component Value Date   VITAMINB12 390 12/20/2012   Lab Results  Component Value Date   TSH 1.824 03/25/2016      ASSESSMENT AND PLAN  62 y.o. year old female with history of seizure disorder March 25, 2016,MRI of the brain showed generalized atrophy, he failed Keppra due to moodiness anxiety EEG showed focal abnormality at anterior temporal lobe,.  She is currently on Lamictal 100 mg twice daily with one episode of amnesia event several days ago.  She also has a history of SIADH     PLAN: Continue Lamictal at current dose for now will refill when labs return We will get Lamictal level Will also get CMP to check sodium level per patient request F/U in 6 months Please remember, common seizure triggers are: Sleep deprivation, dehydration, overheating, stress, hypoglycemia or skipping meals, certain medications or excessive alcohol use, especially stopping alcohol abruptly if you have had heavy alcohol use before (aka alcohol withdrawal seizure). If you have a prolonged seizure over 2-5 minutes or back to back seizures, call or have someone call 911 or take you to the nearest emergency room. You cannot drive a car or operate any other machinery or vehicle within 6 months of a seizure. Please do not swim alone or take a tub bath for safety.  Take your medicine for seizure prevention regularly and do not skip doses or stop medication abruptly and tone are told to do so by your healthcare provider. Try to get a refill on your antiepileptic medication ahead of time,  so you are not at risk of running out. If you run out of the seizure medication and do not have a refill at hand she may run into medication withdrawal seizures. Avoid taking Wellbutrin, narcotic pain medications and tramadol, as they can lower seizure threshold.  Dennie Bible, [facility], [facility], APRN  [facility] [address] [place], [place] [address] [phone]

## 2017-09-30 ENCOUNTER — Encounter: Payer: Self-pay | Admitting: Nurse Practitioner

## 2017-09-30 ENCOUNTER — Ambulatory Visit: Payer: BLUE CROSS/BLUE SHIELD | Admitting: Nurse Practitioner

## 2017-09-30 VITALS — BP 133/86 | HR 68 | Ht 68.5 in | Wt 125.6 lb

## 2017-09-30 DIAGNOSIS — R569 Unspecified convulsions: Secondary | ICD-10-CM

## 2017-09-30 DIAGNOSIS — G40909 Epilepsy, unspecified, not intractable, without status epilepticus: Secondary | ICD-10-CM

## 2017-09-30 DIAGNOSIS — Z5181 Encounter for therapeutic drug level monitoring: Secondary | ICD-10-CM | POA: Insufficient documentation

## 2017-09-30 NOTE — Progress Notes (Signed)
I have reviewed and agreed above plan. 

## 2017-09-30 NOTE — Patient Instructions (Signed)
Continue Lamictal at current dose for now  We will get Lamictal level F/U in 6 months Please remember, common seizure triggers are: Sleep deprivation, dehydration, overheating, stress, hypoglycemia or skipping meals, certain medications or excessive alcohol use, especially stopping alcohol abruptly if you have had heavy alcohol use before (aka alcohol withdrawal seizure). If you have a prolonged seizure over 2-5 minutes or back to back seizures, call or have someone call 911 or take you to the nearest emergency room. You cannot drive a car or operate any other machinery or vehicle within 6 months of a seizure. Please do not swim alone or take a tub bath for safety.  Take your medicine for seizure prevention regularly and do not skip doses or stop medication abruptly and tone are told to do so by your healthcare provider. Try to get a refill on your antiepileptic medication ahead of time, so you are not at risk of running out. If you run out of the seizure medication and do not have a refill at hand she may run into medication withdrawal seizures. Avoid taking Wellbutrin, narcotic pain medications and tramadol, as they can lower seizure threshold.

## 2017-10-03 ENCOUNTER — Other Ambulatory Visit: Payer: Self-pay | Admitting: Nurse Practitioner

## 2017-10-03 LAB — COMPREHENSIVE METABOLIC PANEL
ALBUMIN: 4.7 g/dL (ref 3.6–4.8)
ALK PHOS: 60 IU/L (ref 39–117)
ALT: 12 IU/L (ref 0–32)
AST: 18 IU/L (ref 0–40)
Albumin/Globulin Ratio: 1.8 (ref 1.2–2.2)
BILIRUBIN TOTAL: 0.3 mg/dL (ref 0.0–1.2)
BUN / CREAT RATIO: 18 (ref 12–28)
BUN: 11 mg/dL (ref 8–27)
CHLORIDE: 91 mmol/L — AB (ref 96–106)
CO2: 27 mmol/L (ref 20–29)
CREATININE: 0.61 mg/dL (ref 0.57–1.00)
Calcium: 10.8 mg/dL — ABNORMAL HIGH (ref 8.7–10.3)
GFR calc Af Amer: 112 mL/min/{1.73_m2} (ref >59)
GFR calc non Af Amer: 97 mL/min/{1.73_m2} (ref >59)
GLUCOSE: 91 mg/dL (ref 65–99)
Globulin, Total: 2.6 g/dL (ref 1.5–4.5)
Potassium: 5.1 mmol/L (ref 3.5–5.2)
SODIUM: 133 mmol/L — AB (ref 134–144)
Total Protein: 7.3 g/dL (ref 6.0–8.5)

## 2017-10-03 LAB — LAMOTRIGINE LEVEL: Lamotrigine Lvl: 5.8 ug/mL (ref 2.0–20.0)

## 2017-10-03 MED ORDER — LAMOTRIGINE 100 MG PO TABS: 100.0000 mg | ORAL_TABLET | Freq: Two times a day (BID) | ORAL | 3 refills | Status: DC

## 2017-10-04 ENCOUNTER — Telehealth: Payer: Self-pay | Admitting: *Deleted

## 2017-10-04 NOTE — Telephone Encounter (Signed)
Spoke with patient and informed her that her Lamictal level is in the therapeutic. Advised she continue the same dose. Informed her the sodium level is stable at 133. She stated she had seen the results in her my chart, asked if it was okay that Lamictal is on low side of range. This RN advised her that is fine, continue med as prescribed. She stated she would monitor herself, call for any questions, concerns. She verbalized understanding, appreciation of call.

## 2017-11-01 DIAGNOSIS — H401131 Primary open-angle glaucoma, bilateral, mild stage: Secondary | ICD-10-CM | POA: Diagnosis not present

## 2017-12-21 DIAGNOSIS — H401131 Primary open-angle glaucoma, bilateral, mild stage: Secondary | ICD-10-CM | POA: Diagnosis not present

## 2017-12-21 DIAGNOSIS — Z01818 Encounter for other preprocedural examination: Secondary | ICD-10-CM | POA: Diagnosis not present

## 2017-12-21 DIAGNOSIS — H25813 Combined forms of age-related cataract, bilateral: Secondary | ICD-10-CM | POA: Diagnosis not present

## 2018-01-02 DIAGNOSIS — H401131 Primary open-angle glaucoma, bilateral, mild stage: Secondary | ICD-10-CM | POA: Diagnosis not present

## 2018-01-02 DIAGNOSIS — H25812 Combined forms of age-related cataract, left eye: Secondary | ICD-10-CM | POA: Diagnosis not present

## 2018-01-02 DIAGNOSIS — H401121 Primary open-angle glaucoma, left eye, mild stage: Secondary | ICD-10-CM | POA: Diagnosis not present

## 2018-01-02 DIAGNOSIS — H409 Unspecified glaucoma: Secondary | ICD-10-CM | POA: Diagnosis not present

## 2018-01-18 DIAGNOSIS — H25811 Combined forms of age-related cataract, right eye: Secondary | ICD-10-CM | POA: Diagnosis not present

## 2018-01-18 DIAGNOSIS — H401111 Primary open-angle glaucoma, right eye, mild stage: Secondary | ICD-10-CM | POA: Diagnosis not present

## 2018-01-18 DIAGNOSIS — H401131 Primary open-angle glaucoma, bilateral, mild stage: Secondary | ICD-10-CM | POA: Diagnosis not present

## 2018-01-18 DIAGNOSIS — H409 Unspecified glaucoma: Secondary | ICD-10-CM | POA: Diagnosis not present

## 2018-01-18 DIAGNOSIS — H25812 Combined forms of age-related cataract, left eye: Secondary | ICD-10-CM | POA: Diagnosis not present

## 2018-01-22 IMAGING — CR DG CHEST 1V PORT
1 series · 1 of 1 positions shown · non-contrast
Comparison: 03/25/2016

CLINICAL DATA: Leukocytosis.  Question aspiration.

EXAM:
PORTABLE CHEST 1 VIEW

[portable]
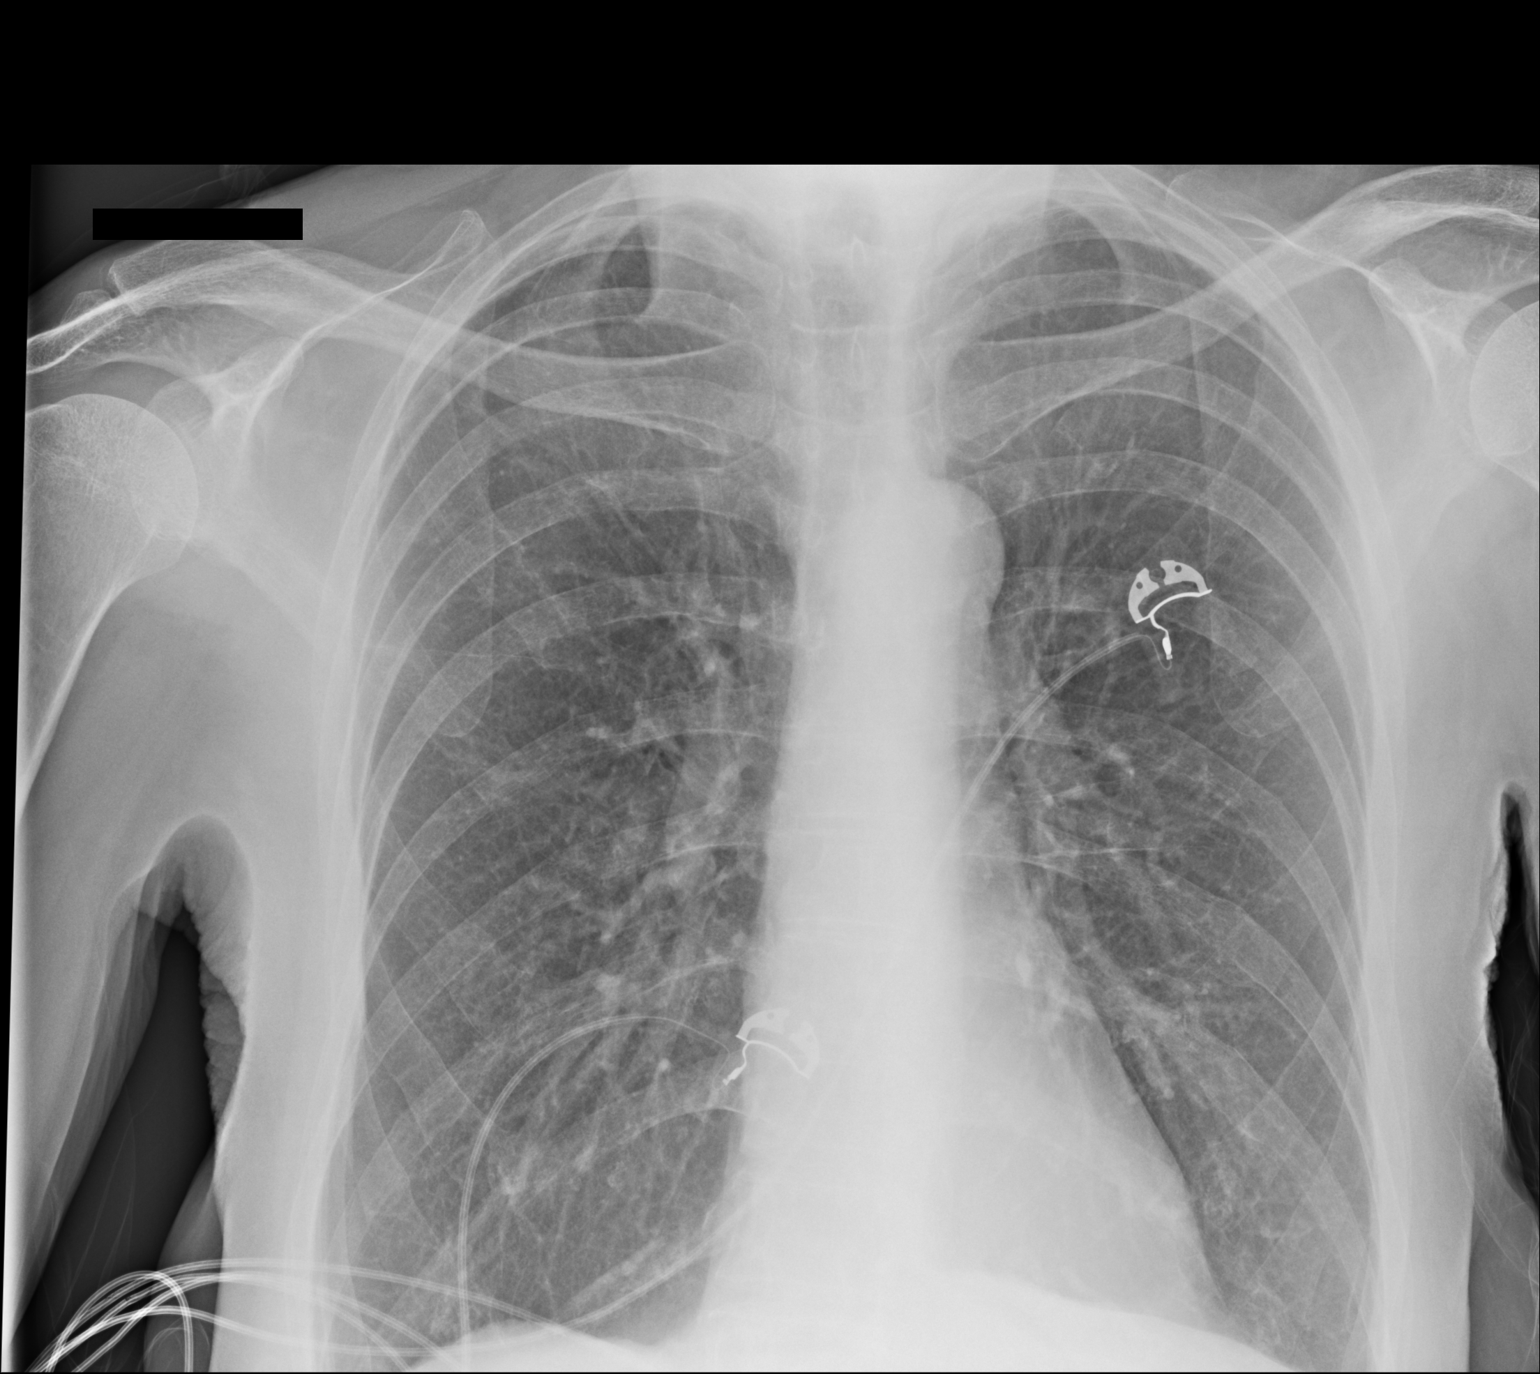

[1 of 1 positions shown; findings below may reference images not displayed]

FINDINGS: There is hyperinflation of the lungs compatible with COPD. Heart and
mediastinal contours are within normal limits. No focal opacities or
effusions. No acute bony abnormality.
IMPRESSION: COPD.  No active disease.

## 2018-02-20 DIAGNOSIS — E210 Primary hyperparathyroidism: Secondary | ICD-10-CM | POA: Diagnosis not present

## 2018-02-20 DIAGNOSIS — M810 Age-related osteoporosis without current pathological fracture: Secondary | ICD-10-CM | POA: Diagnosis not present

## 2018-02-20 DIAGNOSIS — Z1231 Encounter for screening mammogram for malignant neoplasm of breast: Secondary | ICD-10-CM | POA: Diagnosis not present

## 2018-02-20 DIAGNOSIS — R12 Heartburn: Secondary | ICD-10-CM | POA: Diagnosis not present

## 2018-02-20 DIAGNOSIS — E222 Syndrome of inappropriate secretion of antidiuretic hormone: Secondary | ICD-10-CM | POA: Diagnosis not present

## 2018-02-20 LAB — HM MAMMOGRAPHY

## 2018-02-21 ENCOUNTER — Encounter: Payer: Self-pay | Admitting: Family Medicine

## 2018-03-20 ENCOUNTER — Other Ambulatory Visit: Payer: Self-pay | Admitting: Family Medicine

## 2018-03-21 ENCOUNTER — Other Ambulatory Visit: Payer: BLUE CROSS/BLUE SHIELD

## 2018-03-22 ENCOUNTER — Other Ambulatory Visit (INDEPENDENT_AMBULATORY_CARE_PROVIDER_SITE_OTHER): Payer: BLUE CROSS/BLUE SHIELD

## 2018-03-22 ENCOUNTER — Telehealth: Payer: Self-pay | Admitting: Family Medicine

## 2018-03-22 DIAGNOSIS — M810 Age-related osteoporosis without current pathological fracture: Secondary | ICD-10-CM

## 2018-03-22 DIAGNOSIS — R7303 Prediabetes: Secondary | ICD-10-CM

## 2018-03-22 DIAGNOSIS — E782 Mixed hyperlipidemia: Secondary | ICD-10-CM

## 2018-03-22 LAB — LIPID PANEL
CHOL/HDL RATIO: 2
Cholesterol: 226 mg/dL — ABNORMAL HIGH (ref 0–200)
HDL: 127.5 mg/dL (ref >39.00)
LDL CALC: 89 mg/dL (ref 0–99)
NONHDL: 98.6
Triglycerides: 49 mg/dL (ref 0.0–149.0)
VLDL: 9.8 mg/dL (ref 0.0–40.0)

## 2018-03-22 LAB — COMPREHENSIVE METABOLIC PANEL
ALT: 12 U/L (ref 0–35)
AST: 17 U/L (ref 0–37)
Albumin: 4.6 g/dL (ref 3.5–5.2)
Alkaline Phosphatase: 47 U/L (ref 39–117)
BILIRUBIN TOTAL: 0.5 mg/dL (ref 0.2–1.2)
BUN: 14 mg/dL (ref 6–23)
CO2: 29 meq/L (ref 19–32)
CREATININE: 0.76 mg/dL (ref 0.40–1.20)
Calcium: 10.3 mg/dL (ref 8.4–10.5)
Chloride: 93 mEq/L — ABNORMAL LOW (ref 96–112)
GFR: 81.83 mL/min (ref >60.00)
GLUCOSE: 103 mg/dL — AB (ref 70–99)
Potassium: 4.9 mEq/L (ref 3.5–5.1)
Sodium: 130 mEq/L — ABNORMAL LOW (ref 135–145)
Total Protein: 7.1 g/dL (ref 6.0–8.3)

## 2018-03-22 LAB — VITAMIN D 25 HYDROXY (VIT D DEFICIENCY, FRACTURES): VITD: 58.8 ng/mL (ref 30.00–100.00)

## 2018-03-22 LAB — MAGNESIUM: MAGNESIUM: 1.8 mg/dL (ref 1.5–2.5)

## 2018-03-22 LAB — HEMOGLOBIN A1C: Hgb A1c MFr Bld: 5.7 % (ref 4.6–6.5)

## 2018-03-22 NOTE — Telephone Encounter (Signed)
-----   Message from Lendon Collar, RT sent at 03/14/2018  9:46 AM EDT ----- Regarding: Lab orders for Wednesday 03/22/18 Please enter CPE lab orders for 03/22/18. Thanks!

## 2018-03-28 ENCOUNTER — Encounter: Payer: Self-pay | Admitting: Family Medicine

## 2018-03-28 ENCOUNTER — Ambulatory Visit (INDEPENDENT_AMBULATORY_CARE_PROVIDER_SITE_OTHER): Payer: BLUE CROSS/BLUE SHIELD | Admitting: Family Medicine

## 2018-03-28 VITALS — BP 130/82 | HR 70 | Temp 98.6°F | Ht 68.5 in | Wt 123.5 lb

## 2018-03-28 DIAGNOSIS — E222 Syndrome of inappropriate secretion of antidiuretic hormone: Secondary | ICD-10-CM

## 2018-03-28 DIAGNOSIS — R569 Unspecified convulsions: Secondary | ICD-10-CM

## 2018-03-28 DIAGNOSIS — M810 Age-related osteoporosis without current pathological fracture: Secondary | ICD-10-CM | POA: Diagnosis not present

## 2018-03-28 DIAGNOSIS — Z23 Encounter for immunization: Secondary | ICD-10-CM

## 2018-03-28 DIAGNOSIS — E782 Mixed hyperlipidemia: Secondary | ICD-10-CM

## 2018-03-28 DIAGNOSIS — I10 Essential (primary) hypertension: Secondary | ICD-10-CM

## 2018-03-28 DIAGNOSIS — Z0000 Encounter for general adult medical examination without abnormal findings: Secondary | ICD-10-CM

## 2018-03-28 DIAGNOSIS — R7303 Prediabetes: Secondary | ICD-10-CM | POA: Diagnosis not present

## 2018-03-28 NOTE — Assessment & Plan Note (Signed)
Stable

## 2018-03-28 NOTE — Patient Instructions (Signed)
Make sure eating regularly and increase protein in diet.

## 2018-03-28 NOTE — Assessment & Plan Note (Signed)
Follwed by ENDO, stable.

## 2018-03-28 NOTE — Progress Notes (Signed)
   Subjective:    Patient ID: Candice Gray, female    DOB: 02-28-1956, 62 y.o.   MRN: [id number]  HPI  The patient is here for annual wellness exam and preventative care.    She has not been doing well... Her son committed suicide 6 months ago. He was an alcoholic. She feels she is grieving normally.  Family is very supportive.  She is able to do what she needs to do,  She is sleeping at night.  She does have a lot of anger about his death.  She is not currently interested in counseling or medication.   SIADH followed by ENDO. Osteoporosis: On reclast  Hypertension:   At goal on amlodipine, metoprolol. Using medication without problems or lightheadedness:  none Chest pain with exertion:none Edema:none Short of breath: one Average home BPs: Other issues:  Prediabetes  Stable control. Lab Results  Component Value Date   HGBA1C 5.7 03/22/2018    Elevated Cholesterol:  LDL at goal < 100. Lab Results  Component Value Date   CHOL 226 (H) 03/22/2018   HDL 127.50 03/22/2018   LDLCALC 89 03/22/2018   LDLDIRECT 74.6 12/31/2011   TRIG 49.0 03/22/2018   CHOLHDL 2 03/22/2018  Using medications without problems: Muscle aches:  Diet compliance: she is eating well now..diId lose weight with loss of son.  Wt Readings from Last 3 Encounters:  03/28/18 123 lb 8 oz (56 kg)  09/30/17 125 lb 9.6 oz (57 kg)  04/06/17 131 lb 12.8 oz (59.8 kg)  Body mass index is 18.5 kg/m. Exercise: walking. Other complaints:   Social History /Family History/Past Medical History reviewed in detail and updated in EMR if needed. Blood pressure 130/82, pulse 70, temperature 98.6 F (37 C), temperature source Oral, height 5' 8.5" (1.74 m), weight 123 lb 8 oz (56 kg).  Review of Systems     Objective:   Physical Exam        Assessment & Plan:  The patient's preventative maintenance and recommended screening tests for an annual wellness exam were reviewed in full today. Brought up to date  unless services declined.  Counselled on the importance of diet, exercise, and its role in overall health and mortality. The patient's FH and SH was reviewed, including their home life, tobacco status, and drug and alcohol status.   Last DEXA 2015 worsening osteopenia, stopped boniva. 2017 osteoporosis ENDO treating.. Started reclast Reclast 2019 Last colon: 2011 was bengin polyps.. No repeat screen in this way given small caliber colon. Likely in 10 years ifob.  UTD with vaccines Td 2010,  given flu, shingles Mammo nml 9/2098nml Pap q 5years, last nml in 2016, neg HPV,  DVE not indicated asymptomatic and no family history. Counseled against marijuana use.  Nonsmoker Hep C: neg

## 2018-03-28 NOTE — Assessment & Plan Note (Signed)
At goal on no med. 

## 2018-03-28 NOTE — Assessment & Plan Note (Signed)
Well controlled. Continue current medication.  

## 2018-03-28 NOTE — Assessment & Plan Note (Signed)
Followed by ENDO on reclast.

## 2018-03-28 NOTE — Assessment & Plan Note (Signed)
No recent seizures

## 2018-04-06 ENCOUNTER — Other Ambulatory Visit: Payer: Self-pay | Admitting: Family Medicine

## 2018-04-07 ENCOUNTER — Encounter (HOSPITAL_COMMUNITY): Payer: Self-pay

## 2018-04-07 ENCOUNTER — Ambulatory Visit (HOSPITAL_COMMUNITY)
Admission: RE | Admit: 2018-04-07 | Discharge: 2018-04-07 | Disposition: A | Discharge status: Home / Self Care | Payer: BLUE CROSS/BLUE SHIELD | Source: Ambulatory Visit | Attending: Internal Medicine | Admitting: Internal Medicine

## 2018-04-07 DIAGNOSIS — M810 Age-related osteoporosis without current pathological fracture: Secondary | ICD-10-CM | POA: Insufficient documentation

## 2018-04-07 HISTORY — DX: Seborrheic dermatitis, unspecified: L21.9

## 2018-04-07 MED ORDER — ZOLEDRONIC ACID 5 MG/100ML IV SOLN
5.0000 mg | Freq: Once | INTRAVENOUS | Status: AC
  Administered 2018-04-07: 5 mg via INTRAVENOUS
  Filled 2018-04-07: qty 100

## 2018-04-07 MED ORDER — SODIUM CHLORIDE 0.9 % IV SOLN
INTRAVENOUS | Status: AC
  Administered 2018-04-07: 13:00:00 via INTRAVENOUS

## 2018-04-07 NOTE — Discharge Instructions (Signed)
Zoledronic Acid injection (Paget's Disease, Osteoporosis)/ Reclast °What is this medicine? °ZOLEDRONIC ACID (ZOE le dron ik AS id) lowers the amount of calcium loss from bone. It is used to treat Paget's disease and osteoporosis in women. °This medicine may be used for other purposes; ask your health care provider or pharmacist if you have questions. °COMMON BRAND NAME(S): Reclast, Zometa °What should I tell my health care provider before I take this medicine? °They need to know if you have any of these conditions: °-aspirin-sensitive asthma °-cancer, especially if you are receiving medicines used to treat cancer °-dental disease or wear dentures °-infection °-kidney disease °-low levels of calcium in the blood °-past surgery on the parathyroid gland or intestines °-receiving corticosteroids like dexamethasone or prednisone °-an unusual or allergic reaction to zoledronic acid, other medicines, foods, dyes, or preservatives °-pregnant or trying to get pregnant °-breast-feeding °How should I use this medicine? °This medicine is for infusion into a vein. It is given by a health care professional in a hospital or clinic setting. °Talk to your pediatrician regarding the use of this medicine in children. This medicine is not approved for use in children. °Overdosage: If you think you have taken too much of this medicine contact a poison control center or emergency room at once. °NOTE: This medicine is only for you. Do not share this medicine with others. °What if I miss a dose? °It is important not to miss your dose. Call your doctor or health care professional if you are unable to keep an appointment. °What may interact with this medicine? °-certain antibiotics given by injection °-NSAIDs, medicines for pain and inflammation, like ibuprofen or naproxen °-some diuretics like bumetanide, furosemide °-teriparatide °This list may not describe all possible interactions. Give your health care provider a list of all the  medicines, herbs, non-prescription drugs, or dietary supplements you use. Also tell them if you smoke, drink alcohol, or use illegal drugs. Some items may interact with your medicine. °What should I watch for while using this medicine? °Visit your doctor or health care professional for regular checkups. It may be some time before you see the benefit from this medicine. Do not stop taking your medicine unless your doctor tells you to. Your doctor may order blood tests or other tests to see how you are doing. °Women should inform their doctor if they wish to become pregnant or think they might be pregnant. There is a potential for serious side effects to an unborn child. Talk to your health care professional or pharmacist for more information. °You should make sure that you get enough calcium and vitamin D while you are taking this medicine. Discuss the foods you eat and the vitamins you take with your health care professional. °Some people who take this medicine have severe bone, joint, and/or muscle pain. This medicine may also increase your risk for jaw problems or a broken thigh bone. Tell your doctor right away if you have severe pain in your jaw, bones, joints, or muscles. Tell your doctor if you have any pain that does not go away or that gets worse. °Tell your dentist and dental surgeon that you are taking this medicine. You should not have major dental surgery while on this medicine. See your dentist to have a dental exam and fix any dental problems before starting this medicine. Take good care of your teeth while on this medicine. Make sure you see your dentist for regular follow-up appointments. °What side effects may I notice from receiving this   medicine? °Side effects that you should report to your doctor or health care professional as soon as possible: °-allergic reactions like skin rash, itching or hives, swelling of the face, lips, or tongue °-anxiety, confusion, or depression °-breathing  problems °-changes in vision °-eye pain °-feeling faint or lightheaded, falls °-jaw pain, especially after dental work °-mouth sores °-muscle cramps, stiffness, or weakness °-redness, blistering, peeling or loosening of the skin, including inside the mouth °-trouble passing urine or change in the amount of urine °Side effects that usually do not require medical attention (report to your doctor or health care professional if they continue or are bothersome): °-bone, joint, or muscle pain °-constipation °-diarrhea °-fever °-hair loss °-irritation at site where injected °-loss of appetite °-nausea, vomiting °-stomach upset °-trouble sleeping °-trouble swallowing °-weak or tired °This list may not describe all possible side effects. Call your doctor for medical advice about side effects. You may report side effects to FDA at 1-800-FDA-1088. °Where should I keep my medicine? °This drug is given in a hospital or clinic and will not be stored at home. °NOTE: This sheet is a summary. It may not cover all possible information. If you have questions about this medicine, talk to your doctor, pharmacist, or health care provider. °© 2018 [employer] (2013-10-06 14:19:57) ° °

## 2018-04-10 NOTE — Progress Notes (Signed)
[facility]  PATIENT: Candice Gray DOB: 1956/01/21   REASON FOR VISIT: Follow-up for seizure disorder HISTORY FROM: Patient and husband   HISTORY OF PRESENT ILLNESS: Candice Gray is a 62 years old right-handed female, seen in refer by her primary care doctor  Jinny Sanders for evaluation of seizure on March 25 2016, initial evaluation was on April 01 2016.  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  She was put on water restriction 64 ounces daily ever since, is under close supervision of endocrinologist, she reported frequent thirsty recently, has liberated her water intake some,  On March 25 2016 around 10:00 in the morning, she noticed mild dizziness, sat down in the chair, lost consciousness, woke up on ambulance confused, she has a witnessed tonic-clonic seizure activity, her body becoming rigid, mouth open, eyes rolled back, lasting for a few minutes, followed by post event confusion.   I personally reviewed MRI of the brain on March 25 2016, mild generalized atrophy no acute abnormality   I reviewed her laboratory evaluation in November, glucose was 123, sodium was 129, potassium was 3.2, chloride was 90, glucose was 118, creatinine 0.78, WBC was mildly elevated 13, hemoglobin was 15 point 1, magnesium 1.6, A1c was 5.5, normal TSH, BNP was 112, magnesium 1.8, UDS was positive for marijuana, alcohol was less than 5, negative hepatitis C,  EEG showed: posterior dominant rhythm of 9 Hz reactive eye opening and closure., there are epileptiform discharges per description with maximum at F7 and T3. These do not evolve into electrographic seizures. No sleep was recorded.   She was treated with Keppra 500 mg 3 times a day, she complains of being anxious about her health, get irritated  easily, she does smoke marijuana regularly, now she has stopped smoking.  UPDATE Sep 29 2016:YY She is doing well, able to tolerate lamotrigine 100 mg twice a day much better, no recurrent seizure,  UPDATE  5/10/2019CM Candice Gray, 62 year old female returns for follow-up with history of seizure disorder.  She had one episode of amnesia-like event recently possibly a seizure also an episode followed by fogginess and high heart rate but  she had not eaten.  She is currently on Lamictal 100 mg twice daily.  She has a history of SIADH and she says when her sodium level gets low she can also have confusion.  It has not been checked since last year by PCP.  She returns for reevaluation UPDATE 11/19/2019CM Candice Gray, 62 year old female returns for follow-up with history of seizure disorder.  She has had several amnesic-like events in the last 6 months followed by some mental fogginess.  She also reports that she has been under extreme stress.  Her son took his life several months ago.  She claims she has a strong support group but bereavement therapy was also encouraged for her and her husband.  She remains on Lamictal 100 mg twice daily.  Her last level was 5.8.  She has a history of SIADH, her most recent sodium level by primary care was 130.  She returns for reevaluation   REVIEW OF SYSTEMS: Full 14 system review of systems performed and notable only for those listed, all others are neg:  Constitutional: neg  Cardiovascular: neg Ear/Nose/Throat: neg  Skin: neg Eyes: neg Respiratory: neg Gastroitestinal: neg  Hematology/Lymphatic:  neg  Endocrine: Excessive thirst, intolerance to cold Musculoskeletal:neg Allergy/Immunology: neg Neurological: neg Psychiatric: neg Sleep : neg   ALLERGIES: Allergies  Allergen Reactions  . Azelastine Hcl     REACTION: increased bp  . Depo-Medrol [Methylprednisolone] Itching  . Miralax [Polyethylene Glycol] Itching  . Red Dye Other (See Comments)    Contact  Dermatitis  . Zyban [Bupropion] Swelling    HOME MEDICATIONS: Outpatient Medications Prior to Visit  Medication Sig Dispense Refill  . amLODipine (NORVASC) 2.5 MG tablet TAKE 1 TABLET BY MOUTH TWICE DAILY 180 tablet 3  . Biotin 1 MG CAPS Take 1 tablet by mouth daily.    . Calcium-Vitamin D (CALTRATE 600 PLUS-VIT D PO) Take 1 tablet by mouth daily.    . Cholecalciferol (VITAMIN D-3 PO) Take 1 capsule by mouth daily.    . hydrocortisone 2.5 % cream Apply 1 application 2 (two) times daily topically.    . lamoTRIgine (LAMICTAL) 100 MG tablet Take 1 tablet (100 mg total) by mouth 2 (two) times daily. 180 tablet 3  . latanoprost (XALATAN) 0.005 % ophthalmic solution Place 1 drop into both eyes at bedtime.    . metoprolol tartrate (LOPRESSOR) 25 MG tablet TAKE 1/2 TABLET BY MOUTH TWICE DAILY 90 tablet 0  . triamcinolone cream (KENALOG) 0.5 % Apply 1 application topically 2 (two) times daily as needed. 30 g 0  . vitamin C (ASCORBIC ACID) 500 MG tablet Take 500 mg by mouth daily.    . zoledronic acid (RECLAST) 5 MG/100ML SOLN injection Inject 5 mg into the vein once.     No facility-administered medications prior to visit.     PAST MEDICAL HISTORY: Past Medical History:  Diagnosis Date  . Anxiety   . Dermatitis, seborrheic 2018  . Deviated nasal septum   . Diverticulitis   . Diverticulosis   . HTN (hypertension)   . Hyperlipemia   . Hyperplastic colon polyp   . Hyponatremia   . Hyponatremia   . MVP (mitral valve prolapse)   . Raynaud phenomenon   . Seizures ([place])   . SIADH (syndrome of inappropriate ADH production) ([place])     PAST SURGICAL HISTORY: Past Surgical History:  Procedure Laterality Date  . ABDOMINAL HYSTERECTOMY    . CATARACT EXTRACTION, BILATERAL  2019  . CRYOTHERAPY     for cervical dysplasia  . NM MYOCAR PERF WALL MOTION  11/2007   bruce myoview; perfusion defect in anterior myocardium (breast attenuation); post-stress EF 77%; normal, low risk study   .  TRANSTHORACIC ECHOCARDIOGRAM  11/2007   EF=>55%; mild MR; trace TR  . TUBAL LIGATION      FAMILY HISTORY: Family History  Problem Relation Age of Onset  . Hyperlipidemia Father   . Hypertension Father   . Diabetes Father   . Atrial fibrillation Father   . Diabetes Paternal Grandmother   . Cancer Paternal Grandfather        brain tumor  . Hyperlipidemia Other   . Hypertension Other   . Coronary artery disease Maternal Grandmother   . Diabetes Maternal Grandmother   . Hyperlipidemia Brother   . Transient ischemic attack Mother   . Colon cancer Neg Hx     SOCIAL HISTORY: Social History   Socioeconomic History  . Marital status: Married    Spouse name: Legrand Como  . Number of children: 2  . Years of education: College  . Highest education level: Not on file  Occupational History  . Occupation: sub Public relations account executive business  Employer: UNEMPLOYED  Social Needs  . Financial resource strain: Not on file  . Food insecurity:    Worry: Not on file    Inability: Not on file  . Transportation needs:    Medical: Not on file    Non-medical: Not on file  Tobacco Use  . Smoking status: Former Smoker    Types: Cigarettes    Last attempt to quit: 06/05/1998    Years since quitting: 19.8  . Smokeless tobacco: Never Used  Substance and Sexual Activity  . Alcohol use: Yes    Alcohol/week: 14.0 standard drinks    Types: 14 Cans of beer per week    Comment: daily  . Drug use: Yes    Types: Marijuana    Comment: 04/11/18 yes marijuana  . Sexual activity: Not on file  Lifestyle  . Physical activity:    Days per week: Not on file    Minutes per session: Not on file  . Stress: Not on file  Relationships  . Social connections:    Talks on phone: Not on file    Gets together: Not on file    Attends religious service: Not on file    Active member of club or organization: Not on file    Attends meetings of clubs or organizations: Not on file    Relationship status: Not  on file  . Intimate partner violence:    Fear of current or ex partner: Not on file    Emotionally abused: Not on file    Physically abused: Not on file    Forced sexual activity: Not on file  Other Topics Concern  . Not on file  Social History Narrative   Lives at husband and 2 sons   Right-handed   Regular exercise--yes   Caffeine: daily coffee   Diet: fruits and veggies     PHYSICAL EXAM  Vitals:   04/11/18 0948  BP: (!) 142/80  Pulse: 75  Weight: 127 lb (57.6 kg)  Height: 5' 8.5" (1.74 m)   Body mass index is 19.03 kg/m.  Generalized: Well developed, in no acute distress  Head: normocephalic and atraumatic,. Oropharynx benign  Neck: Supple,  Musculoskeletal: No deformity   Neurological examination   Mentation: Alert oriented to time, place, history taking. Attention span and concentration appropriate. Recent and remote memory intact.  Follows all commands speech and language fluent.   Cranial nerve II-XII: Pupils were equal round reactive to light extraocular movements were full, visual field were full on confrontational test. Facial sensation and strength were normal. hearing was intact to finger rubbing bilaterally. Uvula tongue midline. head turning and shoulder shrug were normal and symmetric.Tongue protrusion into cheek strength was normal. Motor: normal bulk and tone, full strength in the BUE, BLE,  Sensory: normal and symmetric to light touch,  Coordination: finger-nose-finger,  no dysmetria Reflexes: Brachioradialis 2/2, biceps 2/2, triceps 2/2, patellar 2/2, Achilles 2/2, plantar responses were flexor bilaterally. Gait and Station: Rising up from seated position without assistance, normal stance,  moderate stride, good arm swing, smooth turning, can heel and toe walk without difficulty. Tandem gait is steady  DIAGNOSTIC DATA (LABS, IMAGING, TESTING) - I reviewed patient records, labs, notes, testing and imaging myself where available.  Lab Results    Component Value Date   WBC 13.0 (H) 03/27/2016   HGB 15.1 (H) 03/27/2016   HCT 43.8 03/27/2016   MCV 97.8 03/27/2016   PLT 237 03/27/2016      Component Value Date/Time  NA 130 (L) 03/22/2018 1116   NA 133 (L) 09/30/2017 0922   K 4.9 03/22/2018 1116   CL 93 (L) 03/22/2018 1116   CO2 29 03/22/2018 1116   GLUCOSE 103 (H) 03/22/2018 1116   BUN 14 03/22/2018 1116   BUN 11 09/30/2017 0922   CREATININE 0.76 03/22/2018 1116   CALCIUM 10.3 03/22/2018 1116   PROT 7.1 03/22/2018 1116   PROT 7.3 09/30/2017 0922   ALBUMIN 4.6 03/22/2018 1116   ALBUMIN 4.7 09/30/2017 0922   AST 17 03/22/2018 1116   ALT 12 03/22/2018 1116   ALKPHOS 47 03/22/2018 1116   BILITOT 0.5 03/22/2018 1116   BILITOT 0.3 09/30/2017 0922   GFRNONAA 97 09/30/2017 0922   GFRAA 112 09/30/2017 0922   Lab Results  Component Value Date   CHOL 226 (H) 03/22/2018   HDL 127.50 03/22/2018   LDLCALC 89 03/22/2018   LDLDIRECT 74.6 12/31/2011   TRIG 49.0 03/22/2018   CHOLHDL 2 03/22/2018   Lab Results  Component Value Date   HGBA1C 5.7 03/22/2018   Lab Results  Component Value Date   VITAMINB12 390 12/20/2012   Lab Results  Component Value Date   TSH 1.824 03/25/2016      ASSESSMENT AND PLAN  62 y.o. year old female with history of seizure disorder March 25, 2016,MRI of the brain showed generalized atrophy, she failed Keppra due to moodiness anxiety EEG showed focal abnormality at anterior temporal lobe,.  She is currently on Lamictal 100 mg twice daily with several episodes of amnesia event since last seen. Increased stress due to death of son since last seen.  She also has a history of SIADH.  Most recent sodium level 130     PLAN: Continue Lamictal at current dose for now does not need refills We will get Lamictal level Reviewed recent labs from PCP F/U in 6 months Recommend bereavement therapy for her recent loss in addition to having good support group.  Patient allowed to ventilate her  feelings  Dennie Bible, [facility], [facility], APRN  [facility] [address] [place], [place] [address] [phone]

## 2018-04-11 ENCOUNTER — Encounter: Payer: Self-pay | Admitting: Nurse Practitioner

## 2018-04-11 ENCOUNTER — Ambulatory Visit: Payer: BLUE CROSS/BLUE SHIELD | Admitting: Nurse Practitioner

## 2018-04-11 VITALS — BP 142/80 | HR 75 | Ht 68.5 in | Wt 127.0 lb

## 2018-04-11 DIAGNOSIS — Z5181 Encounter for therapeutic drug level monitoring: Secondary | ICD-10-CM | POA: Diagnosis not present

## 2018-04-11 DIAGNOSIS — R569 Unspecified convulsions: Secondary | ICD-10-CM

## 2018-04-11 NOTE — Patient Instructions (Signed)
Continue Lamictal at current dose for now does not need refills We will get Lamictal level Reviewed recent labs from PCP F/U in 6 months

## 2018-04-13 ENCOUNTER — Telehealth: Payer: Self-pay | Admitting: *Deleted

## 2018-04-13 LAB — LAMOTRIGINE LEVEL: Lamotrigine Lvl: 10.9 ug/mL (ref 2.0–20.0)

## 2018-04-13 NOTE — Telephone Encounter (Signed)
LMVM at home (ok per DPR) that lab results back and lamotrigine level was excellent at 10.9 per CM/NP.  She is to continue same dose of lamictal.   She is to call back if questions.

## 2018-04-13 NOTE — Telephone Encounter (Signed)
-----   Message from Dennie Bible, NP sent at 04/13/2018 10:54 AM EST ----- Excellent level of Lamictal at 10.9.  Continue same dose please call the patient

## 2018-05-02 DIAGNOSIS — H401131 Primary open-angle glaucoma, bilateral, mild stage: Secondary | ICD-10-CM | POA: Diagnosis not present

## 2018-06-17 ENCOUNTER — Other Ambulatory Visit: Payer: Self-pay | Admitting: Family Medicine

## 2018-10-03 ENCOUNTER — Other Ambulatory Visit: Payer: Self-pay | Admitting: *Deleted

## 2018-10-03 MED ORDER — LAMOTRIGINE 100 MG PO TABS: 100.0000 mg | ORAL_TABLET | Freq: Two times a day (BID) | ORAL | 1 refills | Status: DC

## 2018-10-18 ENCOUNTER — Telehealth: Payer: Self-pay | Admitting: Nurse Practitioner

## 2018-10-18 NOTE — Telephone Encounter (Signed)
5-27 Pt called and gave verbal consent to file insurance for a [email or web address] vv E-mail confirmed as:[email or web address]  Pt understands that although there may be some limitations with this type of visit, we will take all precautions to reduce any security or privacy concerns.  Pt understands that this will be treated like an in office visit and we will file with pt's insurance, and there may be a patient responsible charge related to this service. *e-mail sent*

## 2018-10-24 DIAGNOSIS — H16103 Unspecified superficial keratitis, bilateral: Secondary | ICD-10-CM | POA: Diagnosis not present

## 2018-10-25 ENCOUNTER — Encounter: Payer: Self-pay | Admitting: Neurology

## 2018-10-25 ENCOUNTER — Ambulatory Visit (INDEPENDENT_AMBULATORY_CARE_PROVIDER_SITE_OTHER): Payer: BC Managed Care – PPO | Admitting: Neurology

## 2018-10-25 ENCOUNTER — Other Ambulatory Visit: Payer: Self-pay

## 2018-10-25 DIAGNOSIS — R569 Unspecified convulsions: Secondary | ICD-10-CM | POA: Diagnosis not present

## 2018-10-25 NOTE — Progress Notes (Signed)
I have reviewed and agreed above plan. 

## 2018-10-25 NOTE — Progress Notes (Signed)
Virtual Visit via Video Note  I connected with Candice Gray on 10/25/18 at  9:45 AM EDT by a video enabled telemedicine application and verified that I am speaking with the correct person using two identifiers.  Location: Patient: At her home  Provider: In the office    I discussed the limitations of evaluation and management by telemedicine and the availability of in person appointments. The patient expressed understanding and agreed to proceed.  History of Present Illness: HISTORY OF PRESENT ILLNESS: Candice Gray a 63 years old right-handed female, seen in refer by her primary care doctor Jinny Sanders for evaluation of seizure on March 25 2016, initial evaluation was on April 01 2016.  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  She was put on water restriction 64 ounces daily ever since, is under close supervision of endocrinologist, she reported frequent thirsty recently, hasliberatedher water intake some,  On March 25 2016 around 10:00 in the morning, she noticed mild dizziness, sat down in the chair, lost consciousness, woke up on ambulance confused, she has a witnessed tonic-clonic seizure activity, her body becoming rigid, mouth open, eyes rolled back, lasting for a few minutes, followed by post event confusion.   I personally reviewed MRI of the brain on March 25 2016, mild generalized atrophy no acute abnormality   I reviewed her laboratory evaluation in November, glucose was 123, sodium was 129, potassium was 3.2, chloride was 90, glucose was 118, creatinine 0.78, WBC was mildly elevated 13, hemoglobin was 15 point 1, magnesium 1.6, A1c was 5.5, normal TSH, BNP was 112, magnesium 1.8, UDS was positive for marijuana, alcohol was less than 5, negative hepatitis C,  EEG showed: posterior  dominant rhythm of 9 Hz reactive eye opening and closure., there are epileptiform discharges per description with maximum at F7 and T3. These do not evolve into electrographic seizures. No sleep was recorded.   She was treated with Keppra 500 mg 3 times a day, she complains of being anxious about her health, get irritated easily, she does smoke marijuana regularly, now she has stopped smoking.  UPDATE Sep 29 2016:YY She is doing well, able to tolerate lamotrigine 100 mg twice a day much better, no recurrent seizure,  UPDATE  5/10/2019CM Candice Gray, 63 year old female returns for follow-up with history of seizure disorder.  She had one episode of amnesia-like event recently possibly a seizure also an episode followed by fogginess and high heart rate but  she had not eaten.  She is currently on Lamictal 100 mg twice daily.  She has a history of SIADH and she says when her sodium level gets low she can also have confusion.  It has not been checked since last year by PCP.  She returns for reevaluation UPDATE 11/19/2019CM Candice Gray, 63 year old female returns for follow-up with history of seizure disorder.  She has had several amnesic-like events in the last 6 months followed by some mental fogginess.  She also reports that she has been under extreme stress.  Her son took his life several months ago.  She claims she has a strong support group but bereavement therapy was also encouraged for her and her husband.  She remains on Lamictal 100 mg twice daily.  Her last level was 5.8.  She has a history of SIADH, her most recent  sodium level by primary care was 130.  She returns for reevaluation  Update October 25, 2018 SS: Candice Gray 63 year old female with history of seizure disorder.  Past EEG showed focal abnormality of the anterior temporal lobe, is currently taking Lamictal 100 mg twice daily.  Almost 1 year ago her son committed suicide, is doing better emotionally.  She has only had 1 seizure, November 2017.   She does have history of SIADH, she sees endocrinology.  She is retired, lives with her husband, is outdoors often.  Denies new problems or concerns.  She indicates she is tolerating Lamictal well.  She reports occasionally she will have some lightheadedness when she stands.   Observations/Objective: BP 120/88, HR 74 (per patient) Is alert and oriented, answers quickly, speech is clear and concise, very pleasant, follows commands, facial symmetry noted, no arm drift, gait is intact  Assessment and Plan: 1.  Seizure disorder  Overall she is doing very well.  She has not had any recurrent seizure.  She will continue taking Lamictal 100 mg twice daily.  I will check lab work.  I will send in a refill once her lab work results.   Follow Up Instructions: 6 months,  12/7 9:45 am   I discussed the assessment and treatment plan with the patient. The patient was provided an opportunity to ask questions and all were answered. The patient agreed with the plan and demonstrated an understanding of the instructions.   The patient was advised to call back or seek an in-person evaluation if the symptoms worsen or if the condition fails to improve as anticipated.  I provided 15 minutes of non-face-to-face time during this encounter.   Evangeline Dakin, DNP  [facility] [address] [place], [place] [address] [phone]

## 2018-10-31 ENCOUNTER — Other Ambulatory Visit (INDEPENDENT_AMBULATORY_CARE_PROVIDER_SITE_OTHER): Payer: Self-pay

## 2018-10-31 ENCOUNTER — Other Ambulatory Visit: Payer: Self-pay

## 2018-10-31 DIAGNOSIS — R569 Unspecified convulsions: Secondary | ICD-10-CM | POA: Diagnosis not present

## 2018-10-31 DIAGNOSIS — Z0289 Encounter for other administrative examinations: Secondary | ICD-10-CM

## 2018-11-02 LAB — CBC WITH DIFFERENTIAL/PLATELET
Basophils Absolute: 0 10*3/uL (ref 0.0–0.2)
Basos: 0 %
EOS (ABSOLUTE): 0.1 10*3/uL (ref 0.0–0.4)
Eos: 1 %
Hematocrit: 39.9 % (ref 34.0–46.6)
Hemoglobin: 14.4 g/dL (ref 11.1–15.9)
Immature Grans (Abs): 0 10*3/uL (ref 0.0–0.1)
Immature Granulocytes: 0 %
Lymphocytes Absolute: 3 10*3/uL (ref 0.7–3.1)
Lymphs: 31 %
MCH: 34.3 pg — ABNORMAL HIGH (ref 26.6–33.0)
MCHC: 36.1 g/dL — ABNORMAL HIGH (ref 31.5–35.7)
MCV: 95 fL (ref 79–97)
Monocytes Absolute: 0.9 10*3/uL (ref 0.1–0.9)
Monocytes: 10 %
Neutrophils Absolute: 5.5 10*3/uL (ref 1.4–7.0)
Neutrophils: 58 %
Platelets: 266 10*3/uL (ref 150–450)
RBC: 4.2 x10E6/uL (ref 3.77–5.28)
RDW: 11.7 % (ref 11.7–15.4)
WBC: 9.5 10*3/uL (ref 3.4–10.8)

## 2018-11-02 LAB — LAMOTRIGINE LEVEL: Lamotrigine Lvl: 3.6 ug/mL (ref 2.0–20.0)

## 2018-11-02 LAB — COMPREHENSIVE METABOLIC PANEL
ALT: 13 IU/L (ref 0–32)
AST: 19 IU/L (ref 0–40)
Albumin/Globulin Ratio: 2 (ref 1.2–2.2)
Albumin: 4.7 g/dL (ref 3.8–4.8)
Alkaline Phosphatase: 51 IU/L (ref 39–117)
BUN/Creatinine Ratio: 11 — ABNORMAL LOW (ref 12–28)
BUN: 8 mg/dL (ref 8–27)
Bilirubin Total: 0.4 mg/dL (ref 0.0–1.2)
CO2: 26 mmol/L (ref 20–29)
Calcium: 10.4 mg/dL — ABNORMAL HIGH (ref 8.7–10.3)
Chloride: 88 mmol/L — ABNORMAL LOW (ref 96–106)
Creatinine, Ser: 0.7 mg/dL (ref 0.57–1.00)
GFR calc Af Amer: 107 mL/min/{1.73_m2} (ref >59)
GFR calc non Af Amer: 93 mL/min/{1.73_m2} (ref >59)
Globulin, Total: 2.4 g/dL (ref 1.5–4.5)
Glucose: 75 mg/dL (ref 65–99)
Potassium: 5 mmol/L (ref 3.5–5.2)
Sodium: 129 mmol/L — ABNORMAL LOW (ref 134–144)
Total Protein: 7.1 g/dL (ref 6.0–8.5)

## 2018-11-03 DIAGNOSIS — H401131 Primary open-angle glaucoma, bilateral, mild stage: Secondary | ICD-10-CM | POA: Diagnosis not present

## 2018-11-03 DIAGNOSIS — H35363 Drusen (degenerative) of macula, bilateral: Secondary | ICD-10-CM | POA: Diagnosis not present

## 2018-11-06 ENCOUNTER — Telehealth: Payer: Self-pay | Admitting: Neurology

## 2018-11-06 MED ORDER — LAMOTRIGINE 100 MG PO TABS: 100.0000 mg | ORAL_TABLET | Freq: Two times a day (BID) | ORAL | 1 refills | Status: DC

## 2018-11-06 NOTE — Telephone Encounter (Signed)
I called the patient to discuss her lab work.  She has history of SIADH, her sodium level came back at 129. She has been feeling well, has not had recurrent seizures. Wanted to let her know and discuss with endocrinologist. She verbalized understanding.  Lamictal level in okay range.  I will send in a refill of Lamictal 100 mg twice daily.

## 2018-11-07 ENCOUNTER — Encounter: Payer: Self-pay | Admitting: Neurology

## 2018-12-12 ENCOUNTER — Other Ambulatory Visit: Payer: Self-pay | Admitting: Family Medicine

## 2019-02-26 ENCOUNTER — Encounter: Payer: Self-pay | Admitting: Family Medicine

## 2019-02-26 DIAGNOSIS — Z1231 Encounter for screening mammogram for malignant neoplasm of breast: Secondary | ICD-10-CM | POA: Diagnosis not present

## 2019-03-01 DIAGNOSIS — Z23 Encounter for immunization: Secondary | ICD-10-CM | POA: Diagnosis not present

## 2019-03-01 DIAGNOSIS — M810 Age-related osteoporosis without current pathological fracture: Secondary | ICD-10-CM | POA: Diagnosis not present

## 2019-03-01 DIAGNOSIS — E210 Primary hyperparathyroidism: Secondary | ICD-10-CM | POA: Diagnosis not present

## 2019-03-01 DIAGNOSIS — E222 Syndrome of inappropriate secretion of antidiuretic hormone: Secondary | ICD-10-CM | POA: Diagnosis not present

## 2019-03-20 DIAGNOSIS — M810 Age-related osteoporosis without current pathological fracture: Secondary | ICD-10-CM | POA: Diagnosis not present

## 2019-03-21 ENCOUNTER — Telehealth: Payer: Self-pay

## 2019-03-21 NOTE — Telephone Encounter (Signed)
LVM w COVID screen, back lab and front door info

## 2019-03-23 ENCOUNTER — Other Ambulatory Visit: Payer: Self-pay | Admitting: Internal Medicine

## 2019-03-23 DIAGNOSIS — M810 Age-related osteoporosis without current pathological fracture: Secondary | ICD-10-CM

## 2019-03-26 ENCOUNTER — Telehealth: Payer: Self-pay | Admitting: Family Medicine

## 2019-03-26 DIAGNOSIS — R7303 Prediabetes: Secondary | ICD-10-CM

## 2019-03-26 DIAGNOSIS — E785 Hyperlipidemia, unspecified: Secondary | ICD-10-CM

## 2019-03-26 NOTE — Telephone Encounter (Signed)
-----   Message from Ellamae Sia sent at 03/19/2019 12:37 PM EDT ----- Regarding: lab orders for Tuesday, 11.3.20 Patient is scheduled for CPX labs, please order future labs, Thanks , Karna Christmas

## 2019-03-27 ENCOUNTER — Other Ambulatory Visit: Payer: Self-pay

## 2019-03-27 ENCOUNTER — Other Ambulatory Visit (INDEPENDENT_AMBULATORY_CARE_PROVIDER_SITE_OTHER): Payer: BC Managed Care – PPO

## 2019-03-27 DIAGNOSIS — E785 Hyperlipidemia, unspecified: Secondary | ICD-10-CM | POA: Diagnosis not present

## 2019-03-27 DIAGNOSIS — R7303 Prediabetes: Secondary | ICD-10-CM

## 2019-03-27 LAB — LIPID PANEL
Cholesterol: 231 mg/dL — ABNORMAL HIGH (ref 0–200)
HDL: 123.3 mg/dL (ref >39.00)
LDL Cholesterol: 100 mg/dL — ABNORMAL HIGH (ref 0–99)
NonHDL: 107.95
Total CHOL/HDL Ratio: 2
Triglycerides: 41 mg/dL (ref 0.0–149.0)
VLDL: 8.2 mg/dL (ref 0.0–40.0)

## 2019-03-27 LAB — COMPREHENSIVE METABOLIC PANEL
ALT: 12 U/L (ref 0–35)
AST: 16 U/L (ref 0–37)
Albumin: 4.6 g/dL (ref 3.5–5.2)
Alkaline Phosphatase: 50 U/L (ref 39–117)
BUN: 14 mg/dL (ref 6–23)
CO2: 31 mEq/L (ref 19–32)
Calcium: 10.8 mg/dL — ABNORMAL HIGH (ref 8.4–10.5)
Chloride: 93 mEq/L — ABNORMAL LOW (ref 96–112)
Creatinine, Ser: 0.71 mg/dL (ref 0.40–1.20)
GFR: 83.01 mL/min (ref >60.00)
Glucose, Bld: 86 mg/dL (ref 70–99)
Potassium: 4.4 mEq/L (ref 3.5–5.1)
Sodium: 132 mEq/L — ABNORMAL LOW (ref 135–145)
Total Bilirubin: 0.3 mg/dL (ref 0.2–1.2)
Total Protein: 7.5 g/dL (ref 6.0–8.3)

## 2019-03-28 LAB — HEMOGLOBIN A1C: Hgb A1c MFr Bld: 5.7 % (ref 4.6–6.5)

## 2019-03-28 NOTE — Progress Notes (Signed)
No critical labs need to be addressed urgently. We will discuss labs in detail at upcoming office visit.   

## 2019-04-03 ENCOUNTER — Other Ambulatory Visit: Payer: Self-pay

## 2019-04-03 ENCOUNTER — Other Ambulatory Visit: Payer: Self-pay | Admitting: Family Medicine

## 2019-04-03 ENCOUNTER — Ambulatory Visit (INDEPENDENT_AMBULATORY_CARE_PROVIDER_SITE_OTHER): Payer: BC Managed Care – PPO | Admitting: Family Medicine

## 2019-04-03 ENCOUNTER — Encounter: Payer: Self-pay | Admitting: Family Medicine

## 2019-04-03 VITALS — BP 138/86 | HR 74 | Temp 98.2°F | Ht 68.0 in | Wt 121.5 lb

## 2019-04-03 DIAGNOSIS — I10 Essential (primary) hypertension: Secondary | ICD-10-CM | POA: Diagnosis not present

## 2019-04-03 DIAGNOSIS — Z23 Encounter for immunization: Secondary | ICD-10-CM | POA: Diagnosis not present

## 2019-04-03 DIAGNOSIS — Z0000 Encounter for general adult medical examination without abnormal findings: Secondary | ICD-10-CM

## 2019-04-03 DIAGNOSIS — E782 Mixed hyperlipidemia: Secondary | ICD-10-CM | POA: Diagnosis not present

## 2019-04-03 DIAGNOSIS — R350 Frequency of micturition: Secondary | ICD-10-CM

## 2019-04-03 DIAGNOSIS — R569 Unspecified convulsions: Secondary | ICD-10-CM

## 2019-04-03 DIAGNOSIS — E213 Hyperparathyroidism, unspecified: Secondary | ICD-10-CM

## 2019-04-03 DIAGNOSIS — E222 Syndrome of inappropriate secretion of antidiuretic hormone: Secondary | ICD-10-CM

## 2019-04-03 DIAGNOSIS — R7303 Prediabetes: Secondary | ICD-10-CM

## 2019-04-03 LAB — POC URINALSYSI DIPSTICK (AUTOMATED)
Bilirubin, UA: NEGATIVE
Glucose, UA: NEGATIVE
Ketones, UA: NEGATIVE
Leukocytes, UA: NEGATIVE
Nitrite, UA: NEGATIVE
Protein, UA: NEGATIVE
Spec Grav, UA: 1.015 (ref 1.010–1.025)
Urobilinogen, UA: 0.2 E.U./dL
pH, UA: 7 (ref 5.0–8.0)

## 2019-04-03 MED ORDER — METOPROLOL TARTRATE 25 MG PO TABS: 12.5000 mg | ORAL_TABLET | Freq: Two times a day (BID) | ORAL | 3 refills | Status: DC

## 2019-04-03 MED ORDER — AMLODIPINE BESYLATE 2.5 MG PO TABS: 2.5000 mg | ORAL_TABLET | Freq: Two times a day (BID) | ORAL | 3 refills | Status: DC

## 2019-04-03 NOTE — Patient Instructions (Signed)
Preventive Care 63-64 Years Old, Female Preventive care refers to visits with your health care provider and lifestyle choices that can promote health and wellness. This includes:  A yearly physical exam. This may also be called an annual well check.  Regular dental visits and eye exams.  Immunizations.  Screening for certain conditions.  Healthy lifestyle choices, such as eating a healthy diet, getting regular exercise, not using drugs or products that contain nicotine and tobacco, and limiting alcohol use. What can I expect for my preventive care visit? Physical exam Your health care provider will check your:  Height and weight. This may be used to calculate body mass index (BMI), which tells if you are at a healthy weight.  Heart rate and blood pressure.  Skin for abnormal spots. Counseling Your health care provider may ask you questions about your:  Alcohol, tobacco, and drug use.  Emotional well-being.  Home and relationship well-being.  Sexual activity.  Eating habits.  Work and work environment.  Method of birth control.  Menstrual cycle.  Pregnancy history. What immunizations do I need?  Influenza (flu) vaccine  This is recommended every year. Tetanus, diphtheria, and pertussis (Tdap) vaccine  You may need a Td booster every 10 years. Varicella (chickenpox) vaccine  You may need this if you have not been vaccinated. Zoster (shingles) vaccine  You may need this after age 63. Measles, mumps, and rubella (MMR) vaccine  You may need at least one dose of MMR if you were born in 1957 or later. You may also need a second dose. Pneumococcal conjugate (PCV13) vaccine  You may need this if you have certain conditions and were not previously vaccinated. Pneumococcal polysaccharide (PPSV23) vaccine  You may need one or two doses if you smoke cigarettes or if you have certain conditions. Meningococcal conjugate (MenACWY) vaccine  You may need this if you  have certain conditions. Hepatitis A vaccine  You may need this if you have certain conditions or if you travel or work in places where you may be exposed to hepatitis A. Hepatitis B vaccine  You may need this if you have certain conditions or if you travel or work in places where you may be exposed to hepatitis B. Haemophilus influenzae type b (Hib) vaccine  You may need this if you have certain conditions. Human papillomavirus (HPV) vaccine  If recommended by your health care provider, you may need three doses over 6 months. You may receive vaccines as individual doses or as more than one vaccine together in one shot (combination vaccines). Talk with your health care provider about the risks and benefits of combination vaccines. What tests do I need? Blood tests  Lipid and cholesterol levels. These may be checked every 5 years, or more frequently if you are over 63 years old.  Hepatitis C test.  Hepatitis B test. Screening  Lung cancer screening. You may have this screening every year starting at age 63 if you have a 30-pack-year history of smoking and currently smoke or have quit within the past 15 years.  Colorectal cancer screening. All adults should have this screening starting at age 63 and continuing until age 75. Your health care provider may recommend screening at age 63 if you are at increased risk. You will have tests every 1-10 years, depending on your results and the type of screening test.  Diabetes screening. This is done by checking your blood sugar (glucose) after you have not eaten for a while (fasting). You may have this   done every 1-3 years.  Mammogram. This may be done every 1-2 years. Talk with your health care provider about when you should start having regular mammograms. This may depend on whether you have a family history of breast cancer.  BRCA-related cancer screening. This may be done if you have a family history of breast, ovarian, tubal, or peritoneal  cancers.  Pelvic exam and Pap test. This may be done every 3 years starting at age 63. Starting at age 63, this may be done every 5 years if you have a Pap test in combination with an HPV test. Other tests  Sexually transmitted disease (STD) testing.  Bone density scan. This is done to screen for osteoporosis. You may have this scan if you are at high risk for osteoporosis. Follow these instructions at home: Eating and drinking  Eat a diet that includes fresh fruits and vegetables, whole grains, lean protein, and low-fat dairy.  Take vitamin and mineral supplements as recommended by your health care provider.  Do not drink alcohol if: ? Your health care provider tells you not to drink. ? You are pregnant, may be pregnant, or are planning to become pregnant.  If you drink alcohol: ? Limit how much you have to 0-1 drink a day. ? Be aware of how much alcohol is in your drink. In the [place] one drink equals one 12 oz bottle of beer (355 mL), one 5 oz glass of wine (148 mL), or one 1 oz glass of hard liquor (44 mL). Lifestyle  Take daily care of your teeth and gums.  Stay active. Exercise for at least 30 minutes on 5 or more days each week.  Do not use any products that contain nicotine or tobacco, such as cigarettes, e-cigarettes, and chewing tobacco. If you need help quitting, ask your health care provider.  If you are sexually active, practice safe sex. Use a condom or other form of birth control (contraception) in order to prevent pregnancy and STIs (sexually transmitted infections).  If told by your health care provider, take low-dose aspirin daily starting at age 63. What's next?  Visit your health care provider once a year for a well check visit.  Ask your health care provider how often you should have your eyes and teeth checked.  Stay up to date on all vaccines. This information is not intended to replace advice given to you by your health care provider. Make sure you  discuss any questions you have with your health care provider. Document Released: 06/06/2015 Document Revised: 01/19/2018 Document Reviewed: 01/19/2018 Elsevier Patient Education  2020 [employer]  

## 2019-04-03 NOTE — Assessment & Plan Note (Signed)
Well controlled. Continue current medication.  

## 2019-04-03 NOTE — Assessment & Plan Note (Signed)
No recent seizures on lamictal

## 2019-04-03 NOTE — Assessment & Plan Note (Signed)
At goal on no med. 

## 2019-04-03 NOTE — Addendum Note (Signed)
Addended by: Carter Kitten on: 04/03/2019 11:12 AM   Modules accepted: Orders

## 2019-04-03 NOTE — Addendum Note (Signed)
Addended by: Carter Kitten on: 04/03/2019 11:06 AM   Modules accepted: Orders

## 2019-04-03 NOTE — Assessment & Plan Note (Signed)
Followed by ENDO 

## 2019-04-03 NOTE — Progress Notes (Signed)
Chief Complaint  Patient presents with  . Annual Exam    History of Present Illness: HPI  The patient is here for annual wellness exam and preventative care.    Hypertension:   .Good control on amlodipine, metoprolol BP Readings from Last 3 Encounters:  04/03/19 138/86  04/11/18 (!) 142/80  04/07/18 (!) 157/96  Using medication without problems or lightheadedness: none Chest pain with exertion:none Edema:none Short of breath:none Average home BPs: Other issues:  Elevated Cholesterol: Lab Results  Component Value Date   CHOL 231 (H) 03/27/2019   HDL 123.30 03/27/2019   LDLCALC 100 (H) 03/27/2019   LDLDIRECT 74.6 12/31/2011   TRIG 41.0 03/27/2019   CHOLHDL 2 03/27/2019  Using medications without problems: Muscle aches:  Diet compliance: trying  To eat protein in diet, trying to increase calories. 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    Primary hyperparath Calcium slightly high.. taking supplement.. followed by Dr. Buddy Duty.  Seizures:  No seizures . Stable on lamictal for 3 years. Neuro  Dr. Krista Blue.    Office Visit from 04/03/2019 in [place] at [facility] Total Score  0      COVID 19 screen No recent travel or known exposure to COVID19 The patient denies respiratory symptoms of COVID 19 at this time.  The importance of social distancing was discussed today.   Review of Systems  Constitutional: Negative for chills and fever.  HENT: Negative for congestion and ear pain.   Eyes: Negative for pain and redness.  Respiratory: Negative for cough and shortness of breath.   Cardiovascular: Negative for chest pain, palpitations and leg swelling.  Gastrointestinal: Negative for abdominal pain, blood in stool, constipation, diarrhea, nausea and vomiting.  Genitourinary: Positive for frequency. Negative for dysuria, flank pain, hematuria and urgency.  Musculoskeletal: Negative for falls and myalgias.   Skin: Negative for rash.  Neurological: Negative for dizziness.  Psychiatric/Behavioral: Negative for depression. The patient is not nervous/anxious.       Past Medical History:  Diagnosis Date  . Anxiety   . Dermatitis, seborrheic 2018  . Deviated nasal septum   . Diverticulitis   . Diverticulosis   . HTN (hypertension)   . Hyperlipemia   . Hyperplastic colon polyp   . Hyponatremia   . Hyponatremia   . MVP (mitral valve prolapse)   . Raynaud phenomenon   . Seizures ([place])   . SIADH (syndrome of inappropriate ADH production) ([employer])     reports that she quit smoking about 20 years ago. Her smoking use included cigarettes. She has never used smokeless tobacco. She reports current alcohol use of about 14.0 standard drinks of alcohol per week. She reports current drug use. Drug: Marijuana.   Current Outpatient Medications:  .  amLODipine (NORVASC) 2.5 MG tablet, TAKE 1 TABLET BY MOUTH TWICE DAILY, Disp: 180 tablet, Rfl: 3 .  Biotin 1 MG CAPS, Take 1 tablet by mouth daily., Disp: , Rfl:  .  Calcium-Vitamin D (CALTRATE 600 PLUS-VIT D PO), Take 1 tablet by mouth daily., Disp: , Rfl:  .  Cholecalciferol (VITAMIN D-3 PO), Take 1 capsule by mouth daily., Disp: , Rfl:  .  hydrocortisone 2.5 % cream, Apply 1 application 2 (two) times daily topically., Disp: , Rfl:  .  lamoTRIgine (LAMICTAL) 100 MG tablet, Take 1 tablet (100 mg total) by mouth 2 (two) times daily., Disp: 180 tablet, Rfl: 1 .  latanoprost (XALATAN) 0.005 % ophthalmic solution, Place 1 drop into both eyes  at bedtime., Disp: , Rfl:  .  metoprolol tartrate (LOPRESSOR) 25 MG tablet, TAKE 1/2 TABLET BY MOUTH TWICE DAILY, Disp: 90 tablet, Rfl: 1 .  triamcinolone cream (KENALOG) 0.5 %, Apply 1 application topically 2 (two) times daily as needed., Disp: 30 g, Rfl: 0 .  vitamin C (ASCORBIC ACID) 500 MG tablet, Take 500 mg by mouth daily., Disp: , Rfl:  .  zoledronic acid (RECLAST) 5 MG/100ML SOLN injection, Inject 5 mg into the vein  once., Disp: , Rfl:    Observations/Objective: Blood pressure 138/86, pulse 74, temperature 98.2 F (36.8 C), temperature source Temporal, height 5\' 8"  (1.727 m), weight 121 lb 8 oz (55.1 kg), SpO2 98 %.  Physical Exam Constitutional:      General: She is not in acute distress.    Appearance: Normal appearance. She is well-developed. She is not ill-appearing or toxic-appearing.     Comments: Tall, thin appearing  HENT:     Head: Normocephalic.     Right Ear: Hearing, tympanic membrane, ear canal and external ear normal.     Left Ear: Hearing, tympanic membrane, ear canal and external ear normal.     Nose: Nose normal.  Eyes:     General: Lids are normal. Lids are everted, no foreign bodies appreciated.     Conjunctiva/sclera: Conjunctivae normal.     Pupils: Pupils are equal, round, and reactive to light.  Neck:     Musculoskeletal: Normal range of motion and neck supple.     Thyroid: No thyroid mass or thyromegaly.     Vascular: No carotid bruit.     Trachea: Trachea normal.  Cardiovascular:     Rate and Rhythm: Normal rate and regular rhythm.     Heart sounds: Normal heart sounds, S1 normal and S2 normal. No murmur. No gallop.   Pulmonary:     Effort: Pulmonary effort is normal. No respiratory distress.     Breath sounds: Normal breath sounds. No wheezing, rhonchi or rales.  Abdominal:     General: Bowel sounds are normal. There is no distension or abdominal bruit.     Palpations: Abdomen is soft. There is no fluid wave or mass.     Tenderness: There is no abdominal tenderness. There is no guarding or rebound.     Hernia: No hernia is present.  Lymphadenopathy:     Cervical: No cervical adenopathy.  Skin:    General: Skin is warm and dry.     Findings: No rash.  Neurological:     Mental Status: She is alert.     Cranial Nerves: No cranial nerve deficit.     Sensory: No sensory deficit.  Psychiatric:        Mood and Affect: Mood is not anxious or depressed.         Speech: Speech normal.        Behavior: Behavior normal. Behavior is cooperative.        Judgment: Judgment normal.      Assessment and Plan    The patient's preventative maintenance and recommended screening tests for an annual wellness exam were reviewed in full today. Brought up to date unless services declined.  Counselled on the importance of diet, exercise, and its role in overall health and mortality. The patient's FH and SH was reviewed, including their home life, tobacco status, and drug and alcohol status.   Last DEXA 2015 worsening osteopenia, stopped boniva. 2017 osteoporosisENDO treating.. Started reclast Reclast 2019.. schedule DXA 05/2019 Last colon: 2011 was  bengin polyps.. No repeat screen in this way given small caliber colon. Likely in 10 years ifob.  UTD with vaccinesflu, shingles ( will get Tdap today) Mammo nml 10/2020nml Pap q 5years, last nml in 2016, neg HPV,DVE not indicated asymptomatic and no family history. Counseled against marijuana use.  Nonsmoker Hep C: neg   HYPERTENSION, BENIGN ESSENTIAL, LABILE Well controlled. Continue current medication.   Hyperlipidemia At goal on no med.  Seizures ([place]) No recent seizures on lamictal  Prediabetes Lab Results  Component Value Date   HGBA1C 5.7 03/27/2019    Good control with diet.  Hyperparathyroidism ([place])  Followed by ENDO.    Eliezer Lofts, MD

## 2019-04-03 NOTE — Assessment & Plan Note (Signed)
Lab Results  Component Value Date   HGBA1C 5.7 03/27/2019    Good control with diet.

## 2019-04-24 DIAGNOSIS — H401132 Primary open-angle glaucoma, bilateral, moderate stage: Secondary | ICD-10-CM | POA: Diagnosis not present

## 2019-04-30 ENCOUNTER — Telehealth: Payer: Self-pay | Admitting: Neurology

## 2019-04-30 ENCOUNTER — Ambulatory Visit: Payer: BC Managed Care – PPO | Admitting: Neurology

## 2019-04-30 ENCOUNTER — Other Ambulatory Visit: Payer: Self-pay

## 2019-04-30 ENCOUNTER — Encounter: Payer: Self-pay | Admitting: Neurology

## 2019-04-30 VITALS — BP 180/91 | HR 73 | Temp 97.6°F | Ht 68.0 in | Wt 121.4 lb

## 2019-04-30 DIAGNOSIS — R569 Unspecified convulsions: Secondary | ICD-10-CM

## 2019-04-30 MED ORDER — LAMOTRIGINE 100 MG PO TABS: 100.0000 mg | ORAL_TABLET | Freq: Two times a day (BID) | ORAL | 1 refills | Status: DC

## 2019-04-30 NOTE — Telephone Encounter (Signed)
Please discuss with patient, BP elevated today 180/91. Has been doing well at prior appointments. Check to see if she took her medications, have her check her BP at home. Discuss with PCP if continues to be elevated.

## 2019-04-30 NOTE — Telephone Encounter (Signed)
Pt returned call and stated that she did take her meds today , and she checks on a regular basis and that anytime she comes in to a doctors office setting it rises.

## 2019-04-30 NOTE — Telephone Encounter (Signed)
I called pt and she was doing ok.  As per note stated she took her med, her Bp at home 122/88 HR 87.  She will monitor and if elevated consistently will contact pcp.  Saw her in Nov 2020.

## 2019-04-30 NOTE — Progress Notes (Signed)
PATIENT: Candice Gray DOB: [birth year]  REASON FOR VISIT: follow up HISTORY FROM: patient  HISTORY OF PRESENT ILLNESS: Today 04/30/19  HISTORY  HISTORY OF PRESENT ILLNESS: Candice Gray a 63 years old right-handed female, seen in refer by her primary care doctor Jinny Sanders for evaluation of seizure on March 25 2016, initial evaluation was on April 01 2016.  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  She was put on water restriction 64 ounces daily ever since, is under close supervision of endocrinologist, she reported frequent thirsty recently, hasliberatedher water intake some,  On March 25 2016 around 10:00 in the morning, she noticed mild dizziness, sat down in the chair, lost consciousness, woke up on ambulance confused, she has a witnessed tonic-clonic seizure activity, her body becoming rigid, mouth open, eyes rolled back, lasting for a few minutes, followed by post event confusion.   I personally reviewed MRI of the brain on March 25 2016, mild generalized atrophy no acute abnormality   I reviewed her laboratory evaluation in November, glucose was 123, sodium was 129, potassium was 3.2, chloride was 90, glucose was 118, creatinine 0.78, WBC was mildly elevated 13, hemoglobin was 15 point 1, magnesium 1.6, A1c was 5.5, normal TSH, BNP was 112, magnesium 1.8, UDS was positive for marijuana, alcohol was less than 5, negative hepatitis C,  EEG showed: posterior dominant rhythm of 9 Hz reactive eye opening and closure., there are epileptiform discharges per description with maximum at F7 and T3. These do not evolve into electrographic seizures. No sleep was recorded.   She was treated with Keppra 500 mg 3 times a day, she complains of being anxious about her health, get irritated easily, she  does smoke marijuana regularly, now she has stopped smoking.  UPDATE Sep 29 2016:YY She is doing well, able to tolerate lamotrigine 100 mg twice a day much better, no recurrent seizure,  UPDATE 5/10/2019CMMs. Candice Gray, 63 year old female returns for follow-up with history of seizure disorder. She had one episode of amnesia-like event recently possibly a seizure also an episode followed by fogginess and high heart rate but she had not eaten. She is currently on Lamictal 100 mg twice daily. She has a history of SIADH and she says when her sodium level gets low she can also have confusion. It has not been checked since last year by PCP. She returns for reevaluation UPDATE11/19/2019CMMs. Candice Gray, 63 year old female returns for follow-up with history of seizure disorder. She has had several amnesic-like events in the last 6 months followed by some mental fogginess. She also reports that she has been under extreme stress. Her son took his life several months ago. She claims she has a strong support group but bereavement therapy was also encouraged for her and her husband. She remains on Lamictal 100 mg twice daily. Her last level was 5.8. She has a history of SIADH,her most recent sodium level by primary care was 130. She returns for reevaluation  Update October 25, 2018 SS: Candice Gray 63 year old female with history of seizure disorder.  Past EEG showed focal abnormality of the anterior temporal lobe, is currently taking Lamictal 100 mg twice daily.  Almost 1 year ago her son committed suicide, is doing better emotionally.  She has only had 1 seizure, November 2017.  She does have history of SIADH, she  sees endocrinology.  She is retired, lives with her husband, is outdoors often.  Denies new problems or concerns.  She indicates she is tolerating Lamictal well.  She reports occasionally she will have some lightheadedness when she stands.  Update April 30, 2019 SS: Candice Gray is a 63 year old female  with history of seizure disorder.  Laboratory evaluation in June 2020 showed therapeutic Lamictal at 3.6, sodium level 129, chloride 88 with history of SIADH.  Recent sodium level was 132.  She continues to do well, does report memory loss, she is upset she can't find a box of special ornaments, memory is gradual overtime.  She manages well in the home, does all her own ADLs, lives with her husband.  May have occasional word finding difficulty.  She remains on Lamictal 100 mg twice a day.   REVIEW OF SYSTEMS: Out of a complete 14 system review of symptoms, the patient complains only of the following symptoms, and all other reviewed systems are negative.  Memory loss, seizures  ALLERGIES: Allergies  Allergen Reactions  . Azelastine Hcl     REACTION: increased bp  . Depo-Medrol [Methylprednisolone] Itching  . Miralax [Polyethylene Glycol] Itching  . Red Dye Other (See Comments)    Contact Dermatitis  . Zyban [Bupropion] Swelling    HOME MEDICATIONS: Outpatient Medications Prior to Visit  Medication Sig Dispense Refill  . amLODipine (NORVASC) 2.5 MG tablet Take 1 tablet (2.5 mg total) by mouth 2 (two) times daily. 180 tablet 3  . Biotin 1 MG CAPS Take 1 tablet by mouth daily.    . Calcium-Vitamin D (CALTRATE 600 PLUS-VIT D PO) Take 1 tablet by mouth daily.    . Cholecalciferol (VITAMIN D-3 PO) Take 1 capsule by mouth daily.    . hydrocortisone 2.5 % cream Apply 1 application 2 (two) times daily topically.    . lamoTRIgine (LAMICTAL) 100 MG tablet Take 1 tablet (100 mg total) by mouth 2 (two) times daily. 180 tablet 1  . latanoprost (XALATAN) 0.005 % ophthalmic solution Place 1 drop into both eyes at bedtime.    . metoprolol tartrate (LOPRESSOR) 25 MG tablet Take 0.5 tablets (12.5 mg total) by mouth 2 (two) times daily. 90 tablet 3  . triamcinolone cream (KENALOG) 0.5 % Apply 1 application topically 2 (two) times daily as needed. 30 g 0  . vitamin C (ASCORBIC ACID) 500 MG tablet Take 500 mg  by mouth daily.    . zoledronic acid (RECLAST) 5 MG/100ML SOLN injection Inject 5 mg into the vein once.     No facility-administered medications prior to visit.     PAST MEDICAL HISTORY: Past Medical History:  Diagnosis Date  . Anxiety   . Dermatitis, seborrheic 2018  . Deviated nasal septum   . Diverticulitis   . Diverticulosis   . HTN (hypertension)   . Hyperlipemia   . Hyperplastic colon polyp   . Hyponatremia   . Hyponatremia   . MVP (mitral valve prolapse)   . Raynaud phenomenon   . Seizures ([place])   . SIADH (syndrome of inappropriate ADH production) ([place])     PAST SURGICAL HISTORY: Past Surgical History:  Procedure Laterality Date  . ABDOMINAL HYSTERECTOMY    . CATARACT EXTRACTION, BILATERAL  2019  . CRYOTHERAPY     for cervical dysplasia  . NM MYOCAR PERF WALL MOTION  11/2007   bruce myoview; perfusion defect in anterior myocardium (breast attenuation); post-stress EF 77%; normal, low risk study   . TRANSTHORACIC ECHOCARDIOGRAM  11/2007  EF=>55%; mild MR; trace TR  . TUBAL LIGATION      FAMILY HISTORY: Family History  Problem Relation Age of Onset  . Hyperlipidemia Father   . Hypertension Father   . Diabetes Father   . Atrial fibrillation Father   . Diabetes Paternal Grandmother   . Cancer Paternal Grandfather        brain tumor  . Hyperlipidemia Other   . Hypertension Other   . Coronary artery disease Maternal Grandmother   . Diabetes Maternal Grandmother   . Hyperlipidemia Brother   . Transient ischemic attack Mother   . Colon cancer Neg Hx     SOCIAL HISTORY: Social History   Socioeconomic History  . Marital status: Married    Spouse name: Legrand Como  . Number of children: 2  . Years of education: College  . Highest education level: Not on file  Occupational History  . Occupation: sub Dealer: UNEMPLOYED  Social Needs  . Financial resource strain: Not on file  . Food insecurity    Worry: Not on  file    Inability: Not on file  . Transportation needs    Medical: Not on file    Non-medical: Not on file  Tobacco Use  . Smoking status: Former Smoker    Types: Cigarettes    Quit date: 06/05/1998    Years since quitting: 20.9  . Smokeless tobacco: Never Used  Substance and Sexual Activity  . Alcohol use: Yes    Alcohol/week: 14.0 standard drinks    Types: 14 Cans of beer per week    Comment: daily  . Drug use: Yes    Types: Marijuana    Comment: 04/11/18 yes marijuana  . Sexual activity: Not on file  Lifestyle  . Physical activity    Days per week: Not on file    Minutes per session: Not on file  . Stress: Not on file  Relationships  . Social Herbalist on phone: Not on file    Gets together: Not on file    Attends religious service: Not on file    Active member of club or organization: Not on file    Attends meetings of clubs or organizations: Not on file    Relationship status: Not on file  . Intimate partner violence    Fear of current or ex partner: Not on file    Emotionally abused: Not on file    Physically abused: Not on file    Forced sexual activity: Not on file  Other Topics Concern  . Not on file  Social History Narrative   Lives at husband and 2 sons   Right-handed   Regular exercise--yes   Caffeine: daily coffee   Diet: fruits and veggies   PHYSICAL EXAM  Vitals:   04/30/19 0933  BP: (!) 180/91  Pulse: 73  Temp: 97.6 F (36.4 C)  TempSrc: Oral  Weight: 121 lb 6.4 oz (55.1 kg)  Height: 5\' 8"  (1.727 m)   Body mass index is 18.46 kg/m.  Generalized: Well developed, in no acute distress   Neurological examination  Mentation: Alert oriented to time, place, history taking. Follows all commands speech and language fluent Cranial nerve II-XII: Pupils were equal round reactive to light. Extraocular movements were full, visual field were full on confrontational test. Facial sensation and strength were normal. Head turning and shoulder  shrug  were normal and symmetric. Motor: The motor testing reveals 5 over  5 strength of all 4 extremities. Good symmetric motor tone is noted throughout.  Sensory: Sensory testing is intact to soft touch on all 4 extremities. No evidence of extinction is noted.  Coordination: Cerebellar testing reveals good finger-nose-finger and heel-to-shin bilaterally.  Gait and station: Gait is normal. Tandem gait is normal. Romberg is negative. No drift is seen.  Reflexes: Deep tendon reflexes are symmetric and normal bilaterally.   DIAGNOSTIC DATA (LABS, IMAGING, TESTING) - I reviewed patient records, labs, notes, testing and imaging myself where available.  Lab Results  Component Value Date   WBC 9.5 10/31/2018   HGB 14.4 10/31/2018   HCT 39.9 10/31/2018   MCV 95 10/31/2018   PLT 266 10/31/2018      Component Value Date/Time   NA 132 (L) 03/27/2019 0815   NA 129 (L) 10/31/2018 1040   K 4.4 03/27/2019 0815   CL 93 (L) 03/27/2019 0815   CO2 31 03/27/2019 0815   GLUCOSE 86 03/27/2019 0815   BUN 14 03/27/2019 0815   BUN 8 10/31/2018 1040   CREATININE 0.71 03/27/2019 0815   CALCIUM 10.8 (H) 03/27/2019 0815   PROT 7.5 03/27/2019 0815   PROT 7.1 10/31/2018 1040   ALBUMIN 4.6 03/27/2019 0815   ALBUMIN 4.7 10/31/2018 1040   AST 16 03/27/2019 0815   ALT 12 03/27/2019 0815   ALKPHOS 50 03/27/2019 0815   BILITOT 0.3 03/27/2019 0815   BILITOT 0.4 10/31/2018 1040   GFRNONAA 93 10/31/2018 1040   GFRAA 107 10/31/2018 1040   Lab Results  Component Value Date   CHOL 231 (H) 03/27/2019   HDL 123.30 03/27/2019   LDLCALC 100 (H) 03/27/2019   LDLDIRECT 74.6 12/31/2011   TRIG 41.0 03/27/2019   CHOLHDL 2 03/27/2019   Lab Results  Component Value Date   HGBA1C 5.7 03/27/2019   Lab Results  Component Value Date   VITAMINB12 390 12/20/2012   Lab Results  Component Value Date   TSH 1.824 03/25/2016    ASSESSMENT AND PLAN 63 y.o. year old female  has a past medical history of Anxiety,  Dermatitis, seborrheic (2018), Deviated nasal septum, Diverticulitis, Diverticulosis, HTN (hypertension), Hyperlipemia, Hyperplastic colon polyp, Hyponatremia, Hyponatremia, MVP (mitral valve prolapse), Raynaud phenomenon, Seizures ([place]), and SIADH (syndrome of inappropriate ADH production) ([place]). here with:  1.  Seizure 2.  Memory loss -Has continued to do well, no recurrent seizure -Continue Lamictal 100 mg twice a day -EEG showed focal abnormality of anterior temporal lobe -MRI of the brain showed generalized atrophy -Laboratory evaluation in June 2020 showed therapeutic Lamictal level 3.9, recent NA was 132, history of SIADH, followed by endocrinology -Follow-up in 6 months or sooner if needed, will follow memory issues overtime  I spent 15 minutes with the patient. 50% of this time was spent discussing her plan of care.  Butler Denmark, AGNP-C, DNP 04/30/2019, 9:40 AM [facility] [address] [place], [place] [address] [phone]

## 2019-04-30 NOTE — Patient Instructions (Signed)
Continue current medications  Call for any problems   Return in 6 months

## 2019-05-09 NOTE — Progress Notes (Signed)
I have reviewed and agreed above plan. 

## 2019-06-15 ENCOUNTER — Other Ambulatory Visit: Payer: Self-pay

## 2019-06-15 ENCOUNTER — Ambulatory Visit
Admission: RE | Admit: 2019-06-15 | Discharge: 2019-06-15 | Disposition: A | Discharge status: Home / Self Care | Payer: BC Managed Care – PPO | Source: Ambulatory Visit | Attending: Internal Medicine | Admitting: Internal Medicine

## 2019-06-15 DIAGNOSIS — Z780 Asymptomatic menopausal state: Secondary | ICD-10-CM | POA: Diagnosis not present

## 2019-06-15 DIAGNOSIS — M810 Age-related osteoporosis without current pathological fracture: Secondary | ICD-10-CM | POA: Diagnosis not present

## 2019-08-21 ENCOUNTER — Encounter: Payer: Self-pay | Admitting: Gastroenterology

## 2019-10-02 ENCOUNTER — Encounter: Payer: Self-pay | Admitting: Gastroenterology

## 2019-10-02 ENCOUNTER — Ambulatory Visit: Payer: BC Managed Care – PPO | Admitting: Gastroenterology

## 2019-10-02 VITALS — BP 130/90 | HR 72 | Temp 97.2°F | Ht 68.0 in | Wt 123.0 lb

## 2019-10-02 DIAGNOSIS — Z8719 Personal history of other diseases of the digestive system: Secondary | ICD-10-CM | POA: Diagnosis not present

## 2019-10-02 DIAGNOSIS — E222 Syndrome of inappropriate secretion of antidiuretic hormone: Secondary | ICD-10-CM | POA: Diagnosis not present

## 2019-10-02 DIAGNOSIS — Z1211 Encounter for screening for malignant neoplasm of colon: Secondary | ICD-10-CM

## 2019-10-02 DIAGNOSIS — K5730 Diverticulosis of large intestine without perforation or abscess without bleeding: Secondary | ICD-10-CM | POA: Diagnosis not present

## 2019-10-02 DIAGNOSIS — Z87898 Personal history of other specified conditions: Secondary | ICD-10-CM

## 2019-10-02 NOTE — Progress Notes (Signed)
Candice Gray    PJ:[id number]    Mar 08, 1956  Primary Care Physician:Bedsole, Mervyn Gay, MD  Referring Physician: Jinny Sanders, MD [place],  [place] [id number]   Chief complaint: Colorectal cancer screening  HPI:  64 year old female here for new patient visit to discuss colorectal cancer screening  Colonoscopy August 15, 2009 by Dr. Olevia Perches: Severe diverticulosis with partial obstruction in sigmoid colon, large hypertrophied folds, was switched to pediatric scope.  3 sigmoid polyps [normal polypoid mucosa with no adenoma] removed otherwise normal exam.  She says she had very bad experience during her last colonoscopy in 2011 by Dr. Olevia Perches and she is reluctant to undergo repeat colonoscopy.  Denies any nausea, vomiting, abdominal pain, change in bowel habits, decreased appetite, weight loss, melena or bright red blood per rectum.  No family history of colon cancer   Other relevant history includes  History of seizures initial episode in 2017.  MRI brain November 2017 showed mild generalized atrophy with no acute abnormality.  She was treated with Keppra initially and is currently on lamotrigine.  She has not had any recurrent seizures She smokes marijuana to help with anxiety  SIADH, is under water restriction to 64 ounces per day Baseline serum sodium ranges between 129 to low 130s  History of hemoperitoneum and liver laceration s/p fall in March 2015   Outpatient Encounter Medications as of 10/02/2019  Medication Sig  . amLODipine (NORVASC) 2.5 MG tablet Take 1 tablet (2.5 mg total) by mouth 2 (two) times daily.  . Biotin 1 MG CAPS Take 1 tablet by mouth daily.  . Calcium-Vitamin D (CALTRATE 600 PLUS-VIT D PO) Take 1 tablet by mouth daily.  . Cholecalciferol (VITAMIN D-3 PO) Take 1 capsule by mouth daily.  . hydrocortisone 2.5 % cream Apply 1 application 2 (two) times daily topically.  . lamoTRIgine (LAMICTAL) 100 MG tablet Take 1 tablet  (100 mg total) by mouth 2 (two) times daily.  Marland Kitchen latanoprost (XALATAN) 0.005 % ophthalmic solution Place 1 drop into both eyes at bedtime.  . metoprolol tartrate (LOPRESSOR) 25 MG tablet Take 0.5 tablets (12.5 mg total) by mouth 2 (two) times daily.  . vitamin C (ASCORBIC ACID) 500 MG tablet Take 500 mg by mouth daily.  . zoledronic acid (RECLAST) 5 MG/100ML SOLN injection Inject 5 mg into the vein once.  . [DISCONTINUED] triamcinolone cream (KENALOG) 0.5 % Apply 1 application topically 2 (two) times daily as needed.   No facility-administered encounter medications on file as of 10/02/2019.    Allergies as of 10/02/2019 - Review Complete 10/02/2019  Allergen Reaction Noted  . Azelastine hcl    . Depo-medrol [methylprednisolone] Itching 03/25/2017  . Miralax [polyethylene glycol] Itching 01/09/2013  . Red dye Other (See Comments) 01/09/2013  . Zyban [bupropion] Swelling 08/11/2013    Past Medical History:  Diagnosis Date  . Anxiety   . Dermatitis, seborrheic 2018  . Deviated nasal septum   . Diverticulitis   . Diverticulosis   . HTN (hypertension)   . Hyperlipemia   . Hyperplastic colon polyp   . Hyponatremia   . Hyponatremia   . MVP (mitral valve prolapse)   . Raynaud phenomenon   . Seizures ([place])   . SIADH (syndrome of inappropriate ADH production) ([place])     Past Surgical History:  Procedure Laterality Date  . ABDOMINAL HYSTERECTOMY    . CATARACT EXTRACTION, BILATERAL  2019  . CRYOTHERAPY  for cervical dysplasia  . NM MYOCAR PERF WALL MOTION  11/2007   bruce myoview; perfusion defect in anterior myocardium (breast attenuation); post-stress EF 77%; normal, low risk study   . TRANSTHORACIC ECHOCARDIOGRAM  11/2007   EF=>55%; mild MR; trace TR  . TUBAL LIGATION      Family History  Problem Relation Age of Onset  . Hyperlipidemia Father   . Hypertension Father   . Diabetes Father   . Atrial fibrillation Father   . Diabetes Paternal Grandmother   . Cancer Paternal  Grandfather        brain tumor  . Hyperlipidemia Other   . Hypertension Other   . Coronary artery disease Maternal Grandmother   . Diabetes Maternal Grandmother   . Hyperlipidemia Brother   . Transient ischemic attack Mother   . Dementia Mother   . Colon cancer Neg Hx     Social History   Socioeconomic History  . Marital status: Married    Spouse name: Candice Gray  . Number of children: 2  . Years of education: College  . Highest education level: Not on file  Occupational History  . Occupation: retired    Fish farm manager: UNEMPLOYED  Tobacco Use  . Smoking status: Former Smoker    Types: Cigarettes    Quit date: 06/05/1998    Years since quitting: 21.3  . Smokeless tobacco: Never Used  Substance and Sexual Activity  . Alcohol use: Yes    Alcohol/week: 4.0 standard drinks    Types: 4 Cans of beer per week    Comment: daily  . Drug use: Yes    Types: Marijuana    Comment: 04/11/18 yes marijuana  . Sexual activity: Not on file  Other Topics Concern  . Not on file  Social History Narrative   Lives at husband and 2 sons   Right-handed   Regular exercise--yes   Caffeine: daily coffee   Diet: fruits and veggies   Social Determinants of Health   Financial Resource Strain:   . Difficulty of Paying Living Expenses:   Food Insecurity:   . Worried About Charity fundraiser in the Last Year:   . Arboriculturist in the Last Year:   Transportation Needs:   . Film/video editor (Medical):   Marland Kitchen Lack of Transportation (Non-Medical):   Physical Activity:   . Days of Exercise per Week:   . Minutes of Exercise per Session:   Stress:   . Feeling of Stress :   Social Connections:   . Frequency of Communication with Friends and Family:   . Frequency of Social Gatherings with Friends and Family:   . Attends Religious Services:   . Active Member of Clubs or Organizations:   . Attends Archivist Meetings:   Marland Kitchen Marital Status:   Intimate Partner Violence:   . Fear of Current  or Ex-Partner:   . Emotionally Abused:   Marland Kitchen Physically Abused:   . Sexually Abused:       Review of systems: All other review of systems negative except as mentioned in the HPI.   Physical Exam: Vitals:   10/02/19 0848  BP: 130/90  Pulse: 72  Temp: (!) 97.2 F (36.2 C)   Body mass index is 18.7 kg/m. Gen:      No acute distress Abd:      soft, non-tender; no palpable masses, no distension Neuro: alert and oriented x 3 Psych: normal mood and affect  Data Reviewed:  Reviewed labs, radiology imaging,  old records and pertinent past GI work up   Assessment and Plan/Recommendations:  64 year old very pleasant female with history of hypertension, hyperlipidemia, SIADH, hyponatremia, seizure disorder and severe diverticulosis with history of diverticulitis here to discuss colorectal cancer screening  Patient is reluctant to undergo colonoscopy for colorectal cancer screening, discussed in detail the potential benefits and risks associated with the procedure as well as available alternatives  She is average risk for colorectal cancer screening Patient decided to proceed with Cologuard, is aware that if the test comes back positive she will need colonoscopy for follow-up and if Cologuard negative recommend recall in 3 years.  She has not had any episode of recurrent diverticulitis in the past 5 years, continue with high-fiber diet  Return as needed   The patient was provided an opportunity to ask questions and all were answered. The patient agreed with the plan and demonstrated an understanding of the instructions.  Damaris Hippo , MD    CC: Jinny Sanders, MD

## 2019-10-02 NOTE — Patient Instructions (Signed)
Your provider has ordered Cologuard testing as an option for colon cancer screening. This is performed by [employer] [facility] and may be out of network with your insurance. PRIOR to completing the test, it is YOUR responsibility to contact your insurance about covered benefits for this test. Your out of pocket expense could be anywhere from $0.00 to $649.00.   When you call to check coverage with your insurer, please provide the following information:   -The ONLY provider of Cologuard is [place] code for Cologuard is [phone].  [employer] NPI # [id number]  -[employer] Tax ID # [id number]   We have already sent your demographic and insurance information to [employer] [facility] (phone number [phone]) and they should contact you within the next week regarding your test. If you have not heard from them within the next week, please call our office at [phone].  Follow- up as needed pending results of Cologuard.   If you are age 64 or older, your body mass index should be between 23-30. Your Body mass index is 18.7 kg/m. If this is out of the aforementioned range listed, please consider follow up with your Primary Care Provider.  If you are age 70 or younger, your body mass index should be between 19-25. Your Body mass index is 18.7 kg/m. If this is out of the aformentioned range listed, please consider follow up with your Primary Care Provider.   Thank you for choosing me and [facility].  Dr.Nandigam

## 2019-10-20 LAB — EXTERNAL GENERIC LAB PROCEDURE: COLOGUARD: NEGATIVE

## 2019-10-20 LAB — COLOGUARD: COLOGUARD: NEGATIVE

## 2019-10-23 DIAGNOSIS — Z961 Presence of intraocular lens: Secondary | ICD-10-CM | POA: Diagnosis not present

## 2019-10-23 DIAGNOSIS — H401111 Primary open-angle glaucoma, right eye, mild stage: Secondary | ICD-10-CM | POA: Diagnosis not present

## 2019-10-23 DIAGNOSIS — H16103 Unspecified superficial keratitis, bilateral: Secondary | ICD-10-CM | POA: Diagnosis not present

## 2019-10-23 DIAGNOSIS — H401121 Primary open-angle glaucoma, left eye, mild stage: Secondary | ICD-10-CM | POA: Diagnosis not present

## 2019-10-30 ENCOUNTER — Encounter: Payer: Self-pay | Admitting: Neurology

## 2019-10-30 ENCOUNTER — Ambulatory Visit: Payer: BC Managed Care – PPO | Admitting: Neurology

## 2019-10-30 ENCOUNTER — Other Ambulatory Visit: Payer: Self-pay

## 2019-10-30 VITALS — BP 128/82 | HR 64 | Ht 68.0 in | Wt 120.0 lb

## 2019-10-30 DIAGNOSIS — R569 Unspecified convulsions: Secondary | ICD-10-CM

## 2019-10-30 MED ORDER — LAMOTRIGINE 100 MG PO TABS: 100.0000 mg | ORAL_TABLET | Freq: Two times a day (BID) | ORAL | 4 refills | Status: DC

## 2019-10-30 NOTE — Progress Notes (Signed)
PATIENT: Candice Gray DOB: [birth year]  REASON FOR VISIT: follow up HISTORY FROM: patient  HISTORY OF PRESENT ILLNESS: Today 10/30/19  HISTORY  HISTORY OF PRESENT ILLNESS: Candice Aly Clarkis a 64 years old right-handed female, seen in refer by her primary care doctor Jinny Sanders for evaluation of seizure on March 25 2016, initial evaluation was on April 01 2016.  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  She was put on water restriction 64 ounces daily ever since, is under close supervision of endocrinologist, she reported frequent thirsty recently, hasliberatedher water intake some,  On March 25 2016 around 10:00 in the morning, she noticed mild dizziness, sat down in the chair, lost consciousness, woke up on ambulance confused, she has a witnessed tonic-clonic seizure activity, her body becoming rigid, mouth open, eyes rolled back, lasting for a few minutes, followed by post event confusion.   I personally reviewed MRI of the brain on March 25 2016, mild generalized atrophy no acute abnormality   I reviewed her laboratory evaluation in November, glucose was 123, sodium was 129, potassium was 3.2, chloride was 90, glucose was 118, creatinine 0.78, WBC was mildly elevated 13, hemoglobin was 15 point 1, magnesium 1.6, A1c was 5.5, normal TSH, BNP was 112, magnesium 1.8, UDS was positive for marijuana, alcohol was less than 5, negative hepatitis C,  EEG showed: posterior dominant rhythm of 9 Hz reactive eye opening and closure., there are epileptiform discharges per description with maximum at F7 and T3. These do not evolve into electrographic seizures. No sleep was recorded.  She was treated with Keppra 500 mg 3 times a day, she complains of being anxious about her health, get irritated easily, she  does smoke marijuana regularly, now she has stopped smoking.  UPDATE Sep 29 2016:YY She is doing well, able to tolerate lamotrigine 100 mg twice a day much better, no recurrent seizure,  UPDATE October 30 2019: She is overall doing well, there was no recurrent seizure, tolerating lamotrigine 100 mg twice a day, still under close supervision of her primary care for hyponatremia, under water restriction of 64 ounce daily, she still use marijuana every day  Most recent laboratory evaluation November 2020, A1c 5.7, CMP showed mildly low sodium 132, chloride 93, calcium 10.8, lipid panel elevated cholesterol 231, LDL 100  REVIEW OF SYSTEMS: Out of a complete 14 system review of symptoms, the patient complains only of the following symptoms, and all other reviewed systems are negative.  Memory loss, seizures  ALLERGIES: Allergies  Allergen Reactions  . Azelastine Hcl     REACTION: increased bp  . Depo-Medrol [Methylprednisolone] Itching  . Miralax [Polyethylene Glycol] Itching  . Red Dye Other (See Comments)    Contact Dermatitis  . Zyban [Bupropion] Swelling    HOME MEDICATIONS: Outpatient Medications Prior to Visit  Medication Sig Dispense Refill  . amLODipine (NORVASC) 2.5 MG tablet Take 1 tablet (2.5 mg total) by mouth 2 (two) times daily. 180 tablet 3  . Biotin 1 MG CAPS Take 1 tablet by mouth daily.    . Calcium-Vitamin D (CALTRATE 600 PLUS-VIT D PO) Take 1 tablet by mouth daily.    . Cholecalciferol (VITAMIN D-3 PO) Take 1 capsule by mouth daily.    . hydrocortisone 2.5 % cream Apply 1 application topically as needed.     Marland Kitchen  lamoTRIgine (LAMICTAL) 100 MG tablet Take 1 tablet (100 mg total) by mouth 2 (two) times daily. 180 tablet 1  . latanoprost (XALATAN) 0.005 % ophthalmic solution Place 1 drop into both eyes at bedtime.    . metoprolol tartrate (LOPRESSOR) 25 MG tablet Take 0.5 tablets (12.5 mg total) by mouth 2 (two) times daily. 90 tablet 3  . vitamin C (ASCORBIC ACID) 500 MG  tablet Take 500 mg by mouth daily.    . zoledronic acid (RECLAST) 5 MG/100ML SOLN injection Inject 5 mg into the vein once.     No facility-administered medications prior to visit.    PAST MEDICAL HISTORY: Past Medical History:  Diagnosis Date  . Anxiety   . Dermatitis, seborrheic 2018  . Deviated nasal septum   . Diverticulitis   . Diverticulosis   . HTN (hypertension)   . Hyperlipemia   . Hyperplastic colon polyp   . Hyponatremia   . Hyponatremia   . MVP (mitral valve prolapse)   . Raynaud phenomenon   . Seizures ([place])   . SIADH (syndrome of inappropriate ADH production) ([place])     PAST SURGICAL HISTORY: Past Surgical History:  Procedure Laterality Date  . ABDOMINAL HYSTERECTOMY    . CATARACT EXTRACTION, BILATERAL  2019  . CRYOTHERAPY     for cervical dysplasia  . NM MYOCAR PERF WALL MOTION  11/2007   bruce myoview; perfusion defect in anterior myocardium (breast attenuation); post-stress EF 77%; normal, low risk study   . TRANSTHORACIC ECHOCARDIOGRAM  11/2007   EF=>55%; mild MR; trace TR  . TUBAL LIGATION      FAMILY HISTORY: Family History  Problem Relation Age of Onset  . Hyperlipidemia Father   . Hypertension Father   . Diabetes Father   . Atrial fibrillation Father   . Diabetes Paternal Grandmother   . Cancer Paternal Grandfather        brain tumor  . Hyperlipidemia Other   . Hypertension Other   . Coronary artery disease Maternal Grandmother   . Diabetes Maternal Grandmother   . Hyperlipidemia Brother   . Transient ischemic attack Mother   . Dementia Mother   . Colon cancer Neg Hx     SOCIAL HISTORY: Social History   Socioeconomic History  . Marital status: Married    Spouse name: Legrand Como  . Number of children: 2  . Years of education: College  . Highest education level: Not on file  Occupational History  . Occupation: retired    Fish farm manager: UNEMPLOYED  Tobacco Use  . Smoking status: Former Smoker    Types: Cigarettes    Quit date:  06/05/1998    Years since quitting: 21.4  . Smokeless tobacco: Never Used  Substance and Sexual Activity  . Alcohol use: Yes    Alcohol/week: 4.0 standard drinks    Types: 4 Cans of beer per week    Comment: daily  . Drug use: Yes    Types: Marijuana    Comment: 04/11/18 yes marijuana  . Sexual activity: Not on file  Other Topics Concern  . Not on file  Social History Narrative   Lives at husband and 2 sons   Right-handed   Regular exercise--yes   Caffeine: daily coffee   Diet: fruits and veggies   Social Determinants of Health   Financial Resource Strain:   . Difficulty of Paying Living Expenses:   Food Insecurity:   . Worried About Charity fundraiser in the Last Year:   . [employer] of  Food in the Last Year:   Transportation Needs:   . Film/video editor (Medical):   Marland Kitchen Lack of Transportation (Non-Medical):   Physical Activity:   . Days of Exercise per Week:   . Minutes of Exercise per Session:   Stress:   . Feeling of Stress :   Social Connections:   . Frequency of Communication with Friends and Family:   . Frequency of Social Gatherings with Friends and Family:   . Attends Religious Services:   . Active Member of Clubs or Organizations:   . Attends Archivist Meetings:   Marland Kitchen Marital Status:   Intimate Partner Violence:   . Fear of Current or Ex-Partner:   . Emotionally Abused:   Marland Kitchen Physically Abused:   . Sexually Abused:    PHYSICAL EXAM  Vitals:   10/30/19 1001  BP: 128/82  Pulse: 64  Weight: 120 lb (54.4 kg)  Height: 5\' 8"  (1.727 m)   Body mass index is 18.25 kg/m.  Generalized: Well developed, in no acute distress   Neurological examination  Mentation: Alert oriented to time, place, history taking. Follows all commands speech and language fluent Cranial nerve II-XII: Pupils were equal round reactive to light. Extraocular movements were full, visual field were full on confrontational test. Facial sensation and strength were normal. Head  turning and shoulder shrug  were normal and symmetric. Motor: Normal tone, bulk, and strength. Sensory: Sensory testing is intact to light touch and vibratory sensation Coordination: No limb or truncal ataxia Gait and station: Gait is normal. Tandem gait is normal. Romberg is negative.  Reflexes: Deep tendon reflexes are symmetric and normal bilaterally.   DIAGNOSTIC DATA (LABS, IMAGING, TESTING) - I reviewed patient records, labs, notes, testing and imaging myself where available.  Lab Results  Component Value Date   WBC 9.5 10/31/2018   HGB 14.4 10/31/2018   HCT 39.9 10/31/2018   MCV 95 10/31/2018   PLT 266 10/31/2018      Component Value Date/Time   NA 132 (L) 03/27/2019 0815   NA 129 (L) 10/31/2018 1040   K 4.4 03/27/2019 0815   CL 93 (L) 03/27/2019 0815   CO2 31 03/27/2019 0815   GLUCOSE 86 03/27/2019 0815   BUN 14 03/27/2019 0815   BUN 8 10/31/2018 1040   CREATININE 0.71 03/27/2019 0815   CALCIUM 10.8 (H) 03/27/2019 0815   PROT 7.5 03/27/2019 0815   PROT 7.1 10/31/2018 1040   ALBUMIN 4.6 03/27/2019 0815   ALBUMIN 4.7 10/31/2018 1040   AST 16 03/27/2019 0815   ALT 12 03/27/2019 0815   ALKPHOS 50 03/27/2019 0815   BILITOT 0.3 03/27/2019 0815   BILITOT 0.4 10/31/2018 1040   GFRNONAA 93 10/31/2018 1040   GFRAA 107 10/31/2018 1040   Lab Results  Component Value Date   CHOL 231 (H) 03/27/2019   HDL 123.30 03/27/2019   LDLCALC 100 (H) 03/27/2019   LDLDIRECT 74.6 12/31/2011   TRIG 41.0 03/27/2019   CHOLHDL 2 03/27/2019   Lab Results  Component Value Date   HGBA1C 5.7 03/27/2019   Lab Results  Component Value Date   VITAMINB12 390 12/20/2012   Lab Results  Component Value Date   TSH 1.824 03/25/2016    ASSESSMENT AND PLAN 64 y.o. year old female  Seizure on March 25, 2016  In the setting of hyponatremia, sodium was 129, she is still under water restriction, close supervision of her primary care for persistent low sodium, most recent sodium level was  132 on March 27, 2019  MRI of the brain showed generalized atrophy, no acute abnormality,  EEG showed focal slowing of left to mid temporal lobe,  She tolerating lamotrigine 100 mg twice a day, refilled her prescription, return to clinic in 1 year  Marcial Pacas, M.D. Ph.D.  [facility] [place], [place] [address] Phone: [phone] Fax:      [phone]

## 2019-12-10 ENCOUNTER — Other Ambulatory Visit: Payer: Self-pay

## 2019-12-10 LAB — COLOGUARD: Cologuard: NEGATIVE

## 2020-03-03 DIAGNOSIS — Z1231 Encounter for screening mammogram for malignant neoplasm of breast: Secondary | ICD-10-CM | POA: Diagnosis not present

## 2020-03-03 LAB — HM MAMMOGRAPHY

## 2020-03-04 DIAGNOSIS — E222 Syndrome of inappropriate secretion of antidiuretic hormone: Secondary | ICD-10-CM | POA: Diagnosis not present

## 2020-03-04 DIAGNOSIS — E210 Primary hyperparathyroidism: Secondary | ICD-10-CM | POA: Diagnosis not present

## 2020-03-04 DIAGNOSIS — Z23 Encounter for immunization: Secondary | ICD-10-CM | POA: Diagnosis not present

## 2020-03-04 DIAGNOSIS — M810 Age-related osteoporosis without current pathological fracture: Secondary | ICD-10-CM | POA: Diagnosis not present

## 2020-03-13 ENCOUNTER — Encounter: Payer: Self-pay | Admitting: Family Medicine

## 2020-03-26 DIAGNOSIS — M810 Age-related osteoporosis without current pathological fracture: Secondary | ICD-10-CM | POA: Diagnosis not present

## 2020-03-27 ENCOUNTER — Telehealth: Payer: Self-pay | Admitting: Family Medicine

## 2020-03-27 DIAGNOSIS — R7303 Prediabetes: Secondary | ICD-10-CM

## 2020-03-27 DIAGNOSIS — E785 Hyperlipidemia, unspecified: Secondary | ICD-10-CM

## 2020-03-27 NOTE — Telephone Encounter (Signed)
-----   Message from Cloyd Stagers, RT sent at 03/13/2020  2:09 PM EDT ----- Regarding: Lab Orders for Friday 11.5.2021 Please place lab orders for Friday 11.5.2021, office visit for physical on Friday 11.12.2021 Thank you, Dyke Maes RT(R)

## 2020-03-28 ENCOUNTER — Other Ambulatory Visit: Payer: Self-pay

## 2020-03-28 ENCOUNTER — Other Ambulatory Visit (INDEPENDENT_AMBULATORY_CARE_PROVIDER_SITE_OTHER): Payer: BC Managed Care – PPO

## 2020-03-28 DIAGNOSIS — E785 Hyperlipidemia, unspecified: Secondary | ICD-10-CM | POA: Diagnosis not present

## 2020-03-28 DIAGNOSIS — R7303 Prediabetes: Secondary | ICD-10-CM

## 2020-03-28 LAB — COMPREHENSIVE METABOLIC PANEL
ALT: 12 U/L (ref 0–35)
AST: 18 U/L (ref 0–37)
Albumin: 4.4 g/dL (ref 3.5–5.2)
Alkaline Phosphatase: 45 U/L (ref 39–117)
BUN: 10 mg/dL (ref 6–23)
CO2: 28 mEq/L (ref 19–32)
Calcium: 10 mg/dL (ref 8.4–10.5)
Chloride: 95 mEq/L — ABNORMAL LOW (ref 96–112)
Creatinine, Ser: 0.73 mg/dL (ref 0.40–1.20)
GFR: 86.86 mL/min (ref >60.00)
Glucose, Bld: 131 mg/dL — ABNORMAL HIGH (ref 70–99)
Potassium: 4.1 mEq/L (ref 3.5–5.1)
Sodium: 131 mEq/L — ABNORMAL LOW (ref 135–145)
Total Bilirubin: 0.6 mg/dL (ref 0.2–1.2)
Total Protein: 7.2 g/dL (ref 6.0–8.3)

## 2020-03-28 LAB — LIPID PANEL
Cholesterol: 222 mg/dL — ABNORMAL HIGH (ref 0–200)
HDL: 126.7 mg/dL (ref >39.00)
LDL Cholesterol: 87 mg/dL (ref 0–99)
NonHDL: 95.19
Total CHOL/HDL Ratio: 2
Triglycerides: 42 mg/dL (ref 0.0–149.0)
VLDL: 8.4 mg/dL (ref 0.0–40.0)

## 2020-03-28 LAB — HEMOGLOBIN A1C: Hgb A1c MFr Bld: 5.9 % (ref 4.6–6.5)

## 2020-03-28 NOTE — Progress Notes (Signed)
No critical labs need to be addressed urgently. We will discuss labs in detail at upcoming office visit.   

## 2020-04-04 ENCOUNTER — Encounter: Payer: Self-pay | Admitting: Family Medicine

## 2020-04-04 ENCOUNTER — Ambulatory Visit (INDEPENDENT_AMBULATORY_CARE_PROVIDER_SITE_OTHER): Payer: BC Managed Care – PPO | Admitting: Family Medicine

## 2020-04-04 ENCOUNTER — Other Ambulatory Visit: Payer: Self-pay

## 2020-04-04 ENCOUNTER — Other Ambulatory Visit (HOSPITAL_COMMUNITY)
Admission: RE | Admit: 2020-04-04 | Discharge: 2020-04-04 | Disposition: A | Discharge status: Home / Self Care | Payer: BC Managed Care – PPO | Source: Ambulatory Visit | Attending: Family Medicine | Admitting: Family Medicine

## 2020-04-04 VITALS — BP 130/80 | HR 67 | Temp 98.3°F | Ht 68.0 in | Wt 123.0 lb

## 2020-04-04 DIAGNOSIS — E785 Hyperlipidemia, unspecified: Secondary | ICD-10-CM

## 2020-04-04 DIAGNOSIS — R569 Unspecified convulsions: Secondary | ICD-10-CM

## 2020-04-04 DIAGNOSIS — I10 Essential (primary) hypertension: Secondary | ICD-10-CM

## 2020-04-04 DIAGNOSIS — Z0000 Encounter for general adult medical examination without abnormal findings: Secondary | ICD-10-CM

## 2020-04-04 DIAGNOSIS — Z124 Encounter for screening for malignant neoplasm of cervix: Secondary | ICD-10-CM | POA: Diagnosis not present

## 2020-04-04 DIAGNOSIS — E213 Hyperparathyroidism, unspecified: Secondary | ICD-10-CM

## 2020-04-04 DIAGNOSIS — E222 Syndrome of inappropriate secretion of antidiuretic hormone: Secondary | ICD-10-CM

## 2020-04-04 NOTE — Assessment & Plan Note (Signed)
Followed by ENDO, Dr. Buddy Duty.

## 2020-04-04 NOTE — Assessment & Plan Note (Signed)
Normal calcium followe dby ENDO.  DEXA improved on reclast.

## 2020-04-04 NOTE — Progress Notes (Signed)
Chief Complaint  Patient presents with  . Annual Exam    Vision--10/2019.... Covid--never...     History of Present Illness: HPI   The patient is here for annual wellness exam and preventative care.      CBG 131.. was non-fasting. 96 fasting at ENDO.  Elevated Cholesterol:  Improved control. Lab Results  Component Value Date   CHOL 222 (H) 03/28/2020   HDL 126.70 03/28/2020   LDLCALC 87 03/28/2020   LDLDIRECT 74.6 12/31/2011   TRIG 42.0 03/28/2020   CHOLHDL 2 03/28/2020  Using medications without problems: Muscle aches:  Diet compliance: healthy Exercise: walking Other complaints:  Hypertension:   good control on amlodipine, metoprolol.  BP Readings from Last 3 Encounters:  04/04/20 130/80  10/30/19 128/82  10/02/19 130/90  Using medication without problems or lightheadedness:  none Chest pain with exertion:none Edema:none Short of breath:none Average home BPs: Other issues:  Hyperparathyroid,SIADH, low mg followed by ENDO Dr. Buddy Duty.   Mother passed away 10/22/2019  Seizures:  No seizures. Stable on lamictal for 4 years. Neuro  Dr. Krista Blue.   prediabetes   Lab Results  Component Value Date   HGBA1C 5.9 03/28/2020      This visit occurred during the SARS-CoV-2 public health emergency.  Safety protocols were in place, including screening questions prior to the visit, additional usage of staff PPE, and extensive cleaning of exam room while observing appropriate contact time as indicated for disinfecting solutions.   COVID 19 screen:  No recent travel or known exposure to COVID19 The patient denies respiratory symptoms of COVID 19 at this time. The importance of social distancing was discussed today.     Review of Systems  Constitutional: Negative for chills and fever.  HENT: Negative for congestion and ear pain.   Eyes: Negative for pain and redness.  Respiratory: Negative for cough and shortness of breath.   Cardiovascular: Negative for chest pain,  palpitations and leg swelling.  Gastrointestinal: Negative for abdominal pain, blood in stool, constipation, diarrhea, nausea and vomiting.  Genitourinary: Negative for dysuria.  Musculoskeletal: Negative for falls and myalgias.  Skin: Negative for rash.  Neurological: Negative for dizziness.  Psychiatric/Behavioral: Negative for depression. The patient is not nervous/anxious.       Past Medical History:  Diagnosis Date  . Anxiety   . Dermatitis, seborrheic 2018  . Deviated nasal septum   . Diverticulitis   . Diverticulosis   . HTN (hypertension)   . Hyperlipemia   . Hyperplastic colon polyp   . Hyponatremia   . Hyponatremia   . MVP (mitral valve prolapse)   . Raynaud phenomenon   . Seizures ([place])   . SIADH (syndrome of inappropriate ADH production) ([place])     reports that she quit smoking about 21 years ago. Her smoking use included cigarettes. She has never used smokeless tobacco. She reports current alcohol use of about 4.0 standard drinks of alcohol per week. She reports current drug use. Drug: Marijuana.   Current Outpatient Medications:  .  amLODipine (NORVASC) 2.5 MG tablet, Take 1 tablet (2.5 mg total) by mouth 2 (two) times daily., Disp: 180 tablet, Rfl: 3 .  Biotin 1 MG CAPS, Take 1 tablet by mouth daily., Disp: , Rfl:  .  Calcium-Vitamin D (CALTRATE 600 PLUS-VIT D PO), Take 1 tablet by mouth daily., Disp: , Rfl:  .  Cholecalciferol (VITAMIN D-3 PO), Take 1 capsule by mouth daily., Disp: , Rfl:  .  hydrocortisone 2.5 % cream, Apply 1 application topically as  needed. , Disp: , Rfl:  .  lamoTRIgine (LAMICTAL) 100 MG tablet, Take 1 tablet (100 mg total) by mouth 2 (two) times daily., Disp: 180 tablet, Rfl: 4 .  latanoprost (XALATAN) 0.005 % ophthalmic solution, Place 1 drop into both eyes at bedtime., Disp: , Rfl:  .  metoprolol tartrate (LOPRESSOR) 25 MG tablet, Take 0.5 tablets (12.5 mg total) by mouth 2 (two) times daily., Disp: 90 tablet, Rfl: 3 .  vitamin C (ASCORBIC  ACID) 500 MG tablet, Take 500 mg by mouth daily., Disp: , Rfl:  .  zoledronic acid (RECLAST) 5 MG/100ML SOLN injection, Inject 5 mg into the vein once., Disp: , Rfl:    Observations/Objective: Blood pressure 130/80, pulse 67, temperature 98.3 F (36.8 C), temperature source Temporal, height 5\' 8"  (1.727 m), weight 123 lb (55.8 kg), SpO2 98 %.  Physical Exam Constitutional:      General: She is not in acute distress.    Appearance: Normal appearance. She is well-developed. She is not ill-appearing or toxic-appearing.  HENT:     Head: Normocephalic.     Right Ear: Hearing, tympanic membrane, ear canal and external ear normal.     Left Ear: Hearing, tympanic membrane, ear canal and external ear normal.     Nose: Nose normal.  Eyes:     General: Lids are normal. Lids are everted, no foreign bodies appreciated.     Conjunctiva/sclera: Conjunctivae normal.     Pupils: Pupils are equal, round, and reactive to light.  Neck:     Thyroid: No thyroid mass or thyromegaly.     Vascular: No carotid bruit.     Trachea: Trachea normal.  Cardiovascular:     Rate and Rhythm: Normal rate and regular rhythm.     Heart sounds: Normal heart sounds, S1 normal and S2 normal. No murmur heard.  No gallop.   Pulmonary:     Effort: Pulmonary effort is normal. No respiratory distress.     Breath sounds: Normal breath sounds. No wheezing, rhonchi or rales.  Abdominal:     General: Bowel sounds are normal. There is no distension or abdominal bruit.     Palpations: Abdomen is soft. There is no fluid wave or mass.     Tenderness: There is no abdominal tenderness. There is no guarding or rebound.     Hernia: No hernia is present.  Genitourinary:    Exam position: Supine.     Labia:        Right: No rash, tenderness or lesion.        Left: No rash, tenderness or lesion.      Vagina: Normal.     Cervix: No cervical motion tenderness, discharge or friability.     Uterus: Not enlarged and not tender.       Adnexa:        Right: No mass, tenderness or fullness.         Left: No mass, tenderness or fullness.    Musculoskeletal:     Cervical back: Normal range of motion and neck supple.  Lymphadenopathy:     Cervical: No cervical adenopathy.  Skin:    General: Skin is warm and dry.     Findings: No rash.  Neurological:     Mental Status: She is alert.     Cranial Nerves: No cranial nerve deficit.     Sensory: No sensory deficit.  Psychiatric:        Mood and Affect: Mood is not anxious or depressed.  Speech: Speech normal.        Behavior: Behavior normal. Behavior is cooperative.        Judgment: Judgment normal.      Assessment and Plan   The patient's preventative maintenance and recommended screening tests for an annual wellness exam were reviewed in full today. Brought up to date unless services declined.  Counselled on the importance of diet, exercise, and its role in overall health and mortality. The patient's FH and SH was reviewed, including their home life, tobacco status, and drug and alcohol status.   Last DEXA 2015 worsening osteopenia, stopped boniva. 2017 osteoporosisENDO treating..Started Reclast 2019.Marland Kitchen  Had DXA 05/2019 Last colon: 2011 was bengin polyps.. No repeat screen in this way given small caliber colon. Negative Cologuard in 09/2019 Mammo nml 10/2021nml Pap q 5years, last nml in 2016, neg HPV,DVE  DUE Counseled against marijuana use.  Nonsmoker Hep C: neg Vaccines: refused COVID,  Uptodate with td and flu, consider shingrix  Hyperlipidemia IMproved control.  HYPERTENSION, BENIGN ESSENTIAL, LABILE Well controlled. Continue current medication.   SIADH (syndrome of inappropriate ADH production) ([place])  Followed by ENDO, Dr. Buddy Duty.  Seizures ([place]) Stable on lamictal  Hyperparathyroidism ([place])  Normal calcium followe dby ENDO.  DEXA improved on reclast.   Eliezer Lofts, MD

## 2020-04-04 NOTE — Assessment & Plan Note (Signed)
Well controlled. Continue current medication.  

## 2020-04-04 NOTE — Patient Instructions (Addendum)
Consider shingles vaccine and COVID vaccine.  Keep working on healthy lifestyle.

## 2020-04-04 NOTE — Assessment & Plan Note (Signed)
IMproved control.

## 2020-04-04 NOTE — Assessment & Plan Note (Signed)
Stable on lamictal 

## 2020-04-04 NOTE — Addendum Note (Signed)
Addended by: Lurlean Nanny on: 04/04/2020 01:18 PM   Modules accepted: Orders

## 2020-04-08 LAB — CYTOLOGY - PAP: Diagnosis: NEGATIVE

## 2020-04-28 DIAGNOSIS — H04123 Dry eye syndrome of bilateral lacrimal glands: Secondary | ICD-10-CM | POA: Diagnosis not present

## 2020-04-28 DIAGNOSIS — Z961 Presence of intraocular lens: Secondary | ICD-10-CM | POA: Diagnosis not present

## 2020-04-28 DIAGNOSIS — H401132 Primary open-angle glaucoma, bilateral, moderate stage: Secondary | ICD-10-CM | POA: Diagnosis not present

## 2020-06-11 ENCOUNTER — Other Ambulatory Visit: Payer: Self-pay | Admitting: Family Medicine

## 2020-10-29 ENCOUNTER — Ambulatory Visit (INDEPENDENT_AMBULATORY_CARE_PROVIDER_SITE_OTHER): Payer: Medicare Other | Admitting: Neurology

## 2020-10-29 ENCOUNTER — Encounter: Payer: Self-pay | Admitting: Neurology

## 2020-10-29 VITALS — BP 170/89 | HR 72 | Ht 68.0 in | Wt 120.0 lb

## 2020-10-29 DIAGNOSIS — R569 Unspecified convulsions: Secondary | ICD-10-CM

## 2020-10-29 MED ORDER — LAMOTRIGINE 100 MG PO TABS: 100.0000 mg | ORAL_TABLET | Freq: Two times a day (BID) | ORAL | 4 refills | Status: DC

## 2020-10-29 NOTE — Patient Instructions (Signed)
Check labs today  Continue Lamictal at current dosing for now Document spells, let me know  See you back in 1 year

## 2020-10-29 NOTE — Progress Notes (Signed)
PATIENT: Candice Gray DOB: 31-Oct-1955  REASON FOR VISIT: follow up HISTORY FROM: patient  HISTORY OF PRESENT ILLNESS: Today 10/29/20 HISTORY OF PRESENT ILLNESS: Candice Gray a 65 years old right-handed female, seen in refer by her primary care doctor Jinny Sanders for evaluation of seizure on March 25 2016, initial evaluation was on April 01 2016.  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  She was put on water restriction 64 ounces daily ever since, is under close supervision of endocrinologist, she reported frequent thirsty recently, hasliberatedher water intake some,  On March 25 2016 around 10:00 in the morning, she noticed mild dizziness, sat down in the chair, lost consciousness, woke up on ambulance confused, she has a witnessed tonic-clonic seizure activity, her body becoming rigid, mouth open, eyes rolled back, lasting for a few minutes, followed by post event confusion.   I personally reviewed MRI of the brain on March 25 2016, mild generalized atrophy no acute abnormality   I reviewed her laboratory evaluation in November, glucose was 123, sodium was 129, potassium was 3.2, chloride was 90, glucose was 118, creatinine 0.78, WBC was mildly elevated 13, hemoglobin was 15 point 1, magnesium 1.6, A1c was 5.5, normal TSH, BNP was 112, magnesium 1.8, UDS was positive for marijuana, alcohol was less than 5, negative hepatitis C,  EEG showed: posterior dominant rhythm of 9 Hz reactive eye opening and closure., there are epileptiform discharges per description with maximum at F7 and T3. These do not evolve into electrographic seizures. No sleep was recorded.  She was treated with Keppra 500 mg 3 times a day, she complains of being anxious about her health, get irritated easily, she does smoke  marijuana regularly, now she has stopped smoking.  UPDATE Sep 29 2016:YY She is doing well, able to tolerate lamotrigine 100 mg twice a day much better, no recurrent seizure,  UPDATE October 30 2019: She is overall doing well, there was no recurrent seizure, tolerating lamotrigine 100 mg twice a day, still under close supervision of her primary care for hyponatremia, under water restriction of 64 ounce daily, she still use marijuana every day  Most recent laboratory evaluation November 2020, A1c 5.7, CMP showed mildly low sodium 132, chloride 93, calcium 10.8, lipid panel elevated cholesterol 231, LDL 100  Update October 29, 2020 SS: Reports last Friday night, 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 Spell last year after mother died, pure exhaustion from planning service, babbling talking to friend, got something to eat, felt much better. Remains on Lamictal 100 mg twice daily. BP was higher last week than normal, can sometimes indicate sodium level was low. Sees endocrinology at [facility]. Is on fluid restriction, 64 oz is daily limit, generous with salt. Sodium 131 in Nov 2021. Only 1 seizure ever, not really sure what to look for with seizures.  REVIEW OF SYSTEMS: Out of a complete 14 system review of symptoms, the patient complains only of the following symptoms, and all other reviewed systems are negative.  See HPI  ALLERGIES: Allergies  Allergen Reactions  . Azelastine Hcl     REACTION: increased bp  . Depo-Medrol [Methylprednisolone] Itching  . Miralax [Polyethylene Glycol] Itching  . Red Dye Other (  See Comments)    Contact Dermatitis  . Zyban [Bupropion] Swelling    HOME MEDICATIONS: Outpatient Medications Prior to Visit  Medication Sig Dispense Refill  . amLODipine (NORVASC) 2.5 MG tablet Take 1 tablet (2.5 mg total) by mouth 2 (two) times daily. 180 tablet 3  . Biotin 1 MG CAPS  Take 1 tablet by mouth daily.    . Calcium-Vitamin D (CALTRATE 600 PLUS-VIT D PO) Take 1 tablet by mouth daily.    . Cholecalciferol (VITAMIN D-3 PO) Take 1 capsule by mouth daily.    . hydrocortisone 2.5 % cream Apply 1 application topically as needed.     . lamoTRIgine (LAMICTAL) 100 MG tablet Take 1 tablet (100 mg total) by mouth 2 (two) times daily. 180 tablet 4  . latanoprost (XALATAN) 0.005 % ophthalmic solution Place 1 drop into both eyes at bedtime.    . metoprolol tartrate (LOPRESSOR) 25 MG tablet TAKE 1/2 TABLET BY MOUTH TWICE DAILY 90 tablet 3  . vitamin C (ASCORBIC ACID) 500 MG tablet Take 500 mg by mouth daily.    . zoledronic acid (RECLAST) 5 MG/100ML SOLN injection Inject 5 mg into the vein once.     No facility-administered medications prior to visit.    PAST MEDICAL HISTORY: Past Medical History:  Diagnosis Date  . Anxiety   . Dermatitis, seborrheic 2018  . Deviated nasal septum   . Diverticulitis   . Diverticulosis   . HTN (hypertension)   . Hyperlipemia   . Hyperplastic colon polyp   . Hyponatremia   . Hyponatremia   . MVP (mitral valve prolapse)   . Raynaud phenomenon   . Seizures ([place])   . SIADH (syndrome of inappropriate ADH production) ([place])     PAST SURGICAL HISTORY: Past Surgical History:  Procedure Laterality Date  . ABDOMINAL HYSTERECTOMY    . CATARACT EXTRACTION, BILATERAL  2019  . CRYOTHERAPY     for cervical dysplasia  . NM MYOCAR PERF WALL MOTION  11/2007   bruce myoview; perfusion defect in anterior myocardium (breast attenuation); post-stress EF 77%; normal, low risk study   . TRANSTHORACIC ECHOCARDIOGRAM  11/2007   EF=>55%; mild MR; trace TR  . TUBAL LIGATION      FAMILY HISTORY: Family History  Problem Relation Age of Onset  . Hyperlipidemia Father   . Hypertension Father   . Diabetes Father   . Atrial fibrillation Father   . Diabetes Paternal Grandmother   . Cancer Paternal Grandfather        brain tumor  . Hyperlipidemia Other    . Hypertension Other   . Coronary artery disease Maternal Grandmother   . Diabetes Maternal Grandmother   . Hyperlipidemia Brother   . Transient ischemic attack Mother   . Dementia Mother   . Colon cancer Neg Hx     SOCIAL HISTORY: Social History   Socioeconomic History  . Marital status: Married    Spouse name: Legrand Como  . Number of children: 2  . Years of education: College  . Highest education level: Not on file  Occupational History  . Occupation: retired    Fish farm manager: UNEMPLOYED  Tobacco Use  . Smoking status: Former Smoker    Types: Cigarettes    Quit date: 06/05/1998    Years since quitting: 22.4  . Smokeless tobacco: Never Used  Substance and Sexual Activity  . Alcohol use: Yes    Alcohol/week: 4.0 standard drinks    Types: 4 Cans of beer per week    Comment:  daily  . Drug use: Yes    Types: Marijuana    Comment: 04/11/18 yes marijuana  . Sexual activity: Not on file  Other Topics Concern  . Not on file  Social History Narrative   Lives at husband and 2 sons   Right-handed   Regular exercise--yes   Caffeine: daily coffee   Diet: fruits and veggies   Social Determinants of Health   Financial Resource Strain: Not on file  Food Insecurity: Not on file  Transportation Needs: Not on file  Physical Activity: Not on file  Stress: Not on file  Social Connections: Not on file  Intimate Partner Violence: Not on file   PHYSICAL EXAM  Vitals:   10/29/20 1012  BP: (!) 170/89  Pulse: 72  Weight: 120 lb (54.4 kg)  Height: 5\' 8"  (1.727 m)   Body mass index is 18.25 kg/m.  Generalized: Well developed, in no acute distress   Neurological examination  Mentation: Alert oriented to time, place, history taking. Follows all commands speech and language fluent Cranial nerve II-XII: Pupils were equal round reactive to light. Extraocular movements were full, visual field were full on confrontational test. Facial sensation and strength were normal. Head turning and  shoulder shrug were normal and symmetric. Motor: 5/5 strength bilaterally  Sensory: Sensory testing is intact to light touch and vibratory sensation Coordination: Finger nose finger and heel to shin was normal bilaterally  Gait and station: Gait is normal.  Reflexes: Deep tendon reflexes are symmetric and normal bilaterally.   DIAGNOSTIC DATA (LABS, IMAGING, TESTING) - I reviewed patient records, labs, notes, testing and imaging myself where available.  Lab Results  Component Value Date   WBC 9.5 10/31/2018   HGB 14.4 10/31/2018   HCT 39.9 10/31/2018   MCV 95 10/31/2018   PLT 266 10/31/2018      Component Value Date/Time   NA 131 (L) 03/28/2020 0812   NA 129 (L) 10/31/2018 1040   K 4.1 03/28/2020 0812   CL 95 (L) 03/28/2020 0812   CO2 28 03/28/2020 0812   GLUCOSE 131 (H) 03/28/2020 0812   BUN 10 03/28/2020 0812   BUN 8 10/31/2018 1040   CREATININE 0.73 03/28/2020 0812   CALCIUM 10.0 03/28/2020 0812   PROT 7.2 03/28/2020 0812   PROT 7.1 10/31/2018 1040   ALBUMIN 4.4 03/28/2020 0812   ALBUMIN 4.7 10/31/2018 1040   AST 18 03/28/2020 0812   ALT 12 03/28/2020 0812   ALKPHOS 45 03/28/2020 0812   BILITOT 0.6 03/28/2020 0812   BILITOT 0.4 10/31/2018 1040   GFRNONAA 93 10/31/2018 1040   GFRAA 107 10/31/2018 1040   Lab Results  Component Value Date   CHOL 222 (H) 03/28/2020   HDL 126.70 03/28/2020   LDLCALC 87 03/28/2020   LDLDIRECT 74.6 12/31/2011   TRIG 42.0 03/28/2020   CHOLHDL 2 03/28/2020   Lab Results  Component Value Date   HGBA1C 5.9 03/28/2020   Lab Results  Component Value Date   VITAMINB12 390 12/20/2012   Lab Results  Component Value Date   TSH 1.824 03/25/2016    ASSESSMENT AND PLAN 65 y.o. year old female  1. Seizure on March 25, 2016 -Seizure episode occurred in setting of hyponatremia in 2017, sodium level was 129, continues to follow with endocrinology, on fluid restriction -Question episode last week, trouble focusing, unclear seizure  activity?   -Check routine labs, including sodium level, last was 131 in Nov 2021, low sodium recent trigger?  -Continue Lamictal 100 mg  twice a day, has done well on current dosing since initiation -MRI of the brain showed generalized atrophy, no acute abnormality -EEG showed focal slowing of left to mid temporal lobe -Document any spells, follow-up 1 year or sooner if needed  Butler Denmark, Laqueta Jean, DNP  [facility] [address] [place], [place] [address] [phone]

## 2020-10-30 LAB — CBC WITH DIFFERENTIAL/PLATELET
Basophils Absolute: 0 10*3/uL (ref 0.0–0.2)
Basos: 0 %
EOS (ABSOLUTE): 0.1 10*3/uL (ref 0.0–0.4)
Eos: 1 %
Hematocrit: 40.8 % (ref 34.0–46.6)
Hemoglobin: 14.1 g/dL (ref 11.1–15.9)
Immature Grans (Abs): 0.1 10*3/uL (ref 0.0–0.1)
Immature Granulocytes: 1 %
Lymphocytes Absolute: 2.7 10*3/uL (ref 0.7–3.1)
Lymphs: 27 %
MCH: 32.9 pg (ref 26.6–33.0)
MCHC: 34.6 g/dL (ref 31.5–35.7)
MCV: 95 fL (ref 79–97)
Monocytes Absolute: 1 10*3/uL — ABNORMAL HIGH (ref 0.1–0.9)
Monocytes: 10 %
Neutrophils Absolute: 6.1 10*3/uL (ref 1.4–7.0)
Neutrophils: 61 %
Platelets: 243 10*3/uL (ref 150–450)
RBC: 4.28 x10E6/uL (ref 3.77–5.28)
RDW: 11.6 % — ABNORMAL LOW (ref 11.7–15.4)
WBC: 10 10*3/uL (ref 3.4–10.8)

## 2020-10-30 LAB — COMPREHENSIVE METABOLIC PANEL
ALT: 15 IU/L (ref 0–32)
AST: 21 IU/L (ref 0–40)
Albumin/Globulin Ratio: 1.7 (ref 1.2–2.2)
Albumin: 4.8 g/dL (ref 3.8–4.8)
Alkaline Phosphatase: 61 IU/L (ref 44–121)
BUN/Creatinine Ratio: 16 (ref 12–28)
BUN: 12 mg/dL (ref 8–27)
Bilirubin Total: 0.3 mg/dL (ref 0.0–1.2)
CO2: 24 mmol/L (ref 20–29)
Calcium: 10.4 mg/dL — ABNORMAL HIGH (ref 8.7–10.3)
Chloride: 93 mmol/L — ABNORMAL LOW (ref 96–106)
Creatinine, Ser: 0.75 mg/dL (ref 0.57–1.00)
Globulin, Total: 2.8 g/dL (ref 1.5–4.5)
Glucose: 111 mg/dL — ABNORMAL HIGH (ref 65–99)
Potassium: 4.5 mmol/L (ref 3.5–5.2)
Sodium: 132 mmol/L — ABNORMAL LOW (ref 134–144)
Total Protein: 7.6 g/dL (ref 6.0–8.5)
eGFR: 88 mL/min/{1.73_m2} (ref >59)

## 2020-10-30 LAB — LAMOTRIGINE LEVEL: Lamotrigine Lvl: 6.2 ug/mL (ref 2.0–20.0)

## 2021-01-21 ENCOUNTER — Other Ambulatory Visit: Payer: Self-pay | Admitting: Family Medicine

## 2021-02-04 NOTE — Progress Notes (Signed)
Chart reviewed, agree above plan ?

## 2021-03-04 LAB — COMPREHENSIVE METABOLIC PANEL
Albumin: 4.6 (ref 3.5–5.0)
Calcium: 10.2 (ref 8.7–10.7)
GFR calc non Af Amer: 89

## 2021-03-04 LAB — BASIC METABOLIC PANEL
BUN: 14 (ref 4–21)
CO2: 30 — AB (ref 13–22)
Chloride: 94 — AB (ref 99–108)
Creatinine: 0.7 (ref 0.5–1.1)
Glucose: 116
Potassium: 4.4 (ref 3.4–5.3)
Sodium: 132 — AB (ref 137–147)

## 2021-03-04 LAB — HEPATIC FUNCTION PANEL
ALT: 15 (ref 7–35)
AST: 20 (ref 13–35)
Alkaline Phosphatase: 45 (ref 25–125)
Bilirubin, Total: 0.6

## 2021-03-04 LAB — HM MAMMOGRAPHY

## 2021-03-05 ENCOUNTER — Encounter: Payer: Self-pay | Admitting: Family Medicine

## 2021-03-25 ENCOUNTER — Telehealth: Payer: Self-pay | Admitting: Family Medicine

## 2021-03-25 DIAGNOSIS — E213 Hyperparathyroidism, unspecified: Secondary | ICD-10-CM

## 2021-03-25 DIAGNOSIS — E785 Hyperlipidemia, unspecified: Secondary | ICD-10-CM

## 2021-03-25 DIAGNOSIS — R7303 Prediabetes: Secondary | ICD-10-CM

## 2021-03-25 NOTE — Telephone Encounter (Signed)
-----   Message from Ellamae Sia sent at 03/25/2021  8:52 AM EDT ----- Regarding: Lab orders for Friday,11.11.22  AWV lab orders, please.

## 2021-04-03 ENCOUNTER — Other Ambulatory Visit: Payer: BC Managed Care – PPO

## 2021-04-10 ENCOUNTER — Other Ambulatory Visit: Payer: Self-pay

## 2021-04-10 ENCOUNTER — Ambulatory Visit (INDEPENDENT_AMBULATORY_CARE_PROVIDER_SITE_OTHER): Payer: Medicare Other | Admitting: Family Medicine

## 2021-04-10 ENCOUNTER — Encounter: Payer: Self-pay | Admitting: Family Medicine

## 2021-04-10 VITALS — BP 160/90 | HR 81 | Temp 97.3°F | Ht 68.0 in | Wt 120.1 lb

## 2021-04-10 DIAGNOSIS — E785 Hyperlipidemia, unspecified: Secondary | ICD-10-CM

## 2021-04-10 DIAGNOSIS — M810 Age-related osteoporosis without current pathological fracture: Secondary | ICD-10-CM

## 2021-04-10 DIAGNOSIS — Z23 Encounter for immunization: Secondary | ICD-10-CM

## 2021-04-10 DIAGNOSIS — Z0000 Encounter for general adult medical examination without abnormal findings: Secondary | ICD-10-CM

## 2021-04-10 DIAGNOSIS — E222 Syndrome of inappropriate secretion of antidiuretic hormone: Secondary | ICD-10-CM | POA: Diagnosis not present

## 2021-04-10 DIAGNOSIS — R7303 Prediabetes: Secondary | ICD-10-CM

## 2021-04-10 DIAGNOSIS — I10 Essential (primary) hypertension: Secondary | ICD-10-CM | POA: Diagnosis not present

## 2021-04-10 DIAGNOSIS — R569 Unspecified convulsions: Secondary | ICD-10-CM | POA: Diagnosis not present

## 2021-04-10 DIAGNOSIS — E213 Hyperparathyroidism, unspecified: Secondary | ICD-10-CM

## 2021-04-10 LAB — LIPID PANEL
Cholesterol: 239 mg/dL — ABNORMAL HIGH (ref 0–200)
HDL: 121.8 mg/dL (ref >39.00)
LDL Cholesterol: 105 mg/dL — ABNORMAL HIGH (ref 0–99)
NonHDL: 117.36
Total CHOL/HDL Ratio: 2
Triglycerides: 61 mg/dL (ref 0.0–149.0)
VLDL: 12.2 mg/dL (ref 0.0–40.0)

## 2021-04-10 LAB — HEMOGLOBIN A1C: Hgb A1c MFr Bld: 5.9 % (ref 4.6–6.5)

## 2021-04-10 NOTE — Addendum Note (Signed)
Addended by: Carter Kitten on: 04/10/2021 12:14 PM   Modules accepted: Orders

## 2021-04-10 NOTE — Progress Notes (Signed)
Patient ID: Candice Gray, female    DOB: [birth year], 65 y.o.   MRN: [id number]  This visit was conducted in person.  BP (!) 160/90   Pulse 81   Temp (!) 97.3 F (36.3 C) (Temporal)   Ht 5\' 8"  (1.727 m)   Wt 120 lb 2 oz (54.5 kg)   SpO2 99%   BMI 18.26 kg/m    CC:  Chief Complaint  Patient presents with   Welcome to Medicare    Subjective:   HPI: Candice Gray is a 65 y.o. female presenting on 04/10/2021 for Welcome to Medicare  The patient presents for welcome to  medicare wellness, complete physical and review of chronic health problems. He/She also has the following acute concerns today:    I have personally reviewed the Medicare Annual Wellness questionnaire and have noted 1. The patient's medical and social history 2. Their use of alcohol, tobacco or illicit drugs 3. Their current medications and supplements 4. The patient's functional ability including ADL's, fall risks, home safety risks and hearing or visual             impairment. 5. Diet and physical activities 6. Evidence for depression or mood disorders 7.         Updated provider list Cognitive evaluation was performed and recorded on pt medicare questionnaire form. The patients weight, height, BMI and visual acuity have been recorded in the chart   I have made referrals, counseling and provided education to the patient based review of the above and I have provided the pt with a written personalized care plan for preventive services.    Documentation of this information was scanned into the electronic record under the media tab.  Advance directives and end of life planning reviewed in detail with patient and documented in EMR. Patient given handout on advance care directives if needed. HCPOA and living will updated if needed.  Fall Risk 09/30/2017 04/07/2018 04/11/2018 04/10/2021  Falls in the past year? 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  Hypertension:   Elevated in office today.Marland Kitchen amlodipine  metoprolol BP Readings from Last 3 Encounters:  04/10/21 (!) 160/90  10/29/20 (!) 170/89  04/04/20 130/80  Using medication without problems or lightheadedness: none Chest pain with exertion: none Edema:none Short of breath: none Average home BPs:130/70s Other issues:  Hyperparathyroid,SIADH, low mg followed by ENDO Dr. Buddy Duty.  Seizures:  No seizures. Stable on lamictal for 4 years. Neuro  Dr. Krista Blue  Relevant past medical, surgical, family and social history reviewed and updated as indicated. Interim medical history since our last visit reviewed. Allergies and medications reviewed and updated. Outpatient Medications Prior to Visit  Medication Sig Dispense Refill   amLODipine (NORVASC) 2.5 MG tablet TAKE 1 TABLET BY MOUTH TWICE DAILY 180 tablet 0   Biotin 1 MG CAPS Take 1 tablet by mouth daily.     Calcium-Vitamin D (CALTRATE 600 PLUS-VIT D PO) Take 1 tablet by mouth daily.  Cholecalciferol (VITAMIN D-3 PO) Take 1 capsule by mouth daily.     hydrocortisone 2.5 % cream Apply 1 application topically as needed.      lamoTRIgine (LAMICTAL) 100 MG tablet Take 1 tablet (100 mg total) by mouth 2 (two) times daily. 180 tablet 4   latanoprost (XALATAN) 0.005 % ophthalmic solution Place 1 drop into both eyes at bedtime.     metoprolol tartrate (LOPRESSOR) 25 MG tablet TAKE 1/2 TABLET BY MOUTH TWICE DAILY 90 tablet 3   vitamin C (ASCORBIC ACID) 500 MG tablet Take 500 mg by mouth daily.     zoledronic acid (RECLAST) 5 MG/100ML SOLN injection Inject 5 mg into the vein once.     No  facility-administered medications prior to visit.     Per HPI unless specifically indicated in ROS section below Review of Systems  Constitutional:  Negative for fatigue and fever.  HENT:  Negative for congestion.   Eyes:  Negative for pain.  Respiratory:  Negative for cough and shortness of breath.   Cardiovascular:  Negative for chest pain, palpitations and leg swelling.  Gastrointestinal:  Negative for abdominal pain.  Genitourinary:  Negative for dysuria and vaginal bleeding.  Musculoskeletal:  Negative for back pain.  Neurological:  Negative for syncope, light-headedness and headaches.  Psychiatric/Behavioral:  Negative for dysphoric mood.   Objective:  BP (!) 160/90   Pulse 81   Temp (!) 97.3 F (36.3 C) (Temporal)   Ht 5\' 8"  (1.727 m)   Wt 120 lb 2 oz (54.5 kg)   SpO2 99%   BMI 18.26 kg/m   Wt Readings from Last 3 Encounters:  04/10/21 120 lb 2 oz (54.5 kg)  10/29/20 120 lb (54.4 kg)  04/04/20 123 lb (55.8 kg)      Physical Exam    Results for orders placed or performed in visit on 03/05/21  HM MAMMOGRAPHY  Result Value Ref Range   HM Mammogram 0-4 Bi-Rad 0-4 Bi-Rad, Self Reported Normal    This visit occurred during the SARS-CoV-2 public health emergency.  Safety protocols were in place, including screening questions prior to the visit, additional usage of staff PPE, and extensive cleaning of exam room while observing appropriate contact time as indicated for disinfecting solutions.   COVID 19 screen:  No recent travel or known exposure to COVID19 The patient denies respiratory symptoms of COVID 19 at this time. The importance of social distancing was discussed today.   Assessment and Plan The patient's preventative maintenance and recommended screening tests for an annual wellness exam were reviewed in full today. Brought up to date unless services declined.  Counselled on the importance of diet, exercise, and its role in overall health and mortality. The  patient's FH and SH was reviewed, including their home life, tobacco status, and drug and alcohol status.   Last DEXA  2015 worsening osteopenia, stopped boniva. 2017 osteoporosis ENDO treating.. Started Reclast 2019.Marland Kitchen  DXA 05/2019 T-2.9 .. due for repeat 2023 Last colon: 2011 was bengin polyps.. No repeat screen in this way given small caliber colon. Negative Cologuard in 09/2019  Mammo nml 10/2022nml Pap q 5 years, last nml in 2021  neg HPV. Counseled against marijuana use.   Nonsmoker Hep C: neg Vaccines: refused COVID,  Uptodate with td and flu, consider shingrix.Marland Kitchen due for prevnar 20  Wt Readings from Last 3 Encounters:  04/10/21 120 lb 2 oz (54.5 kg)  10/29/20 120 lb (54.4 kg)  04/04/20 123 lb (55.8 kg)   Problem List  Items Addressed This Visit     Hyperlipidemia   Hyperparathyroidism ([place])     Followed by ENDO.      HYPERTENSION, BENIGN ESSENTIAL, LABILE     Well controlled at home per pt.. on amlodipine and metoprolol.      Osteoporosis     On Reclast per ENDO.      Prediabetes   Seizures ([facility])     Stable on Lamictal, followed Neuro Dr. Carolan Shiver      SIADH (syndrome of inappropriate ADH production) ([place])    Followed by ENDO      Other Visit Diagnoses     Welcome to Medicare preventive visit    -  Primary            Eliezer Lofts, MD

## 2021-04-10 NOTE — Assessment & Plan Note (Signed)
Well controlled at home per pt.. on amlodipine and metoprolol.

## 2021-04-10 NOTE — Assessment & Plan Note (Signed)
Stable on Lamictal, followed Neuro Dr. Carolan Shiver

## 2021-04-10 NOTE — Patient Instructions (Addendum)
Please stop at the lab to have labs drawn.  Use  protein shake in addition to meals, Increase protein and carbs in diet.

## 2021-04-10 NOTE — Assessment & Plan Note (Signed)
Followed by ENDO 

## 2021-04-10 NOTE — Assessment & Plan Note (Signed)
On Reclast per ENDO.

## 2021-05-04 ENCOUNTER — Other Ambulatory Visit: Payer: Self-pay | Admitting: Family Medicine

## 2021-06-13 ENCOUNTER — Other Ambulatory Visit: Payer: Self-pay | Admitting: Family Medicine

## 2021-10-29 ENCOUNTER — Ambulatory Visit (INDEPENDENT_AMBULATORY_CARE_PROVIDER_SITE_OTHER): Payer: Medicare Other | Admitting: Neurology

## 2021-10-29 VITALS — BP 175/82 | HR 74 | Ht 68.0 in | Wt 125.0 lb

## 2021-10-29 DIAGNOSIS — R569 Unspecified convulsions: Secondary | ICD-10-CM | POA: Diagnosis not present

## 2021-10-29 MED ORDER — LAMOTRIGINE 100 MG PO TABS: 100.0000 mg | ORAL_TABLET | Freq: Two times a day (BID) | ORAL | 4 refills | Status: DC

## 2021-10-29 NOTE — Progress Notes (Signed)
PATIENT: Candice Gray DOB: 1956-02-27  REASON FOR VISIT: follow up HISTORY FROM: patient PRIMARY NEUROLOGIST: Dr. Krista Blue   HISTORY OF PRESENT ILLNESS: Candice Gray is a 66 years old right-handed female, seen in refer by her primary care doctor  Jinny Sanders for evaluation of seizure on March 25 2016, initial evaluation was on April 01 2016.   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   She was put on water restriction 64 ounces daily ever since, is under close supervision of endocrinologist, she reported frequent thirsty recently, has liberated her water intake some,   On March 25 2016 around 10:00 in the morning, she noticed mild dizziness, sat down in the chair, lost consciousness, woke up on ambulance confused, she has a witnessed tonic-clonic seizure activity, her body becoming rigid, mouth open, eyes rolled back, lasting for a few minutes, followed by post event confusion.    I personally reviewed MRI of the brain on March 25 2016, mild generalized atrophy no acute abnormality    I reviewed her laboratory evaluation in November, glucose was 123, sodium was 129, potassium was 3.2, chloride was 90, glucose was 118, creatinine 0.78, WBC was mildly elevated 13, hemoglobin was 15 point 1, magnesium 1.6, A1c was 5.5, normal TSH, BNP was 112, magnesium 1.8, UDS was positive for marijuana, alcohol was less than 5, negative hepatitis C,    EEG showed: posterior dominant rhythm of 9 Hz reactive eye opening and closure., there are epileptiform discharges per description with maximum at F7 and T3. These do not evolve into electrographic seizures.  No sleep was recorded.   She was treated with Keppra 500 mg 3 times a day, she complains of being anxious about her health, get irritated easily, she does smoke marijuana  regularly, now she has stopped smoking.   UPDATE Sep 29 2016:YY She is doing well, able to tolerate lamotrigine 100 mg twice a day much better, no recurrent seizure,  UPDATE October 30 2019: She is overall doing well, there was no recurrent seizure, tolerating lamotrigine 100 mg twice a day, still under close supervision of her primary care for hyponatremia, under water restriction of 64 ounce daily, she still use marijuana every day  Most recent laboratory evaluation November 2020, A1c 5.7, CMP showed mildly low sodium 132, chloride 93, calcium 10.8, lipid panel elevated cholesterol 231, LDL 100  Update October 29, 2020 SS: Reports last Friday night, 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 Spell last year after mother died, pure exhaustion from planning service, babbling talking to friend, got something to eat, felt much better. Remains on Lamictal 100 mg twice daily. BP was higher last week than normal, can sometimes indicate sodium level was low. Sees endocrinology at [facility]. Is on fluid restriction, 64 oz is daily limit, generous with salt. Sodium 131 in Nov 2021. Only 1 seizure ever, not really sure what to look for with seizures.  Update 10/29/21 SS: Things are going well, no seizures, remains on Lamictal 100 mg twice daily,  Labs 03/04/21 sodium 132, chloride 94. Sees endocrinology annually. Limits her water intake. Is pleased she has gained 5 lbs.   REVIEW OF SYSTEMS: Out of a complete 14 system review of  symptoms, the patient complains only of the following symptoms, and all other reviewed systems are negative.  See HPI  ALLERGIES: Allergies  Allergen Reactions   Azelastine Hcl     REACTION: increased bp   Depo-Medrol [Methylprednisolone] Itching   Miralax [Polyethylene Glycol] Itching   Red Dye Other (See Comments)    Contact Dermatitis   Zyban [Bupropion] Swelling    HOME  MEDICATIONS: Outpatient Medications Prior to Visit  Medication Sig Dispense Refill   amLODipine (NORVASC) 2.5 MG tablet TAKE 1 TABLET BY MOUTH TWICE DAILY 180 tablet 3   Biotin 1 MG CAPS Take 1 tablet by mouth daily.     Calcium-Vitamin D (CALTRATE 600 PLUS-VIT D PO) Take 1 tablet by mouth daily.     Cholecalciferol (VITAMIN D-3 PO) Take 1 capsule by mouth daily.     hydrocortisone 2.5 % cream Apply 1 application topically as needed.      latanoprost (XALATAN) 0.005 % ophthalmic solution Place 1 drop into both eyes at bedtime.     metoprolol tartrate (LOPRESSOR) 25 MG tablet TAKE 1/2 TABLET BY MOUTH TWICE DAILY 90 tablet 3   vitamin C (ASCORBIC ACID) 500 MG tablet Take 500 mg by mouth daily.     zoledronic acid (RECLAST) 5 MG/100ML SOLN injection Inject 5 mg into the vein once.     lamoTRIgine (LAMICTAL) 100 MG tablet Take 1 tablet (100 mg total) by mouth 2 (two) times daily. 180 tablet 4   No facility-administered medications prior to visit.    PAST MEDICAL HISTORY: Past Medical History:  Diagnosis Date   Anxiety    Dermatitis, seborrheic 2018   Deviated nasal septum    Diverticulitis    Diverticulosis    HTN (hypertension)    Hyperlipemia    Hyperplastic colon polyp    Hyponatremia    Hyponatremia    MVP (mitral valve prolapse)    Raynaud phenomenon    Seizures (HCC)    SIADH (syndrome of inappropriate ADH production) ([place])     PAST SURGICAL HISTORY: Past Surgical History:  Procedure Laterality Date   ABDOMINAL HYSTERECTOMY     CATARACT EXTRACTION, BILATERAL  2019   CRYOTHERAPY     for cervical dysplasia   NM MYOCAR PERF WALL MOTION  11/2007   bruce myoview; perfusion defect in anterior myocardium (breast attenuation); post-stress EF 77%; normal, low risk study    TRANSTHORACIC ECHOCARDIOGRAM  11/2007   EF=>55%; mild MR; trace TR   TUBAL LIGATION      FAMILY HISTORY: Family History  Problem Relation Age of Onset   Hyperlipidemia Father    Hypertension Father     Diabetes Father    Atrial fibrillation Father    Diabetes Paternal Grandmother    Cancer Paternal Grandfather        brain tumor   Hyperlipidemia Other    Hypertension Other    Coronary artery disease Maternal Grandmother    Diabetes Maternal Grandmother    Hyperlipidemia Brother    Transient ischemic attack Mother    Dementia Mother    Colon cancer Neg Hx     SOCIAL HISTORY: Social History   Socioeconomic History   Marital status: Married    Spouse name: Candice Gray   Number of children: 2   Years of education: [employer] education level: Not on file  Occupational History   Occupation: retired    Fish farm manager: UNEMPLOYED  Tobacco Use   Smoking status: Former    Types: Cigarettes    Quit  date: 06/05/1998    Years since quitting: 23.4   Smokeless tobacco: Never  Substance and Sexual Activity   Alcohol use: Yes    Alcohol/week: 4.0 standard drinks of alcohol    Types: 4 Cans of beer per week    Comment: daily   Drug use: Yes    Types: Marijuana    Comment: 04/11/18 yes marijuana   Sexual activity: Not on file  Other Topics Concern   Not on file  Social History Narrative   Lives at husband and 2 sons   Right-handed   Regular exercise--yes   Caffeine: daily coffee   Diet: fruits and veggies   Social Determinants of Health   Financial Resource Strain: Not on file  Food Insecurity: Not on file  Transportation Needs: Not on file  Physical Activity: Not on file  Stress: Not on file  Social Connections: Not on file  Intimate Partner Violence: Not on file   PHYSICAL EXAM  Vitals:   10/29/21 0930  BP: (!) 175/82  Pulse: 74  Weight: 125 lb (56.7 kg)  Height: '5\' 8"'$  (1.727 m)    Body mass index is 19.01 kg/m.  Generalized: Well developed, in no acute distress   Neurological examination  Mentation: Alert oriented to time, place, history taking. Follows all commands speech and language fluent Cranial nerve II-XII: Pupils were equal round reactive to light.  Extraocular movements were full, visual field were full on confrontational test. Facial sensation and strength were normal. Head turning and shoulder shrug were normal and symmetric. Motor: 5/5 strength bilaterally  Sensory: Sensory testing is intact to light touch and vibratory sensation Coordination: Finger nose finger and heel to shin was normal bilaterally  Gait and station: Gait is normal.  Reflexes: Deep tendon reflexes are symmetric and normal bilaterally.   DIAGNOSTIC DATA (LABS, IMAGING, TESTING) - I reviewed patient records, labs, notes, testing and imaging myself where available.  Lab Results  Component Value Date   WBC 10.0 10/29/2020   HGB 14.1 10/29/2020   HCT 40.8 10/29/2020   MCV 95 10/29/2020   PLT 243 10/29/2020      Component Value Date/Time   NA 132 (A) 03/04/2021 0000   K 4.4 03/04/2021 0000   CL 94 (A) 03/04/2021 0000   CO2 30 (A) 03/04/2021 0000   GLUCOSE 111 (H) 10/29/2020 1205   GLUCOSE 131 (H) 03/28/2020 0812   BUN 14 03/04/2021 0000   CREATININE 0.7 03/04/2021 0000   CREATININE 0.75 10/29/2020 1205   CALCIUM 10.2 03/04/2021 0000   PROT 7.6 10/29/2020 1205   ALBUMIN 4.6 03/04/2021 0000   ALBUMIN 4.8 10/29/2020 1205   AST 20 03/04/2021 0000   ALT 15 03/04/2021 0000   ALKPHOS 45 03/04/2021 0000   BILITOT 0.3 10/29/2020 1205   GFRNONAA 89 03/04/2021 0000   GFRAA 107 10/31/2018 1040   Lab Results  Component Value Date   CHOL 239 (H) 04/10/2021   HDL 121.80 04/10/2021   LDLCALC 105 (H) 04/10/2021   LDLDIRECT 74.6 12/31/2011   TRIG 61.0 04/10/2021   CHOLHDL 2 04/10/2021   Lab Results  Component Value Date   HGBA1C 5.9 04/10/2021   Lab Results  Component Value Date   VITAMINB12 390 12/20/2012   Lab Results  Component Value Date   TSH 1.824 03/25/2016    ASSESSMENT AND PLAN 66 y.o. year old female  1. Seizure on March 25, 2016 -Seizure episode occurred in setting of hyponatremia in 2017, sodium level was 129, continues to follow  with endocrinology, on fluid restriction -No recent seizures, continue Lamictal 100 mg BID -Check labs today  -MRI of the brain showed generalized atrophy, no acute abnormality -EEG showed focal slowing of left to mid temporal lobe -Follow-up in 1 year or sooner if needed   Butler Denmark, Laqueta Jean, [facility] [address] [place], [place] [address] [phone]

## 2021-10-30 LAB — COMPREHENSIVE METABOLIC PANEL
ALT: 15 IU/L (ref 0–32)
AST: 22 IU/L (ref 0–40)
Albumin/Globulin Ratio: 1.8 (ref 1.2–2.2)
Albumin: 4.9 g/dL — ABNORMAL HIGH (ref 3.8–4.8)
Alkaline Phosphatase: 59 IU/L (ref 44–121)
BUN/Creatinine Ratio: 16 (ref 12–28)
BUN: 11 mg/dL (ref 8–27)
Bilirubin Total: 0.3 mg/dL (ref 0.0–1.2)
CO2: 24 mmol/L (ref 20–29)
Calcium: 10.7 mg/dL — ABNORMAL HIGH (ref 8.7–10.3)
Chloride: 91 mmol/L — ABNORMAL LOW (ref 96–106)
Creatinine, Ser: 0.7 mg/dL (ref 0.57–1.00)
Globulin, Total: 2.7 g/dL (ref 1.5–4.5)
Glucose: 104 mg/dL — ABNORMAL HIGH (ref 70–99)
Potassium: 4.4 mmol/L (ref 3.5–5.2)
Sodium: 132 mmol/L — ABNORMAL LOW (ref 134–144)
Total Protein: 7.6 g/dL (ref 6.0–8.5)
eGFR: 95 mL/min/{1.73_m2} (ref >59)

## 2021-10-30 LAB — CBC WITH DIFFERENTIAL/PLATELET
Basophils Absolute: 0 10*3/uL (ref 0.0–0.2)
Basos: 0 %
EOS (ABSOLUTE): 0.1 10*3/uL (ref 0.0–0.4)
Eos: 1 %
Hematocrit: 40.9 % (ref 34.0–46.6)
Hemoglobin: 13.9 g/dL (ref 11.1–15.9)
Immature Grans (Abs): 0.1 10*3/uL (ref 0.0–0.1)
Immature Granulocytes: 1 %
Lymphocytes Absolute: 2.6 10*3/uL (ref 0.7–3.1)
Lymphs: 28 %
MCH: 32.3 pg (ref 26.6–33.0)
MCHC: 34 g/dL (ref 31.5–35.7)
MCV: 95 fL (ref 79–97)
Monocytes Absolute: 1 10*3/uL — ABNORMAL HIGH (ref 0.1–0.9)
Monocytes: 11 %
Neutrophils Absolute: 5.6 10*3/uL (ref 1.4–7.0)
Neutrophils: 59 %
Platelets: 300 10*3/uL (ref 150–450)
RBC: 4.31 x10E6/uL (ref 3.77–5.28)
RDW: 12 % (ref 11.7–15.4)
WBC: 9.4 10*3/uL (ref 3.4–10.8)

## 2021-10-30 LAB — LAMOTRIGINE LEVEL: Lamotrigine Lvl: 6.6 ug/mL (ref 2.0–20.0)

## 2022-03-15 ENCOUNTER — Other Ambulatory Visit: Payer: Self-pay | Admitting: Internal Medicine

## 2022-03-15 DIAGNOSIS — M810 Age-related osteoporosis without current pathological fracture: Secondary | ICD-10-CM

## 2022-03-16 LAB — HM MAMMOGRAPHY

## 2022-03-18 ENCOUNTER — Encounter: Payer: Self-pay | Admitting: Family Medicine

## 2022-04-10 ENCOUNTER — Telehealth: Payer: Self-pay | Admitting: Family Medicine

## 2022-04-10 DIAGNOSIS — E785 Hyperlipidemia, unspecified: Secondary | ICD-10-CM

## 2022-04-10 DIAGNOSIS — R7303 Prediabetes: Secondary | ICD-10-CM

## 2022-04-10 NOTE — Telephone Encounter (Signed)
-----   Message from Velna Hatchet, RT sent at 04/05/2022  2:31 PM EST ----- Regarding: Tue 11/21 labs Patient is scheduled for cpx, please order future labs.  Thanks, Anda Kraft

## 2022-04-13 ENCOUNTER — Other Ambulatory Visit (INDEPENDENT_AMBULATORY_CARE_PROVIDER_SITE_OTHER): Payer: Medicare Other

## 2022-04-13 DIAGNOSIS — E785 Hyperlipidemia, unspecified: Secondary | ICD-10-CM | POA: Diagnosis not present

## 2022-04-13 DIAGNOSIS — R7303 Prediabetes: Secondary | ICD-10-CM

## 2022-04-13 LAB — COMPREHENSIVE METABOLIC PANEL
ALT: 13 U/L (ref 0–35)
AST: 18 U/L (ref 0–37)
Albumin: 4.4 g/dL (ref 3.5–5.2)
Alkaline Phosphatase: 61 U/L (ref 39–117)
BUN: 11 mg/dL (ref 6–23)
CO2: 32 mEq/L (ref 19–32)
Calcium: 10 mg/dL (ref 8.4–10.5)
Chloride: 90 mEq/L — ABNORMAL LOW (ref 96–112)
Creatinine, Ser: 0.68 mg/dL (ref 0.40–1.20)
GFR: 90.67 mL/min (ref >60.00)
Glucose, Bld: 108 mg/dL — ABNORMAL HIGH (ref 70–99)
Potassium: 4.3 mEq/L (ref 3.5–5.1)
Sodium: 128 mEq/L — ABNORMAL LOW (ref 135–145)
Total Bilirubin: 0.5 mg/dL (ref 0.2–1.2)
Total Protein: 7 g/dL (ref 6.0–8.3)

## 2022-04-13 LAB — LIPID PANEL
Cholesterol: 240 mg/dL — ABNORMAL HIGH (ref 0–200)
HDL: 120.3 mg/dL (ref >39.00)
LDL Cholesterol: 108 mg/dL — ABNORMAL HIGH (ref 0–99)
NonHDL: 119.21
Total CHOL/HDL Ratio: 2
Triglycerides: 58 mg/dL (ref 0.0–149.0)
VLDL: 11.6 mg/dL (ref 0.0–40.0)

## 2022-04-13 LAB — HEMOGLOBIN A1C: Hgb A1c MFr Bld: 6.2 % (ref 4.6–6.5)

## 2022-04-19 ENCOUNTER — Ambulatory Visit: Payer: Medicare Other

## 2022-04-19 ENCOUNTER — Ambulatory Visit (INDEPENDENT_AMBULATORY_CARE_PROVIDER_SITE_OTHER): Payer: Medicare Other

## 2022-04-19 VITALS — Ht 68.0 in | Wt 122.0 lb

## 2022-04-19 DIAGNOSIS — Z0000 Encounter for general adult medical examination without abnormal findings: Secondary | ICD-10-CM | POA: Diagnosis not present

## 2022-04-19 NOTE — Patient Instructions (Addendum)
[name] , Thank you for taking time to come for your Medicare Wellness Visit. I appreciate your ongoing commitment to your health goals. Please review the following plan we discussed and let me know if I can assist you in the future.   These are the goals we discussed:  Goals       No current goals (pt-stated)        This is a list of the screening recommended for you and due dates:  Health Maintenance  Topic Date Due   DEXA scan (bone density measurement)  06/14/2021   Zoster (Shingles) Vaccine (1 of 2) 07/20/2022*   Flu Shot  08/22/2022*   Cologuard (Stool DNA test)  10/18/2022   Mammogram  03/17/2023   Medicare Annual Wellness Visit  04/20/2023   Pneumonia Vaccine  Completed   Hepatitis C Screening: USPSTF Recommendation to screen - Ages 18-79 yo.  Completed   HPV Vaccine  Aged Out   Colon Cancer Screening  Discontinued   COVID-19 Vaccine  Discontinued  *Topic was postponed. The date shown is not the original due date.    Advanced directives: Please bring a copy of your health care power of attorney and living will to the office to be added to your chart at your convenience.   Conditions/risks identified: None  Next appointment: Follow up in one year for your annual wellness visit     Preventive Care 65 Years and Older, Female Preventive care refers to lifestyle choices and visits with your health care provider that can promote health and wellness. What does preventive care include? A yearly physical exam. This is also called an annual well check. Dental exams once or twice a year. Routine eye exams. Ask your health care provider how often you should have your eyes checked. Personal lifestyle choices, including: Daily care of your teeth and gums. Regular physical activity. Eating a healthy diet. Avoiding tobacco and drug use. Limiting alcohol use. Practicing safe sex. Taking low-dose aspirin every day. Taking vitamin and mineral supplements as recommended by your  health care provider. What happens during an annual well check? The services and screenings done by your health care provider during your annual well check will depend on your age, overall health, lifestyle risk factors, and family history of disease. Counseling  Your health care provider may ask you questions about your: Alcohol use. Tobacco use. Drug use. Emotional well-being. Home and relationship well-being. Sexual activity. Eating habits. History of falls. Memory and ability to understand (cognition). Work and work Statistician. Reproductive health. Screening  You may have the following tests or measurements: Height, weight, and BMI. Blood pressure. Lipid and cholesterol levels. These may be checked every 5 years, or more frequently if you are over 69 years old. Skin check. Lung cancer screening. You may have this screening every year starting at age 43 if you have a 30-pack-year history of smoking and currently smoke or have quit within the past 15 years. Fecal occult blood test (FOBT) of the stool. You may have this test every year starting at age 39. Flexible sigmoidoscopy or colonoscopy. You may have a sigmoidoscopy every 5 years or a colonoscopy every 10 years starting at age 49. Hepatitis C blood test. Hepatitis B blood test. Sexually transmitted disease (STD) testing. Diabetes screening. This is done by checking your blood sugar (glucose) after you have not eaten for a while (fasting). You may have this done every 1-3 years. Bone density scan. This is done to screen for osteoporosis. You  may have this done starting at age 109. Mammogram. This may be done every 1-2 years. Talk to your health care provider about how often you should have regular mammograms. Talk with your health care provider about your test results, treatment options, and if necessary, the need for more tests. Vaccines  Your health care provider may recommend certain vaccines, such as: Influenza vaccine.  This is recommended every year. Tetanus, diphtheria, and acellular pertussis (Tdap, Td) vaccine. You may need a Td booster every 10 years. Zoster vaccine. You may need this after age 54. Pneumococcal 13-valent conjugate (PCV13) vaccine. One dose is recommended after age 44. Pneumococcal polysaccharide (PPSV23) vaccine. One dose is recommended after age 90. Talk to your health care provider about which screenings and vaccines you need and how often you need them. This information is not intended to replace advice given to you by your health care provider. Make sure you discuss any questions you have with your health care provider. Document Released: 06/06/2015 Document Revised: 01/28/2016 Document Reviewed: 03/11/2015 Elsevier Interactive Patient Education  2017 [place] Prevention in the Home Falls can cause injuries. They can happen to people of all ages. There are many things you can do to make your home safe and to help prevent falls. What can I do on the outside of my home? Regularly fix the edges of walkways and driveways and fix any cracks. Remove anything that might make you trip as you walk through a door, such as a raised step or threshold. Trim any bushes or trees on the path to your home. Use bright outdoor lighting. Clear any walking paths of anything that might make someone trip, such as rocks or tools. Regularly check to see if handrails are loose or broken. Make sure that both sides of any steps have handrails. Any raised decks and porches should have guardrails on the edges. Have any leaves, snow, or ice cleared regularly. Use sand or salt on walking paths during winter. Clean up any spills in your garage right away. This includes oil or grease spills. What can I do in the bathroom? Use night lights. Install grab bars by the toilet and in the tub and shower. Do not use towel bars as grab bars. Use non-skid mats or decals in the tub or shower. If you need to sit  down in the shower, use a plastic, non-slip stool. Keep the floor dry. Clean up any water that spills on the floor as soon as it happens. Remove soap buildup in the tub or shower regularly. Attach bath mats securely with double-sided non-slip rug tape. Do not have throw rugs and other things on the floor that can make you trip. What can I do in the bedroom? Use night lights. Make sure that you have a light by your bed that is easy to reach. Do not use any sheets or blankets that are too big for your bed. They should not hang down onto the floor. Have a firm chair that has side arms. You can use this for support while you get dressed. Do not have throw rugs and other things on the floor that can make you trip. What can I do in the kitchen? Clean up any spills right away. Avoid walking on wet floors. Keep items that you use a lot in easy-to-reach places. If you need to reach something above you, use a strong step stool that has a grab bar. Keep electrical cords out of the way. Do not use floor  polish or wax that makes floors slippery. If you must use wax, use non-skid floor wax. Do not have throw rugs and other things on the floor that can make you trip. What can I do with my stairs? Do not leave any items on the stairs. Make sure that there are handrails on both sides of the stairs and use them. Fix handrails that are broken or loose. Make sure that handrails are as long as the stairways. Check any carpeting to make sure that it is firmly attached to the stairs. Fix any carpet that is loose or worn. Avoid having throw rugs at the top or bottom of the stairs. If you do have throw rugs, attach them to the floor with carpet tape. Make sure that you have a light switch at the top of the stairs and the bottom of the stairs. If you do not have them, ask someone to add them for you. What else can I do to help prevent falls? Wear shoes that: Do not have high heels. Have rubber bottoms. Are  comfortable and fit you well. Are closed at the toe. Do not wear sandals. If you use a stepladder: Make sure that it is fully opened. Do not climb a closed stepladder. Make sure that both sides of the stepladder are locked into place. Ask someone to hold it for you, if possible. Clearly mark and make sure that you can see: Any grab bars or handrails. First and last steps. Where the edge of each step is. Use tools that help you move around (mobility aids) if they are needed. These include: Canes. Walkers. Scooters. Crutches. Turn on the lights when you go into a dark area. Replace any light bulbs as soon as they burn out. Set up your furniture so you have a clear path. Avoid moving your furniture around. If any of your floors are uneven, fix them. If there are any pets around you, be aware of where they are. Review your medicines with your doctor. Some medicines can make you feel dizzy. This can increase your chance of falling. Ask your doctor what other things that you can do to help prevent falls. This information is not intended to replace advice given to you by your health care provider. Make sure you discuss any questions you have with your health care provider. Document Released: 03/06/2009 Document Revised: 10/16/2015 Document Reviewed: 06/14/2014 Elsevier Interactive Patient Education  2017 [employer].

## 2022-04-19 NOTE — Progress Notes (Signed)
Subjective:   Candice Gray is a 66 y.o. female who presents for Medicare Annual (Subsequent) preventive examination.  Review of Systems    Virtual Visit via Telephone Note  I connected with  Candice Georgina Snell on 04/19/22 at  8:45 AM EST by telephone and verified that I am speaking with the correct person using two identifiers.  Location: Patient: Home Provider: Office Persons participating in the virtual visit: patient/Nurse Health Advisor   I discussed the limitations, risks, security and privacy concerns of performing an evaluation and management service by telephone and the availability of in person appointments. The patient expressed understanding and agreed to proceed.  Interactive audio and video telecommunications were attempted between this nurse and patient, however failed, due to patient having technical difficulties OR patient did not have access to video capability.  We continued and completed visit with audio only.  Some vital signs may be absent or patient reported.   Criselda Peaches, LPN  Cardiac Risk Factors include: advanced age (>29mn, >>78women);hypertension     Objective:    Today's Vitals   04/19/22 0841  Weight: 122 lb (55.3 kg)  Height: '5\' 8"'$  (1.727 m)   Body mass index is 18.55 kg/m.     04/19/2022    8:49 AM 03/25/2016   11:06 AM 08/11/2013    4:11 PM  Advanced Directives  Does Patient Have a Medical Advance Directive? Yes No Patient does not have advance directive  Type of AScientist, forensicPower of [place] will    Copy of HMaxin Chart? No - copy requested    Would patient like information on creating a medical advance directive?  No - patient declined information   Pre-existing out of facility DNR order (yellow form or pink MOST form)   No    Current Medications (verified) Outpatient Encounter Medications as of 04/19/2022  Medication Sig   amLODipine (NORVASC) 2.5 MG tablet TAKE 1  TABLET BY MOUTH TWICE DAILY   Biotin 1 MG CAPS Take 1 tablet by mouth daily.   Calcium-Vitamin D (CALTRATE 600 PLUS-VIT D PO) Take 1 tablet by mouth daily.   Cholecalciferol (VITAMIN D-3 PO) Take 1 capsule by mouth daily.   hydrocortisone 2.5 % cream Apply 1 application topically as needed.    lamoTRIgine (LAMICTAL) 100 MG tablet Take 1 tablet (100 mg total) by mouth 2 (two) times daily.   latanoprost (XALATAN) 0.005 % ophthalmic solution Place 1 drop into both eyes at bedtime.   metoprolol tartrate (LOPRESSOR) 25 MG tablet TAKE 1/2 TABLET BY MOUTH TWICE DAILY   vitamin C (ASCORBIC ACID) 500 MG tablet Take 500 mg by mouth daily.   zoledronic acid (RECLAST) 5 MG/100ML SOLN injection Inject 5 mg into the vein once.   No facility-administered encounter medications on file as of 04/19/2022.    Allergies (verified) Azelastine hcl, Depo-medrol [methylprednisolone], Miralax [polyethylene glycol], Red dye, and Zyban [bupropion]   History: Past Medical History:  Diagnosis Date   Anxiety    Dermatitis, seborrheic 2018   Deviated nasal septum    Diverticulitis    Diverticulosis    HTN (hypertension)    Hyperlipemia    Hyperplastic colon polyp    Hyponatremia    Hyponatremia    MVP (mitral valve prolapse)    Raynaud phenomenon    Seizures (HCC)    SIADH (syndrome of inappropriate ADH production) ([place]    Past Surgical History:  Procedure Laterality Date   ABDOMINAL HYSTERECTOMY  CATARACT EXTRACTION, BILATERAL  2019   CRYOTHERAPY     for cervical dysplasia   NM MYOCAR PERF WALL MOTION  11/2007   bruce myoview; perfusion defect in anterior myocardium (breast attenuation); post-stress EF 77%; normal, low risk study    TRANSTHORACIC ECHOCARDIOGRAM  11/2007   EF=>55%; mild MR; trace TR   TUBAL LIGATION     Family History  Problem Relation Age of Onset   Hyperlipidemia Father    Hypertension Father    Diabetes Father    Atrial fibrillation Father    Diabetes Paternal Grandmother     Cancer Paternal Grandfather        brain tumor   Hyperlipidemia Other    Hypertension Other    Coronary artery disease Maternal Grandmother    Diabetes Maternal Grandmother    Hyperlipidemia Brother    Transient ischemic attack Mother    Dementia Mother    Colon cancer Neg Hx    Social History   Socioeconomic History   Marital status: Married    Spouse name: Legrand Como   Number of children: 2   Years of education: [employer] education level: Not on file  Occupational History   Occupation: retired    Fish farm manager: UNEMPLOYED  Tobacco Use   Smoking status: Former    Types: Cigarettes    Quit date: 06/05/1998    Years since quitting: 23.8   Smokeless tobacco: Never  Substance and Sexual Activity   Alcohol use: Yes    Alcohol/week: 4.0 standard drinks of alcohol    Types: 4 Cans of beer per week    Comment: daily   Drug use: Yes    Types: Marijuana    Comment: 04/11/18 yes marijuana   Sexual activity: Not on file  Other Topics Concern   Not on file  Social History Narrative   Lives at husband and 2 sons   Right-handed   Regular exercise--yes   Caffeine: daily coffee   Diet: fruits and veggies   Social Determinants of Health   Financial Resource Strain: Low Risk  (04/19/2022)   Overall Financial Resource Strain (CARDIA)    Difficulty of Paying Living Expenses: Not hard at all  Food Insecurity: No Food Insecurity (04/19/2022)   Hunger Vital Sign    Worried About Running Out of Food in the Last Year: Never true    Ran Out of Food in the Last Year: Never true  Transportation Needs: No Transportation Needs (04/19/2022)   PRAPARE - Hydrologist (Medical): No    Lack of Transportation (Non-Medical): No  Physical Activity: Sufficiently Active (04/19/2022)   Exercise Vital Sign    Days of Exercise per Week: 7 days    Minutes of Exercise per Session: 30 min  Stress: No Stress Concern Present (04/19/2022)   [place]    Feeling of Stress : Not at all  Social Connections: Moderately Isolated (04/19/2022)   Social Connection and Isolation Panel [NHANES]    Frequency of Communication with Friends and Family: More than three times a week    Frequency of Social Gatherings with Friends and Family: More than three times a week    Attends Religious Services: Never    Marine scientist or Organizations: No    Attends Archivist Meetings: Never    Marital Status: Married    Tobacco Counseling Counseling given: Not Answered   Clinical Intake:  Pre-visit preparation completed: No  Pain : No/denies pain     BMI - recorded: 18.25 Nutritional Status: BMI <19  Underweight Nutritional Risks: None Diabetes: No  How often do you need to have someone help you when you read instructions, pamphlets, or other written materials from your doctor or pharmacy?: 1 - Never  Diabetic? No     Information entered by :: Rolene Arbour LPN   Activities of Daily Living    04/19/2022    8:49 AM  In your present state of health, do you have any difficulty performing the following activities:  Hearing? 0  Vision? 0  Difficulty concentrating or making decisions? 0  Walking or climbing stairs? 0  Dressing or bathing? 0  Doing errands, shopping? 0  Preparing Food and eating ? N  Using the Toilet? N  In the past six months, have you accidently leaked urine? N  Do you have problems with loss of bowel control? N  Managing your Medications? N  Managing your Finances? N  Housekeeping or managing your Housekeeping? N    Patient Care Team: Jinny Sanders, MD as PCP - General  Indicate any recent Medical Services you may have received from other than [facility] providers in the past year (date may be approximate).     Assessment:   This is a routine wellness examination for Kameryn Davern.  Hearing/Vision screen Hearing Screening - Comments:: Denies  hearing difficulties   Vision Screening - Comments:: up to date with routine eye exams with  [facility]  Dietary issues and exercise activities discussed: Exercise limited by: None identified   Goals Addressed               This Visit's Progress     No current goals (pt-stated)         Depression Screen    04/19/2022    8:47 AM 04/10/2021   11:18 AM 04/04/2020   10:21 AM 04/03/2019   10:16 AM 03/28/2018    9:21 AM 03/25/2017    9:08 AM  PHQ 2/9 Scores  PHQ - 2 Score 0 0 1 0 1 0  PHQ- 9 Score   1  3     Fall Risk    04/19/2022    8:49 AM 04/10/2021   11:17 AM 04/11/2018    9:54 AM 09/30/2017    8:47 AM  Fall Risk   Falls in the past year? 0 0 0 No  Number falls in past yr: 0     Injury with Fall? 0     Risk for fall due to : No Fall Risks     Follow up Falls prevention discussed       [place]:  Any stairs in or around the home? Yes  If so, are there any without handrails? No  Home free of loose throw rugs in walkways, pet beds, electrical cords, etc? Yes  Adequate lighting in your home to reduce risk of falls? Yes   ASSISTIVE DEVICES UTILIZED TO PREVENT FALLS:  Life alert? No  Use of a cane, walker or w/c? No  Grab bars in the bathroom? No  Shower chair or bench in shower? No  Elevated toilet seat or a handicapped toilet? No   TIMED UP AND GO:  Was the test performed? No . Audio Visit    Cognitive Function:        04/19/2022    8:50 AM  6CIT Screen  What Year? 0 points  What month?  0 points  What time? 0 points  Count back from 20 0 points  Months in reverse 0 points  Repeat phrase 0 points  Total Score 0 points    Immunizations Immunization History  Administered Date(s) Administered   Influenza, High Dose Seasonal PF 03/04/2021   Influenza,inj,Quad PF,6+ Mos 03/25/2017, 03/28/2018, 03/01/2019, 03/04/2020   PNEUMOCOCCAL CONJUGATE-20 04/10/2021   Pneumococcal Polysaccharide-23 04/14/2010   Td  11/01/2008   Tdap 04/03/2019      Flu Vaccine status: Up to date    Covid-19 vaccine status: Declined, Education has been provided regarding the importance of this vaccine but patient still declined. Advised may receive this vaccine at local pharmacy or Health Dept.or vaccine clinic. Aware to provide a copy of the vaccination record if obtained from local pharmacy or Health Dept. Verbalized acceptance and understanding.  Qualifies for Shingles Vaccine? Yes   Zostavax completed No   Shingrix Completed?: No.    Education has been provided regarding the importance of this vaccine. Patient has been advised to call insurance company to determine out of pocket expense if they have not yet received this vaccine. Advised may also receive vaccine at local pharmacy or Health Dept. Verbalized acceptance and understanding.  Screening Tests Health Maintenance  Topic Date Due   DEXA SCAN  06/14/2021   Zoster Vaccines- Shingrix (1 of 2) 07/20/2022 (Originally 09/24/2005)   INFLUENZA VACCINE  08/22/2022 (Originally 12/22/2021)   Fecal DNA (Cologuard)  10/18/2022   MAMMOGRAM  03/17/2023   Medicare Annual Wellness (AWV)  04/20/2023   Pneumonia Vaccine 49+ Years old  Completed   Hepatitis C Screening  Completed   HPV VACCINES  Aged Out   COLONOSCOPY (Pts 45-78yr Insurance coverage will need to be confirmed)  Discontinued   COVID-19 Vaccine  Discontinued    Health Maintenance  Health Maintenance Due  Topic Date Due   DEXA SCAN  06/14/2021    Colorectal cancer screening: Type of screening: Cologuard. Completed 09/2719. Repeat every 3 years  Mammogram status: Completed 03/16/22. Repeat every year    Lung Cancer Screening: (Low Dose CT Chest recommended if Age 66-80years, 30 pack-year currently smoking OR have quit w/in 15years.) does not qualify.    Additional Screening:  Hepatitis C Screening: does qualify; Completed 03/05/16  Vision Screening: Recommended annual ophthalmology exams for  early detection of glaucoma and other disorders of the eye. Is the patient up to date with their annual eye exam?  Yes  Who is the provider or what is the name of the [facility] which the patient attends annual [facility] exams[facility] provider, would they like to be referred to a provider to establish care? No .   Dental Screening: Recommended annual dental exams for proper oral hygiene  Community Resource Referral / Chronic Care Management:  CRR required this visit?  No   CCM required this visit?  No      Plan:     I have personally reviewed and noted the following in the patient's chart:   Medical and social history Use of alcohol, tobacco or illicit drugs  Current medications and supplements including opioid prescriptions. Patient is not currently taking opioid prescriptions. Functional ability and status Nutritional status Physical activity Advanced directives List of other physicians Hospitalizations, surgeries, and ER visits in previous 12 months Vitals Screenings to include cognitive, depression, and falls Referrals and appointments  In addition, I have reviewed and discussed with patient certain preventive protocols, quality metrics,  and best practice recommendations. A written personalized care plan for preventive services as well as general preventive health recommendations were provided to patient.     Criselda Peaches, LPN   [id number]   Nurse Notes: None

## 2022-04-20 ENCOUNTER — Ambulatory Visit (INDEPENDENT_AMBULATORY_CARE_PROVIDER_SITE_OTHER): Payer: Medicare Other | Admitting: Family Medicine

## 2022-04-20 ENCOUNTER — Encounter: Payer: Self-pay | Admitting: Family Medicine

## 2022-04-20 VITALS — BP 138/80 | HR 70 | Temp 98.4°F | Ht 68.0 in | Wt 120.1 lb

## 2022-04-20 DIAGNOSIS — E785 Hyperlipidemia, unspecified: Secondary | ICD-10-CM | POA: Diagnosis not present

## 2022-04-20 DIAGNOSIS — E213 Hyperparathyroidism, unspecified: Secondary | ICD-10-CM | POA: Diagnosis not present

## 2022-04-20 DIAGNOSIS — R7303 Prediabetes: Secondary | ICD-10-CM

## 2022-04-20 DIAGNOSIS — E222 Syndrome of inappropriate secretion of antidiuretic hormone: Secondary | ICD-10-CM | POA: Diagnosis not present

## 2022-04-20 DIAGNOSIS — I10 Essential (primary) hypertension: Secondary | ICD-10-CM

## 2022-04-20 DIAGNOSIS — M810 Age-related osteoporosis without current pathological fracture: Secondary | ICD-10-CM

## 2022-04-20 DIAGNOSIS — R569 Unspecified convulsions: Secondary | ICD-10-CM

## 2022-04-20 LAB — BASIC METABOLIC PANEL
BUN: 12 mg/dL (ref 6–23)
CO2: 30 mEq/L (ref 19–32)
Calcium: 10.3 mg/dL (ref 8.4–10.5)
Chloride: 94 mEq/L — ABNORMAL LOW (ref 96–112)
Creatinine, Ser: 0.64 mg/dL (ref 0.40–1.20)
GFR: 92 mL/min (ref >60.00)
Glucose, Bld: 94 mg/dL (ref 70–99)
Potassium: 4.3 mEq/L (ref 3.5–5.1)
Sodium: 131 mEq/L — ABNORMAL LOW (ref 135–145)

## 2022-04-20 NOTE — Assessment & Plan Note (Signed)
Chronic, possible recent worsening versus lab error.  Patient feels well and when she has been this low in the past she typically has more symptoms. She has been restricting fluids .  No known trigger for low sodium other than she has lost weight and maybe is not eating as much in the last several months.  She will avoid skipping meals, continue to restrict fluids and increase salt in her diet We will recheck bmet today  Followed by Dr. Buddy Duty

## 2022-04-20 NOTE — Assessment & Plan Note (Signed)
Chronic, slight worsening in the last year.  Discussed decreasing carbohydrates without further weight loss.  Increase protein source for calories.

## 2022-04-20 NOTE — Assessment & Plan Note (Signed)
Chronic, stable  I would by Dr. Buddy Duty

## 2022-04-20 NOTE — Patient Instructions (Addendum)
Please stop at the lab to have labs drawn.  Work on moderate low carb diet without weight loss. Increase protein in diet.  Do not skip meals.

## 2022-04-20 NOTE — Assessment & Plan Note (Signed)
Stable, chronic.  Continue current medication.  Amlodipine 2.5 mg p.o. daily Metoprolol 25 mg half tablet twice daily

## 2022-04-20 NOTE — Assessment & Plan Note (Signed)
Chronic, lower risk given ratio of HDL to LDL.

## 2022-04-20 NOTE — Assessment & Plan Note (Signed)
Chronic, on Reclast per Dr. Buddy Duty Endo. Scheduled for DEXA in April 2024

## 2022-04-20 NOTE — Assessment & Plan Note (Signed)
Chronic, no seizures on Lamictal 100 mg 2 times daily.  Followed by neurology.

## 2022-04-20 NOTE — Progress Notes (Signed)
Patient ID: Candice Gray, female    DOB: 09/03/1955, 66 y.o.   MRN: [id number]  This visit was conducted in person.  BP 138/80   Pulse 70   Temp 98.4 F (36.9 C) (Oral)   Ht '5\' 8"'$  (1.727 m)   Wt 120 lb 2 oz (54.5 kg)   SpO2 97%   BMI 18.26 kg/m    CC:  Chief Complaint  Patient presents with   Annual Exam    Part 2    Subjective:   HPI: Candice Gray is a 66 y.o. female presenting on 04/20/2022 for Annual Exam (Part 2)  The patient presents for review of chronic health problems. He/She also has the following acute concerns today:  The patient saw a LPN or RN for medicare wellness visit.  Prevention and wellness was reviewed in detail. Note reviewed and important notes copied below.  Hypertension:  At goal in office today on amlodipine 2.5 mg p.o. daily and metoprolol 25 mg half tablet twice daily BP Readings from Last 3 Encounters:  04/20/22 138/80  10/29/21 (!) 175/82  04/10/21 (!) 160/90  Using medication without problems or lightheadedness:  none Chest pain with exertion: none Edema: none Short of breath:none Average home BPs: Other issues:   prediabetes Lab Results  Component Value Date   HGBA1C 6.2 04/13/2022    Elevated Cholesterol: At goal LD very high and stable LDL Lab Results  Component Value Date   CHOL 240 (H) 04/13/2022   HDL 120.30 04/13/2022   LDLCALC 108 (H) 04/13/2022   LDLDIRECT 74.6 12/31/2011   TRIG 58.0 04/13/2022   CHOLHDL 2 04/13/2022  Using medications without problems: Muscle aches:  Diet compliance: stable Exercise:  walking Other complaints:   Hyperparathyroid,SIADH ( recent low at 128 ( was 131 on 02/2022)), low mg followed by ENDO Dr. Buddy Duty. On Reclast for osteoporosis      Seizures:  No seizures. Stable on lamictal for 4 years. Neuro  Dr. Krista Blue    Relevant past medical, surgical, family and social history reviewed and updated as indicated. Interim medical history since our last visit  reviewed. Allergies and medications reviewed and updated. Outpatient Medications Prior to Visit  Medication Sig Dispense Refill   amLODipine (NORVASC) 2.5 MG tablet TAKE 1 TABLET BY MOUTH TWICE DAILY 180 tablet 3   Biotin 1 MG CAPS Take 1 tablet by mouth daily.     Calcium-Vitamin D (CALTRATE 600 PLUS-VIT D PO) Take 1 tablet by mouth daily.     Cholecalciferol (VITAMIN D-3 PO) Take 1 capsule by mouth daily.     hydrocortisone 2.5 % cream Apply 1 application topically as needed.      lamoTRIgine (LAMICTAL) 100 MG tablet Take 1 tablet (100 mg total) by mouth 2 (two) times daily. 180 tablet 4   latanoprost (XALATAN) 0.005 % ophthalmic solution Place 1 drop into both eyes at bedtime.     metoprolol tartrate (LOPRESSOR) 25 MG tablet TAKE 1/2 TABLET BY MOUTH TWICE DAILY 90 tablet 3   vitamin C (ASCORBIC ACID) 500 MG tablet Take 500 mg by mouth daily.     zoledronic acid (RECLAST) 5 MG/100ML SOLN injection Inject 5 mg into the vein once.     No facility-administered medications prior to visit.     Per HPI unless specifically indicated in ROS section below Review of Systems Objective:  BP 138/80   Pulse 70   Temp 98.4 F (36.9 C) (Oral)   Ht '5\' 8"'$  (1.727  m)   Wt 120 lb 2 oz (54.5 kg)   SpO2 97%   BMI 18.26 kg/m   Wt Readings from Last 3 Encounters:  04/20/22 120 lb 2 oz (54.5 kg)  04/19/22 122 lb (55.3 kg)  10/29/21 125 lb (56.7 kg)      Physical Exam    Results for orders placed or performed in visit on 04/13/22  Comprehensive metabolic panel  Result Value Ref Range   Sodium 128 (L) 135 - 145 mEq/L   Potassium 4.3 3.5 - 5.1 mEq/L   Chloride 90 (L) 96 - 112 mEq/L   CO2 32 19 - 32 mEq/L   Glucose, Bld 108 (H) 70 - 99 mg/dL   BUN 11 6 - 23 mg/dL   Creatinine, Ser 0.68 0.40 - 1.20 mg/dL   Total Bilirubin 0.5 0.2 - 1.2 mg/dL   Alkaline Phosphatase 61 39 - 117 U/L   AST 18 0 - 37 U/L   ALT 13 0 - 35 U/L   Total Protein 7.0 6.0 - 8.3 g/dL   Albumin 4.4 3.5 - 5.2 g/dL   GFR  90.67 >60.00 mL/min   Calcium 10.0 8.4 - 10.5 mg/dL  Lipid panel  Result Value Ref Range   Cholesterol 240 (H) 0 - 200 mg/dL   Triglycerides 58.0 0.0 - 149.0 mg/dL   HDL 120.30 >39.00 mg/dL   VLDL 11.6 0.0 - 40.0 mg/dL   LDL Cholesterol 108 (H) 0 - 99 mg/dL   Total CHOL/HDL Ratio 2    NonHDL 119.21   Hemoglobin A1c  Result Value Ref Range   Hgb A1c MFr Bld 6.2 4.6 - 6.5 %     COVID 19 screen:  No recent travel or known exposure to COVID19 The patient denies respiratory symptoms of COVID 19 at this time. The importance of social distancing was discussed today.   Assessment and Plan   The patient's preventative maintenance and recommended screening tests for an annual wellness exam were reviewed in full today. Brought up to date unless services declined.  Counselled on the importance of diet, exercise, and its role in overall health and mortality. The patient's FH and SH was reviewed, including their home life, tobacco status, and drug and alcohol status.   Last DEXA  2015 worsening osteopenia, stopped boniva. 2017 osteoporosis ENDO treating.. Started Reclast 2019.Marland Kitchen  DXA 05/2019 T-2.9 .. due for repeat.. scheduled 09/06/2022 Last colon: 2011 was bengin polyps.. No repeat screen in this way given small caliber colon. Negative Cologuard in 09/2019  Mammo nml 02/2022 nml Pap q 5 years, last nml in 2021  neg HPV, no further indicated. Counseled against marijuana use.   Nonsmoker Hep C: neg Vaccines: refused COVID,  Uptodate with td, PNA and flu, consider shingrix.  Problem List Items Addressed This Visit     Hyperlipidemia    Chronic, lower risk given ratio of HDL to LDL.      Hyperparathyroidism ([place])    Chronic, stable  I would by Dr. Buddy Duty      HYPERTENSION, BENIGN ESSENTIAL, LABILE    Stable, chronic.  Continue current medication.  Amlodipine 2.5 mg p.o. daily Metoprolol 25 mg half tablet twice daily      Osteoporosis    Chronic, on Reclast per Dr. Buddy Duty  Endo. Scheduled for DEXA in April 2024      Prediabetes    Chronic, slight worsening in the last year.  Discussed decreasing carbohydrates without further weight loss.  Increase protein source for calories.  Seizures (HCC)    Chronic, no seizures on Lamictal 100 mg 2 times daily.  Followed by neurology.      SIADH (syndrome of inappropriate ADH production) ([place]) - Primary    Chronic, possible recent worsening versus lab error.  Patient feels well and when she has been this low in the past she typically has more symptoms. She has been restricting fluids .  No known trigger for low sodium other than she has lost weight and maybe is not eating as much in the last several months.  She will avoid skipping meals, continue to restrict fluids and increase salt in her diet We will recheck bmet today  Followed by Dr. Buddy Duty      Relevant Orders   Basic metabolic panel    Eliezer Lofts, MD

## 2022-04-27 ENCOUNTER — Other Ambulatory Visit: Payer: Self-pay | Admitting: Family Medicine

## 2022-06-14 ENCOUNTER — Other Ambulatory Visit: Payer: Self-pay | Admitting: Family Medicine

## 2022-07-07 ENCOUNTER — Telehealth: Payer: Self-pay

## 2022-07-07 NOTE — Telephone Encounter (Signed)
Unable to reach pt; phone line busy x 2. Will try again later.

## 2022-07-07 NOTE — Telephone Encounter (Addendum)
Pt said thinks may have pulled a groin muscle and maybe why hard to go down stairs or get out of car. Pt can walk OK. Pt will keep appt with Dr Diona Browner on 07/08/22 and UC & ED precautions given and pt voiced understanding.sending note to Dr Diona Browner.

## 2022-07-07 NOTE — Telephone Encounter (Signed)
[facility] - Client TELEPHONE ADVICE RECORD [facility] Patient Name: Candice Gray Gender: Female DOB: [birth year] Age: 67 Y 64 M 10 D Return Phone Number: [phone] (Primary), [phone] (Secondary) Address: City/ State/ Zip: [place] [address] Client [facility] - Client Client Site [facility] - Day Provider Eliezer Lofts - MD Contact Type Call Who Is Calling Patient / Member / Family / Caregiver Call Type Triage / Clinical Relationship To Patient Self Return Phone Number [phone] (Primary) Chief Complaint Leg Pain Reason for Call Symptomatic / Request for [place] has osteoporosis and has back pain and right leg pain. Translation No Nurse Assessment Nurse: Rolin Barry, RN, Levada Dy Date/Time Eilene Ghazi Time): 07/07/2022 1:42:23 PM Confirm and document reason for call. If symptomatic, describe symptoms. ---Caller has osteoporosis and has back pain and right leg pain. Pain is in the lower back. Has been going on x 6 weeks. Unknown injury. Caller is having to lift her leg up to move it, worsened this am. Does the patient have any new or worsening symptoms? ---Yes Will a triage be completed? ---Yes Related visit to physician within the last 2 weeks? ---No Does the PT have any chronic conditions? (i.e. diabetes, asthma, this includes High risk factors for pregnancy, etc.) ---Yes List chronic conditions. ---osteoporosis low sodium Is this a behavioral health or substance abuse call? ---No Guidelines Guideline Title Affirmed Question Affirmed Notes Nurse Date/Time (Eastern Time) Back Pain [1] SEVERE back pain (e.g., excruciating, unable to do any normal activities) AND [2] not improved 2 hours after pain medicine Deaton, RN, Levada Dy 07/07/2022 1:45:10 PM PLEASE NOTE: All timestamps contained within this report are represented as [place] Standard  Time. CONFIDENTIALTY NOTICE: This fax transmission is intended only for the addressee. It contains information that is legally privileged, confidential or otherwise protected from use or disclosure. If you are not the intended recipient, you are strictly prohibited from reviewing, disclosing, copying using or disseminating any of this information or taking any action in reliance on or regarding this information. If you have received this fax in error, please notify us immediately by telephone so that we can arrange for its return to [place]. Phone: [phone], Toll-Free: [phone], Fax: [phone] Page: 2 of 2 Call Id: [id number] [place]. Time Eilene Ghazi Time) Disposition Final User 07/07/2022 1:49:22 PM See HCP within 4 Hours (or PCP triage) Yes Deaton, RN, Levada Dy Final Disposition 07/07/2022 1:49:22 PM See HCP within 4 Hours (or PCP triage) Yes Deaton, RN, Cindee Lame Disagree/Comply Comply Caller Understands Yes PreDisposition Did not know what to do Care Advice Given Per Guideline SEE HCP (OR PCP TRIAGE) WITHIN 4 HOURS: * OFFICE: If patient sounds stable and not seriously ill, consult PCP (or follow your office policy) to see if patient can be seen NOW in office. * NAPROXEN (E.G., ALEVE): Take 220 mg (one 220 mg pill) by mouth every 8 to 12 hours as needed. You may take 440 mg (two 220 mg pills) for your first dose. The most you should take is 3 pills a day (660 mg total). Note: In [place], the maximum is 2 pills a day (one every 12 hours; 440 mg total). * IBUPROFEN (E.G., MOTRIN, ADVIL): Take 400 mg (two 200 mg pills) by mouth every 6 hours. The most you should take is 6 pills a day (1,200 mg total). CALL BACK IF: * You become worse CARE ADVICE given per Back Pain (Adult) guideline. Comments User: Saverio Danker,  RN Date/Time Eilene Ghazi Time): 07/07/2022 1:53:06 PM Office in a staff meeting. Caller will call the office after 2 pm to see if they have appts. Referrals Warm transfer to backlin

## 2022-07-07 NOTE — Telephone Encounter (Signed)
Noted  

## 2022-07-07 NOTE — Telephone Encounter (Signed)
Looks like patient has been scheduled to see Dr. Diona Browner 07/08/2022 at 9:00 am.

## 2022-07-08 ENCOUNTER — Ambulatory Visit (INDEPENDENT_AMBULATORY_CARE_PROVIDER_SITE_OTHER): Payer: Medicare Other | Admitting: Family Medicine

## 2022-07-08 ENCOUNTER — Ambulatory Visit (INDEPENDENT_AMBULATORY_CARE_PROVIDER_SITE_OTHER)
Admission: RE | Admit: 2022-07-08 | Discharge: 2022-07-08 | Disposition: A | Discharge status: Home / Self Care | Payer: Medicare Other | Source: Ambulatory Visit | Attending: Family Medicine | Admitting: Family Medicine

## 2022-07-08 ENCOUNTER — Encounter: Payer: Self-pay | Admitting: Family Medicine

## 2022-07-08 VITALS — BP 124/70 | HR 76 | Temp 97.9°F | Ht 68.0 in | Wt 121.5 lb

## 2022-07-08 DIAGNOSIS — S76211A Strain of adductor muscle, fascia and tendon of right thigh, initial encounter: Secondary | ICD-10-CM | POA: Insufficient documentation

## 2022-07-08 DIAGNOSIS — M545 Low back pain, unspecified: Secondary | ICD-10-CM | POA: Diagnosis not present

## 2022-07-08 DIAGNOSIS — M810 Age-related osteoporosis without current pathological fracture: Secondary | ICD-10-CM

## 2022-07-08 NOTE — Assessment & Plan Note (Signed)
Chronic, increased risk for fracture.  Upcoming bone density in April 2024

## 2022-07-08 NOTE — Progress Notes (Addendum)
Patient ID: Candice Gray, female    DOB: [birth year], 67 y.o.   MRN: [id number]  This visit was conducted in person.  BP 124/70   Pulse 76   Temp 97.9 F (36.6 C) (Temporal)   Ht 5' 8"$  (1.727 m)   Wt 121 lb 8 oz (55.1 kg)   SpO2 97%   BMI 18.47 kg/m    CC:  Chief Complaint  Patient presents with   Back Pain    X 6 weeks    Subjective:   HPI: Candice Gray is a 67 y.o. female presenting on 07/08/2022 for Back Pain (X 6 weeks)  Back Pain This is a new problem. The current episode started more than 1 month ago (6 weeks increase pain, has history of low back pain off and on for years). The problem occurs constantly. The problem has been gradually worsening (has been standing up more cooking lately.) since onset. The pain is present in the lumbar spine (more on left). The quality of the pain is described as aching. The pain does not radiate (walking differently and feels she may have pulled a groin muscle on rightt). The pain is at a severity of 7/10. The pain is moderate. The pain is The same all the time. The symptoms are aggravated by standing and bending. Stiffness is present All day. Pertinent negatives include no bladder incontinence, bowel incontinence, dysuria, fever, numbness, paresthesias, pelvic pain, perianal numbness, tingling or weakness. Risk factors include history of osteoporosis and lack of exercise. She has tried NSAIDs (off and on aleve) for the symptoms. The treatment provided mild relief.     Not exercising as much as she used to.   No known falls or specific injuries.   Hx of osteoporosis.  Relevant past medical, surgical, family and social history reviewed and updated as indicated. Interim medical history since our last visit reviewed. Allergies and medications reviewed and updated. Outpatient Medications Prior to Visit  Medication Sig Dispense Refill   amLODipine (NORVASC) 2.5 MG tablet TAKE 1 TABLET BY MOUTH TWICE DAILY 180 tablet 3   Biotin  1 MG CAPS Take 1 tablet by mouth daily.     Calcium-Vitamin D (CALTRATE 600 PLUS-VIT D PO) Take 1 tablet by mouth daily.     Cholecalciferol (VITAMIN D-3 PO) Take 1 capsule by mouth daily.     hydrocortisone 2.5 % cream Apply 1 application topically as needed.      lamoTRIgine (LAMICTAL) 100 MG tablet Take 1 tablet (100 mg total) by mouth 2 (two) times daily. 180 tablet 4   latanoprost (XALATAN) 0.005 % ophthalmic solution Place 1 drop into both eyes at bedtime.     metoprolol tartrate (LOPRESSOR) 25 MG tablet TAKE 1/2 TABLET BY MOUTH TWICE DAILY 90 tablet 3   vitamin C (ASCORBIC ACID) 500 MG tablet Take 500 mg by mouth daily.     zoledronic acid (RECLAST) 5 MG/100ML SOLN injection Inject 5 mg into the vein once.     No facility-administered medications prior to visit.     Per HPI unless specifically indicated in ROS section below Review of Systems  Constitutional:  Negative for fever.  Gastrointestinal:  Negative for bowel incontinence.  Genitourinary:  Negative for bladder incontinence, dysuria and pelvic pain.  Musculoskeletal:  Positive for back pain.  Neurological:  Negative for tingling, weakness, numbness and paresthesias.   Objective:  BP 124/70   Pulse 76   Temp 97.9 F (36.6 C) (Temporal)   Ht  $5' 8"r$  (1.727 m)   Wt 121 lb 8 oz (55.1 kg)   SpO2 97%   BMI 18.47 kg/m   Wt Readings from Last 3 Encounters:  07/08/22 121 lb 8 oz (55.1 kg)  04/20/22 120 lb 2 oz (54.5 kg)  04/19/22 122 lb (55.3 kg)      Physical Exam Constitutional:      General: She is not in acute distress.    Appearance: Normal appearance. She is well-developed. She is not ill-appearing or toxic-appearing.  HENT:     Head: Normocephalic.     Right Ear: Hearing, tympanic membrane, ear canal and external ear normal. Tympanic membrane is not erythematous, retracted or bulging.     Left Ear: Hearing, tympanic membrane, ear canal and external ear normal. Tympanic membrane is not erythematous, retracted or  bulging.     Nose: No mucosal edema or rhinorrhea.     Right Sinus: No maxillary sinus tenderness or frontal sinus tenderness.     Left Sinus: No maxillary sinus tenderness or frontal sinus tenderness.     Mouth/Throat:     Pharynx: Uvula midline.  Eyes:     General: Lids are normal. Lids are everted, no foreign bodies appreciated.     Conjunctiva/sclera: Conjunctivae normal.     Pupils: Pupils are equal, round, and reactive to light.  Neck:     Thyroid: No thyroid mass or thyromegaly.     Vascular: No carotid bruit.     Trachea: Trachea normal.  Cardiovascular:     Rate and Rhythm: Normal rate and regular rhythm.     Pulses: Normal pulses.     Heart sounds: Normal heart sounds, S1 normal and S2 normal. No murmur heard.    No friction rub. No gallop.  Pulmonary:     Effort: Pulmonary effort is normal. No tachypnea or respiratory distress.     Breath sounds: Normal breath sounds. No decreased breath sounds, wheezing, rhonchi or rales.  Abdominal:     General: Bowel sounds are normal.     Palpations: Abdomen is soft.     Tenderness: There is no abdominal tenderness.  Musculoskeletal:     Cervical back: Normal range of motion and neck supple.     Lumbar back: Tenderness and bony tenderness present. Decreased range of motion. Positive left straight leg raise test. Negative right straight leg raise test.     Comments:  Pain with sitting down hard and bending back Slightly positive SLR on left  Mild ttp over right groin  Skin:    General: Skin is warm and dry.     Findings: No rash.  Neurological:     Mental Status: She is alert.  Psychiatric:        Mood and Affect: Mood is not anxious or depressed.        Speech: Speech normal.        Behavior: Behavior normal. Behavior is cooperative.        Thought Content: Thought content normal.        Judgment: Judgment normal.       Results for orders placed or performed in visit on 123XX123  Basic metabolic panel  Result Value Ref  Range   Sodium 131 (L) 135 - 145 mEq/L   Potassium 4.3 3.5 - 5.1 mEq/L   Chloride 94 (L) 96 - 112 mEq/L   CO2 30 19 - 32 mEq/L   Glucose, Bld 94 70 - 99 mg/dL   BUN 12 6 - 23 mg/dL  Creatinine, Ser 0.64 0.40 - 1.20 mg/dL   GFR 92.00 >60.00 mL/min   Calcium 10.3 8.4 - 10.5 mg/dL    Assessment and Plan  Lumbar back pain Assessment & Plan: Acute on chronic issue Given osteoporosis and vertebral tenderness we will evaluate with an x-ray to rule out compression fracture.  Less likely pain is secondary to arthritis versus musculoskeletal strain. Treat with increased dose of anti-inflammatory, heat and home physical therapy if no fracture seen.  Orders: -     DG Lumbar Spine Complete; Future  Age related osteoporosis, unspecified pathological fracture presence Assessment & Plan: Chronic, increased risk for fracture.  Upcoming bone density in April 2024  Orders: -     DG Lumbar Spine Complete; Future  Strain of right groin Assessment & Plan: Ice, gentle stretching, anti-inflammatories as needed     Return if symptoms worsen or fail to improve.   Eliezer Lofts, MD

## 2022-07-08 NOTE — Addendum Note (Signed)
Addended by: Eliezer Lofts E on: 07/08/2022 10:06 AM   Modules accepted: Orders

## 2022-07-08 NOTE — Assessment & Plan Note (Signed)
Ice, gentle stretching, anti-inflammatories as needed

## 2022-07-08 NOTE — Patient Instructions (Signed)
Heat on low back twice daily  Start aleve 2 tabs twice daily.  We will call with X-ray results.   Start home physical therapy.

## 2022-07-08 NOTE — Assessment & Plan Note (Signed)
Acute on chronic issue Given osteoporosis and vertebral tenderness we will evaluate with an x-ray to rule out compression fracture.  Less likely pain is secondary to arthritis versus musculoskeletal strain. Treat with increased dose of anti-inflammatory, heat and home physical therapy if no fracture seen.

## 2022-07-13 ENCOUNTER — Encounter: Payer: Self-pay | Admitting: Family Medicine

## 2022-07-14 ENCOUNTER — Encounter: Payer: Self-pay | Admitting: Family Medicine

## 2022-07-14 DIAGNOSIS — I700 Atherosclerosis of aorta: Secondary | ICD-10-CM | POA: Insufficient documentation

## 2022-07-21 MED ORDER — ATORVASTATIN CALCIUM 10 MG PO TABS: 10.0000 mg | ORAL_TABLET | ORAL | 3 refills | Status: DC

## 2022-07-26 ENCOUNTER — Encounter: Payer: Self-pay | Admitting: Gastroenterology

## 2022-09-06 ENCOUNTER — Ambulatory Visit
Admission: RE | Admit: 2022-09-06 | Discharge: 2022-09-06 | Disposition: A | Discharge status: Home / Self Care | Payer: Medicare Other | Source: Ambulatory Visit | Attending: Internal Medicine | Admitting: Internal Medicine

## 2022-09-06 DIAGNOSIS — M810 Age-related osteoporosis without current pathological fracture: Secondary | ICD-10-CM

## 2022-09-08 ENCOUNTER — Telehealth: Payer: Self-pay

## 2022-09-08 NOTE — Telephone Encounter (Signed)
Pt said was started on atorvastatin 10 mg on Mon,Wed and Fri.the end of Feb 2024. Pt said about mid March pt began with pain with sitting or getting up from sitting or laying position; pain level now 6 - 7 if getting up. Pt said her back is OK now with no pain in back. Pt said has no problem or pain when up moving around and walking. Pt said she has pain at the top of both legs. Pt said she is sore like she has done a lot of yard work but pt has not been exercising or doing anything strenuous. No reason for upper leg pains.pt said she has taken aleve a couple of times and that does seem to help leg pain a little but pt would prefer not to take anything for pain. Hot showers do not reduce the upper leg pain. Pt request cb after Dr Ermalene Searing reviews the note. Pt wondering if appropriate to stop atorvastin for few days or weeks to see if pain goes away. [facility]. Sending note to Dr Ermalene Searing and [facility] [place] [facility].

## 2022-09-09 NOTE — Telephone Encounter (Signed)
Candice Gray notified as instructed by telephone. Patient states understanding.

## 2022-09-09 NOTE — Telephone Encounter (Signed)
Call  Have  her hold the atorvastatin for 1 week.  If symptoms not resolving she needs to be seen for further evaluation and to restart atorvastatin..  If symptoms significantly better have her restart the atorvastatin at half a tablet Monday Wednesday Friday in addition to starting coenzyme Q 10 which is over-the-counter and can help with muscle side effects from cholesterol medication

## 2022-10-06 ENCOUNTER — Encounter: Payer: Self-pay | Admitting: Family Medicine

## 2022-10-06 ENCOUNTER — Ambulatory Visit (INDEPENDENT_AMBULATORY_CARE_PROVIDER_SITE_OTHER): Payer: Medicare Other | Admitting: Family Medicine

## 2022-10-06 VITALS — BP 120/70 | HR 96 | Temp 97.4°F | Ht 68.0 in | Wt 120.0 lb

## 2022-10-06 DIAGNOSIS — J320 Chronic maxillary sinusitis: Secondary | ICD-10-CM | POA: Diagnosis not present

## 2022-10-06 MED ORDER — FEXOFENADINE HCL 180 MG PO TABS: 180.0000 mg | ORAL_TABLET | Freq: Every day | ORAL | 0 refills | Status: DC

## 2022-10-06 MED ORDER — PREDNISONE 20 MG PO TABS: ORAL_TABLET | ORAL | 0 refills | Status: DC

## 2022-10-06 NOTE — Assessment & Plan Note (Signed)
Chronic, given months of symptoms without fever, unilateral pain or purulent mucus, this is most consistent with allergic sinusitis.  Recommend prednisone taper and fexofenadine 180 mg p.o. daily.  If not improving as expected can consider treatment to cover for bacterial infection.  Return and ER precautions provided

## 2022-10-06 NOTE — Progress Notes (Signed)
Patient ID: [name], female    DOB: December 14, 1955, 67 y.o.   MRN: [id number]  This visit was conducted in person.  BP 120/70 (BP Location: Left Arm, Patient Position: Sitting, Cuff Size: Normal)   Pulse 96   Temp (!) 97.4 F (36.3 C) (Temporal)   Ht 5\' 8"  (1.727 m)   Wt 120 lb (54.4 kg)   SpO2 99%   BMI 18.25 kg/m    CC:  Chief Complaint  Patient presents with   Sinus Problem    Patient has been having sinus pressure and headaches x several months. Patient has been taking aleve as needed but tried not to take often.    Muscle Pain    Patient states muscle pain is not much better after stopping atorvastatin. Restarted Monday with 0.5 tab every M, W and F.    Subjective:   HPI: Lu Meryn Styer is a 67 y.o. female presenting on 10/06/2022 for Sinus Problem (Patient has been having sinus pressure and headaches x several months. Patient has been taking aleve as needed but tried not to take often. ) and Muscle Pain (Patient states muscle pain is not much better after stopping atorvastatin. Restarted Monday with 0.5 tab every M, W and F.)   Date of onset: Several months Initial symptoms included  sinus pressure and headache   Feeling tired, not great over all.  Upper jaw sore, off and on.  Worsen ringing in ears, no ear pressure  Occ nasal discharge... mainly clear.  No cough,  occ PND. No fever.   She has deviated septum..   No dental issues   Sick contacts: None COVID testing:   none    She has tried to treat with Aleve as needed  Using saline nasal spray,,, twice daily.    No history of chronic lung disease such as asthma or COPD. Former smoker.   No history of migraine.  She has also continued to have muscle ache and pain despite stopping atorvastatin.  Therefore she was restarted 3 days a week with with half a tablet      Relevant past medical, surgical, family and social history reviewed and updated as indicated. Interim medical history since our  last visit reviewed. Allergies and medications reviewed and updated. Outpatient Medications Prior to Visit  Medication Sig Dispense Refill   amLODipine (NORVASC) 2.5 MG tablet TAKE 1 TABLET BY MOUTH TWICE DAILY 180 tablet 3   atorvastatin (LIPITOR) 10 MG tablet Take 1 tablet (10 mg total) by mouth every Monday, Wednesday, and Friday. 36 tablet 3   Biotin 1 MG CAPS Take 1 tablet by mouth daily.     Calcium-Vitamin D (CALTRATE 600 PLUS-VIT D PO) Take 1 tablet by mouth daily.     Cholecalciferol (VITAMIN D-3 PO) Take 1 capsule by mouth daily.     hydrocortisone 2.5 % cream Apply 1 application topically as needed.      lamoTRIgine (LAMICTAL) 100 MG tablet Take 1 tablet (100 mg total) by mouth 2 (two) times daily. 180 tablet 4   latanoprost (XALATAN) 0.005 % ophthalmic solution Place 1 drop into both eyes at bedtime.     metoprolol tartrate (LOPRESSOR) 25 MG tablet TAKE 1/2 TABLET BY MOUTH TWICE DAILY 90 tablet 3   vitamin C (ASCORBIC ACID) 500 MG tablet Take 500 mg by mouth daily.     zoledronic acid (RECLAST) 5 MG/100ML SOLN injection Inject 5 mg into the vein once.     No facility-administered  medications prior to visit.     Per HPI unless specifically indicated in ROS section below Review of Systems  Constitutional:  Positive for fatigue. Negative for fever.  HENT:  Positive for sinus pressure, sinus pain and tinnitus. Negative for congestion, postnasal drip, rhinorrhea and sore throat.   Eyes:  Negative for pain.  Respiratory:  Negative for cough and shortness of breath.   Cardiovascular:  Negative for chest pain, palpitations and leg swelling.  Gastrointestinal:  Negative for abdominal pain.  Genitourinary:  Negative for dysuria and vaginal bleeding.  Musculoskeletal:  Negative for back pain.  Neurological:  Negative for syncope, light-headedness and headaches.  Psychiatric/Behavioral:  Negative for dysphoric mood.    Objective:  BP 120/70 (BP Location: Left Arm, Patient Position:  Sitting, Cuff Size: Normal)   Pulse 96   Temp (!) 97.4 F (36.3 C) (Temporal)   Ht 5\' 8"  (1.727 m)   Wt 120 lb (54.4 kg)   SpO2 99%   BMI 18.25 kg/m   Wt Readings from Last 3 Encounters:  10/06/22 120 lb (54.4 kg)  07/08/22 121 lb 8 oz (55.1 kg)  04/20/22 120 lb 2 oz (54.5 kg)      Physical Exam Constitutional:      General: She is not in acute distress.    Appearance: Normal appearance. She is well-developed. She is not ill-appearing or toxic-appearing.  HENT:     Head: Normocephalic.     Right Ear: Hearing, tympanic membrane, ear canal and external ear normal. Tympanic membrane is not erythematous, retracted or bulging.     Left Ear: Hearing, tympanic membrane, ear canal and external ear normal. Tympanic membrane is not erythematous, retracted or bulging.     Nose: No mucosal edema or rhinorrhea.     Right Sinus: Maxillary sinus tenderness present. No frontal sinus tenderness.     Left Sinus: Maxillary sinus tenderness present. No frontal sinus tenderness.     Mouth/Throat:     Pharynx: Uvula midline.  Eyes:     General: Lids are normal. Lids are everted, no foreign bodies appreciated.     Conjunctiva/sclera: Conjunctivae normal.     Pupils: Pupils are equal, round, and reactive to light.  Neck:     Thyroid: No thyroid mass or thyromegaly.     Vascular: No carotid bruit.     Trachea: Trachea normal.  Cardiovascular:     Rate and Rhythm: Normal rate and regular rhythm.     Pulses: Normal pulses.     Heart sounds: Normal heart sounds, S1 normal and S2 normal. No murmur heard.    No friction rub. No gallop.  Pulmonary:     Effort: Pulmonary effort is normal. No tachypnea or respiratory distress.     Breath sounds: Normal breath sounds. No decreased breath sounds, wheezing, rhonchi or rales.  Abdominal:     General: Bowel sounds are normal.     Palpations: Abdomen is soft.     Tenderness: There is no abdominal tenderness.  Musculoskeletal:     Cervical back: Normal  range of motion and neck supple.  Skin:    General: Skin is warm and dry.     Findings: No rash.  Neurological:     Mental Status: She is alert.  Psychiatric:        Mood and Affect: Mood is not anxious or depressed.        Speech: Speech normal.        Behavior: Behavior normal. Behavior is cooperative.  Thought Content: Thought content normal.        Judgment: Judgment normal.       Results for orders placed or performed in visit on 04/20/22  Basic metabolic panel  Result Value Ref Range   Sodium 131 (L) 135 - 145 mEq/L   Potassium 4.3 3.5 - 5.1 mEq/L   Chloride 94 (L) 96 - 112 mEq/L   CO2 30 19 - 32 mEq/L   Glucose, Bld 94 70 - 99 mg/dL   BUN 12 6 - 23 mg/dL   Creatinine, Ser 8.41 0.40 - 1.20 mg/dL   GFR 32.44 >01.02 mL/min   Calcium 10.3 8.4 - 10.5 mg/dL    Assessment and Plan  Chronic maxillary sinusitis Assessment & Plan: Chronic, given months of symptoms without fever, unilateral pain or purulent mucus, this is most consistent with allergic sinusitis.  Recommend prednisone taper and fexofenadine 180 mg p.o. daily.  If not improving as expected can consider treatment to cover for bacterial infection.  Return and ER precautions provided   Other orders -     predniSONE; 3 tabs by mouth daily x 3 days, then 2 tabs by mouth daily x 2 days then 1 tab by mouth daily x 2 days  Dispense: 15 tablet; Refill: 0 -     Fexofenadine HCl; Take 1 tablet (180 mg total) by mouth daily.  Dispense: 30 tablet; Refill: 0    No follow-ups on file.   Kerby Nora, MD

## 2022-11-01 ENCOUNTER — Encounter: Payer: Self-pay | Admitting: Neurology

## 2022-11-01 ENCOUNTER — Ambulatory Visit (INDEPENDENT_AMBULATORY_CARE_PROVIDER_SITE_OTHER): Payer: Medicare Other | Admitting: Neurology

## 2022-11-01 VITALS — BP 135/83 | HR 66 | Ht 68.0 in | Wt 121.0 lb

## 2022-11-01 DIAGNOSIS — R569 Unspecified convulsions: Secondary | ICD-10-CM

## 2022-11-01 MED ORDER — LAMOTRIGINE 100 MG PO TABS: 100.0000 mg | ORAL_TABLET | Freq: Two times a day (BID) | ORAL | 4 refills | Status: DC

## 2022-11-01 NOTE — Progress Notes (Signed)
PATIENT: Candice Gray DOB: 1956-03-13  REASON FOR VISIT: follow up HISTORY FROM: patient PRIMARY NEUROLOGIST: Dr. Terrace Arabia   HISTORY OF PRESENT ILLNESS: Candice Gray is a 67 years old right-handed female, seen in refer by her primary care doctor  Excell Seltzer for evaluation of seizure on March 25 2016, initial evaluation was on April 01 2016.   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   She was put on water restriction 64 ounces daily ever since, is under close supervision of endocrinologist, she reported frequent thirsty recently, has liberated her water intake some,   On March 25 2016 around 10:00 in the morning, she noticed mild dizziness, sat down in the chair, lost consciousness, woke up on ambulance confused, she has a witnessed tonic-clonic seizure activity, her body becoming rigid, mouth open, eyes rolled back, lasting for a few minutes, followed by post event confusion.    I personally reviewed MRI of the brain on March 25 2016, mild generalized atrophy no acute abnormality    I reviewed her laboratory evaluation in November, glucose was 123, sodium was 129, potassium was 3.2, chloride was 90, glucose was 118, creatinine 0.78, WBC was mildly elevated 13, hemoglobin was 15 point 1, magnesium 1.6, A1c was 5.5, normal TSH, BNP was 112, magnesium 1.8, UDS was positive for marijuana, alcohol was less than 5, negative hepatitis C,    EEG showed: posterior dominant rhythm of 9 Hz reactive eye opening and closure., there are epileptiform discharges per description with maximum at F7 and T3. These do not evolve into electrographic seizures.  No sleep was recorded.   She was treated with Keppra 500 mg 3 times a day, she complains of being anxious about her health, get irritated easily, she does smoke marijuana  regularly, now she has stopped smoking.   UPDATE Sep 29 2016:YY She is doing well, able to tolerate lamotrigine 100 mg twice a day much better, no recurrent seizure,  UPDATE October 30 2019: She is overall doing well, there was no recurrent seizure, tolerating lamotrigine 100 mg twice a day, still under close supervision of her primary care for hyponatremia, under water restriction of 64 ounce daily, she still use marijuana every day  Most recent laboratory evaluation November 2020, A1c 5.7, CMP showed mildly low sodium 132, chloride 93, calcium 10.8, lipid panel elevated cholesterol 231, LDL 100  Update October 29, 2020 SS: Reports last Friday night, 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 Spell last year after mother died, pure exhaustion from planning service, babbling talking to friend, got something to eat, felt much better. Remains on Lamictal 100 mg twice daily. BP was higher last week than normal, can sometimes indicate sodium level was low. Sees endocr[facility]ology at [facility]. Is on fluid restriction, 64 oz is daily limit, generous with salt. Sodium 131 in Nov 2021. Only 1 seizure ever, not really sure what to look for with seizures.  Update 10/29/21 SS: Things are going well, no seizures, remains on Lamictal 100 mg twice daily,  Labs 03/04/21 sodium 132, chloride 94. Sees endocrinology annually. Limits her water intake. Is pleased she has gained 5 lbs.   Update November 01, 2022 SS: Labs 10/29/21 Lamictal level 6.6, sodium 132.  No seizures. Remains on Lamictal 100 mg BID. Sodium level stays in the 130's. Remains on fluid restriction. Sees endocrinology annually. Has struggled to tolerate Lipitor, LDL 108. Takes every other day.    REVIEW OF SYSTEMS: Out of a complete 14 system review of symptoms, the patient complains only of the following symptoms, and all other reviewed systems are negative.  See  HPI  ALLERGIES: Allergies  Allergen Reactions   Azelastine Hcl     REACTION: increased bp   Depo-Medrol [Methylprednisolone] Itching   Miralax [Polyethylene Glycol] Itching   Red Dye Other (See Comments)    Contact Dermatitis   Zyban [Bupropion] Swelling    HOME MEDICATIONS: Outpatient Medications Prior to Visit  Medication Sig Dispense Refill   amLODipine (NORVASC) 2.5 MG tablet TAKE 1 TABLET BY MOUTH TWICE DAILY 180 tablet 3   atorvastatin (LIPITOR) 10 MG tablet Take 1 tablet (10 mg total) by mouth every Monday, Wednesday, and Friday. 36 tablet 3   Biotin 1 MG CAPS Take 1 tablet by mouth daily.     Calcium-Vitamin D (CALTRATE 600 PLUS-VIT D PO) Take 1 tablet by mouth daily.     Cholecalciferol (VITAMIN D-3 PO) Take 1 capsule by mouth daily.     fexofenadine (ALLEGRA) 180 MG tablet Take 1 tablet (180 mg total) by mouth daily. 30 tablet 0   hydrocortisone 2.5 % cream Apply 1 application topically as needed.      latanoprost (XALATAN) 0.005 % ophthalmic solution Place 1 drop into both eyes at bedtime.     metoprolol tartrate (LOPRESSOR) 25 MG tablet TAKE 1/2 TABLET BY MOUTH TWICE DAILY 90 tablet 3   predniSONE (DELTASONE) 20 MG tablet 3 tabs by mouth daily x 3 days, then 2 tabs by mouth daily x 2 days then 1 tab by mouth daily x 2 days 15 tablet 0   vitamin C (ASCORBIC ACID) 500 MG tablet Take 500 mg by mouth daily.     zoledronic acid (RECLAST) 5 MG/100ML SOLN injection Inject 5 mg into the vein once.     lamoTRIgine (LAMICTAL) 100 MG tablet Take 1 tablet (100 mg total) by mouth 2 (two) times daily. 180 tablet 4   No facility-administered medications prior to visit.    PAST MEDICAL HISTORY: Past Medical History:  Diagnosis Date   Anxiety    Dermatitis, seborrheic 2018   Deviated nasal septum    Diverticulitis    Diverticulosis    HTN (hypertension)    Hyperlipemia    Hyperplastic colon polyp    Hyponatremia    Hyponatremia    MVP (mitral valve prolapse)    Raynaud  phenomenon    Seizures (HCC)    SIADH (syndrome of inappropriate ADH production) (HCC)     PAST SURGICAL HISTORY: Past Surgical History:  Procedure Laterality Date   ABDOMINAL HYSTERECTOMY     CATARACT EXTRACTION, BILATERAL  2019   CRYOTHERAPY     for cervical dysplasia   NM MYOCAR PERF WALL MOTION  11/2007   bruce myoview; perfusion defect in anterior myocardium (breast attenuation); post-stress EF 77%; normal, low risk study    TRANSTHORACIC ECHOCARDIOGRAM  11/2007   EF=>55%; mild MR; trace TR   TUBAL LIGATION      FAMILY HISTORY: Family History  Problem Relation Age of Onset   Hyperlipidemia Father    Hypertension Father    Diabetes Father    Atrial fibrillation Father    Diabetes Paternal Grandmother    Cancer Paternal Grandfather  brain tumor   Hyperlipidemia Other    Hypertension Other    Coronary artery disease Maternal Grandmother    Diabetes Maternal Grandmother    Hyperlipidemia Brother    Transient ischemic attack Mother    Dementia Mother    Colon cancer Neg Hx     SOCIAL HISTORY: Social History   Socioeconomic History   Marital status: Married    Spouse name: Casimiro Needle   Number of children: 2   Years of education: [employer] education level: Bachelor's degree (e.g., BA, AB, BS)  Occupational History   Occupation: retired    Associate Professor: UNEMPLOYED  Tobacco Use   Smoking status: Former    Types: Cigarettes    Quit date: 06/05/1998    Years since quitting: 24.4   Smokeless tobacco: Never  Substance and Sexual Activity   Alcohol use: Yes    Alcohol/week: 4.0 standard drinks of alcohol    Types: 4 Cans of beer per week    Comment: daily   Drug use: Yes    Types: Marijuana    Comment: 04/11/18 yes marijuana   Sexual activity: Not on file  Other Topics Concern   Not on file  Social History Narrative   Lives at husband and 2 sons   Right-handed   Regular exercise--yes   Caffeine: daily coffee   Diet: fruits and veggies   Social  Determinants of Health   Financial Resource Strain: Low Risk  (10/05/2022)   Overall Financial Resource Strain (CARDIA)    Difficulty of Paying Living Expenses: Not hard at all  Food Insecurity: No Food Insecurity (10/05/2022)   Hunger Vital Sign    Worried About Running Out of Food in the Last Year: Never true    Ran Out of Food in the Last Year: Never true  Transportation Needs: No Transportation Needs (10/05/2022)   PRAPARE - Administrator, Civil Service (Medical): No    Lack of Transportation (Non-Medical): No  Physical Activity: Unknown (10/05/2022)   Exercise Vital Sign    Days of Exercise per Week: Patient declined    Minutes of Exercise per Session: 30 min  Stress: No Stress Concern Present (10/05/2022)   [employer] of Occupational Health - Occupational Stress Questionnaire    Feeling of Stress : Not at all  Social Connections: Unknown (10/05/2022)   Social Connection and Isolation Panel [NHANES]    Frequency of Communication with Friends and Family: Once a week    Frequency of Social Gatherings with Friends and Family: Patient declined    Attends Religious Services: Never    Database administrator or Organizations: No    Attends Banker Meetings: Never    Marital Status: Married  Catering manager Violence: Not At Risk (04/19/2022)   Humiliation, Afraid, Rape, and Kick questionnaire    Fear of Current or Ex-Partner: No    Emotionally Abused: No    Physically Abused: No    Sexually Abused: No   PHYSICAL EXAM  Vitals:   11/01/22 0744  BP: 135/83  Pulse: 66  Weight: 121 lb (54.9 kg)  Height: 5\' 8"  (1.727 m)   Body mass index is 18.4 kg/m.  Generalized: Well developed, in no acute distress   Neurological examination  Mentation: Alert oriented to time, place, history taking. Follows all commands speech and language fluent Cranial nerve II-XII: Pupils were equal round reactive to light. Extraocular movements were full, visual field were  full on confrontational test. Facial sensation and strength  were normal. Head turning and shoulder shrug were normal and symmetric. Motor: 5/5 strength bilaterally  Sensory: Sensory testing is intact to light touch and vibratory sensation Coordination: Finger nose finger and heel to shin was normal bilaterally  Gait and station: Gait is normal.  Reflexes: Deep tendon reflexes are symmetric and normal bilaterally.   DIAGNOSTIC DATA (LABS, IMAGING, TESTING) - I reviewed patient records, labs, notes, testing and imaging myself where available.  Lab Results  Component Value Date   WBC 9.4 10/29/2021   HGB 13.9 10/29/2021   HCT 40.9 10/29/2021   MCV 95 10/29/2021   PLT 300 10/29/2021      Component Value Date/Time   NA 131 (L) 04/20/2022 1050   NA 132 (L) 10/29/2021 0950   K 4.3 04/20/2022 1050   CL 94 (L) 04/20/2022 1050   CO2 30 04/20/2022 1050   GLUCOSE 94 04/20/2022 1050   BUN 12 04/20/2022 1050   BUN 11 10/29/2021 0950   CREATININE 0.64 04/20/2022 1050   CALCIUM 10.3 04/20/2022 1050   PROT 7.0 04/13/2022 0909   PROT 7.6 10/29/2021 0950   ALBUMIN 4.4 04/13/2022 0909   ALBUMIN 4.9 (H) 10/29/2021 0950   AST 18 04/13/2022 0909   ALT 13 04/13/2022 0909   ALKPHOS 61 04/13/2022 0909   BILITOT 0.5 04/13/2022 0909   BILITOT 0.3 10/29/2021 0950   GFRNONAA 89 03/04/2021 0000   GFRAA 107 10/31/2018 1040   Lab Results  Component Value Date   CHOL 240 (H) 04/13/2022   HDL 120.30 04/13/2022   LDLCALC 108 (H) 04/13/2022   LDLDIRECT 74.6 12/31/2011   TRIG 58.0 04/13/2022   CHOLHDL 2 04/13/2022   Lab Results  Component Value Date   HGBA1C 6.2 04/13/2022   Lab Results  Component Value Date   VITAMINB12 390 12/20/2012   Lab Results  Component Value Date   TSH 1.824 03/25/2016    ASSESSMENT AND PLAN 67 y.o. year old female  1. Seizure on March 25, 2016  -Seizure episode occurred in setting of hyponatremia in 2017, sodium level was 129, continues to follow with  endocrinology, on fluid restriction -No recent seizures, continue Lamictal 100 mg BID, refilled today  -Update labs, CBC, CMP,. Lamictal  -MRI of the brain showed generalized atrophy, no acute abnormality -EEG showed focal slowing of left to mid temporal lobe -Follow-up in 1 year or sooner if needed   Margie Ege, Edrick Oh, DNP  [facility] [address], [address] [place], [place] [address] [phone]

## 2022-11-02 LAB — COMPREHENSIVE METABOLIC PANEL
ALT: 11 IU/L (ref 0–32)
Albumin: 4 g/dL (ref 3.9–4.9)
BUN/Creatinine Ratio: 15 (ref 12–28)
Bilirubin Total: 0.3 mg/dL (ref 0.0–1.2)
CO2: 26 mmol/L (ref 20–29)
Sodium: 132 mmol/L — ABNORMAL LOW (ref 134–144)
eGFR: 98 mL/min/{1.73_m2} (ref >59)

## 2022-11-02 LAB — CBC WITH DIFFERENTIAL/PLATELET
Basophils Absolute: 0 10*3/uL (ref 0.0–0.2)
Hematocrit: 35.1 % (ref 34.0–46.6)
Hemoglobin: 11.7 g/dL (ref 11.1–15.9)
Lymphocytes Absolute: 1.9 10*3/uL (ref 0.7–3.1)
MCHC: 33.3 g/dL (ref 31.5–35.7)
MCV: 91 fL (ref 79–97)
Monocytes: 13 %

## 2022-11-03 LAB — CBC WITH DIFFERENTIAL/PLATELET
Basos: 0 %
EOS (ABSOLUTE): 0.1 10*3/uL (ref 0.0–0.4)
Eos: 1 %
Immature Grans (Abs): 0 10*3/uL (ref 0.0–0.1)
Immature Granulocytes: 0 %
Lymphs: 21 %
MCH: 30.2 pg (ref 26.6–33.0)
Monocytes Absolute: 1.2 10*3/uL — ABNORMAL HIGH (ref 0.1–0.9)
Neutrophils Absolute: 5.7 10*3/uL (ref 1.4–7.0)
Neutrophils: 65 %
Platelets: 357 10*3/uL (ref 150–450)
RBC: 3.88 x10E6/uL (ref 3.77–5.28)
RDW: 12.5 % (ref 11.7–15.4)
WBC: 8.9 10*3/uL (ref 3.4–10.8)

## 2022-11-03 LAB — COMPREHENSIVE METABOLIC PANEL
AST: 15 IU/L (ref 0–40)
Albumin/Globulin Ratio: 1.5
Alkaline Phosphatase: 85 IU/L (ref 44–121)
BUN: 9 mg/dL (ref 8–27)
Calcium: 10.1 mg/dL (ref 8.7–10.3)
Chloride: 92 mmol/L — ABNORMAL LOW (ref 96–106)
Creatinine, Ser: 0.61 mg/dL (ref 0.57–1.00)
Globulin, Total: 2.7 g/dL (ref 1.5–4.5)
Glucose: 94 mg/dL (ref 70–99)
Potassium: 4.1 mmol/L (ref 3.5–5.2)
Total Protein: 6.7 g/dL (ref 6.0–8.5)

## 2022-11-03 LAB — LAMOTRIGINE LEVEL: Lamotrigine Lvl: 7.5 ug/mL (ref 2.0–20.0)

## 2022-11-04 ENCOUNTER — Ambulatory Visit: Payer: Medicare Other | Admitting: Neurology

## 2022-12-23 ENCOUNTER — Ambulatory Visit: Payer: BLUE CROSS/BLUE SHIELD | Admitting: Gastroenterology

## 2023-02-08 ENCOUNTER — Ambulatory Visit (INDEPENDENT_AMBULATORY_CARE_PROVIDER_SITE_OTHER): Payer: Medicare Other | Admitting: Family Medicine

## 2023-02-08 ENCOUNTER — Encounter: Payer: Self-pay | Admitting: Family Medicine

## 2023-02-08 VITALS — BP 154/76 | HR 78 | Temp 98.7°F | Ht 68.0 in | Wt 120.2 lb

## 2023-02-08 DIAGNOSIS — R634 Abnormal weight loss: Secondary | ICD-10-CM | POA: Diagnosis not present

## 2023-02-08 DIAGNOSIS — M791 Myalgia, unspecified site: Secondary | ICD-10-CM

## 2023-02-08 LAB — COMPREHENSIVE METABOLIC PANEL
ALT: 11 U/L (ref 0–35)
AST: 15 U/L (ref 0–37)
Albumin: 4.3 g/dL (ref 3.5–5.2)
Alkaline Phosphatase: 86 U/L (ref 39–117)
BUN: 14 mg/dL (ref 6–23)
CO2: 31 meq/L (ref 19–32)
Calcium: 11.3 mg/dL — ABNORMAL HIGH (ref 8.4–10.5)
Chloride: 90 meq/L — ABNORMAL LOW (ref 96–112)
Creatinine, Ser: 0.63 mg/dL (ref 0.40–1.20)
GFR: 91.82 mL/min (ref >60.00)
Glucose, Bld: 98 mg/dL (ref 70–99)
Potassium: 4.4 meq/L (ref 3.5–5.1)
Sodium: 130 meq/L — ABNORMAL LOW (ref 135–145)
Total Bilirubin: 0.5 mg/dL (ref 0.2–1.2)
Total Protein: 7.7 g/dL (ref 6.0–8.3)

## 2023-02-08 LAB — CBC WITH DIFFERENTIAL/PLATELET
Basophils Absolute: 0 10*3/uL (ref 0.0–0.1)
Basophils Relative: 0.4 % (ref 0.0–3.0)
Eosinophils Absolute: 0.1 10*3/uL (ref 0.0–0.7)
Eosinophils Relative: 0.5 % (ref 0.0–5.0)
HCT: 42.9 % (ref 36.0–46.0)
Hemoglobin: 13.8 g/dL (ref 12.0–15.0)
Lymphocytes Relative: 23 % (ref 12.0–46.0)
Lymphs Abs: 2.9 10*3/uL (ref 0.7–4.0)
MCHC: 32.2 g/dL (ref 30.0–36.0)
MCV: 93 fl (ref 78.0–100.0)
Monocytes Absolute: 1.4 10*3/uL — ABNORMAL HIGH (ref 0.1–1.0)
Monocytes Relative: 11.6 % (ref 3.0–12.0)
Neutro Abs: 8 10*3/uL — ABNORMAL HIGH (ref 1.4–7.7)
Neutrophils Relative %: 64.5 % (ref 43.0–77.0)
Platelets: 341 10*3/uL (ref 150.0–400.0)
RBC: 4.61 Mil/uL (ref 3.87–5.11)
RDW: 14.7 % (ref 11.5–15.5)
WBC: 12.4 10*3/uL — ABNORMAL HIGH (ref 4.0–10.5)

## 2023-02-08 LAB — C-REACTIVE PROTEIN: CRP: 1.6 mg/dL (ref 0.5–20.0)

## 2023-02-08 LAB — CK: Total CK: 66 U/L (ref 7–177)

## 2023-02-08 LAB — T3, FREE: T3, Free: 6.7 pg/mL — ABNORMAL HIGH (ref 2.3–4.2)

## 2023-02-08 LAB — T4, FREE: Free T4: 1.88 ng/dL — ABNORMAL HIGH (ref 0.60–1.60)

## 2023-02-08 LAB — TSH: TSH: 2.96 u[IU]/mL (ref 0.35–5.50)

## 2023-02-08 LAB — SEDIMENTATION RATE: Sed Rate: 43 mm/h — ABNORMAL HIGH (ref 0–30)

## 2023-02-08 NOTE — Patient Instructions (Addendum)
Restart cholesterol medication.   Please stop at the lab to have labs drawn.  Can apply topical voltaren gel four times a day.  May consider referral to [facility] further eval.

## 2023-02-08 NOTE — Progress Notes (Signed)
Patient ID: [name], female    DOB: 01/31/1956, 67 y.o.   MRN: [id number]  This visit was conducted in person.  BP (!) 154/76 (BP Location: Right Arm, Patient Position: Sitting, Cuff Size: Normal)   Pulse 78   Temp 98.7 F (37.1 C) (Temporal)   Ht 5\' 8"  (1.727 m)   Wt 120 lb 4 oz (54.5 kg)   SpO2 97%   BMI 18.28 kg/m    CC:  Chief Complaint  Patient presents with   Muscle Pain    Legs, Upper Arms/Shoulders    Subjective:   HPI: Candice Gray is a 67 y.o. female presenting on 02/08/2023 for Muscle Pain (Legs, Upper Arms/Shoulders)   She reports continued pain in legs now spread to arms.. worsening in last month.  Ongoing in last year.   Pain in biceps  Does have pain in shoulder and wrists as well... left worse than right.  No joint swelling or redness.  Occ random numbness in right hand.. last few minutes. Did temporarily batter when on prednisone for sinus issue.   No better with stopping atorvastatin x 6 weeks. Was having back pain lumbar.. now resolved... had mild degenerative changes in spine.   BP Readings from Last 3 Encounters:  02/08/23 (!) 154/76  11/01/22 135/83  10/06/22 120/70   Drinking boost, eating 2000 calories per day Wt Readings from Last 3 Encounters:  02/08/23 120 lb 4 oz (54.5 kg)  11/01/22 121 lb (54.9 kg)  10/06/22 120 lb (54.4 kg)      Has raynaud's and SIADH.       Relevant past medical, surgical, family and social history reviewed and updated as indicated. Interim medical history since our last visit reviewed. Allergies and medications reviewed and updated. Outpatient Medications Prior to Visit  Medication Sig Dispense Refill   amLODipine (NORVASC) 2.5 MG tablet TAKE 1 TABLET BY MOUTH TWICE DAILY 180 tablet 3   atorvastatin (LIPITOR) 10 MG tablet Take 1 tablet (10 mg total) by mouth every Monday, Wednesday, and Friday. 36 tablet 3   Biotin 1 MG CAPS Take 1 tablet by mouth daily.     Calcium-Vitamin D (CALTRATE  600 PLUS-VIT D PO) Take 1 tablet by mouth daily.     Cholecalciferol (VITAMIN D-3 PO) Take 1 capsule by mouth daily.     hydrocortisone 2.5 % cream Apply 1 application topically as needed.      lamoTRIgine (LAMICTAL) 100 MG tablet Take 1 tablet (100 mg total) by mouth 2 (two) times daily. 180 tablet 4   latanoprost (XALATAN) 0.005 % ophthalmic solution Place 1 drop into both eyes at bedtime.     metoprolol tartrate (LOPRESSOR) 25 MG tablet TAKE 1/2 TABLET BY MOUTH TWICE DAILY 90 tablet 3   vitamin C (ASCORBIC ACID) 500 MG tablet Take 500 mg by mouth daily.     zoledronic acid (RECLAST) 5 MG/100ML SOLN injection Inject 5 mg into the vein once.     fexofenadine (ALLEGRA) 180 MG tablet Take 1 tablet (180 mg total) by mouth daily. 30 tablet 0   predniSONE (DELTASONE) 20 MG tablet 3 tabs by mouth daily x 3 days, then 2 tabs by mouth daily x 2 days then 1 tab by mouth daily x 2 days 15 tablet 0   No facility-administered medications prior to visit.     Per HPI unless specifically indicated in ROS section below Review of Systems  Constitutional:  Positive for fatigue. Negative for fever.  HENT:  Negative for congestion.   Eyes:  Negative for pain.  Respiratory:  Negative for cough and shortness of breath.   Cardiovascular:  Negative for chest pain, palpitations and leg swelling.  Gastrointestinal:  Negative for abdominal pain.  Genitourinary:  Negative for dysuria and vaginal bleeding.  Musculoskeletal:  Positive for arthralgias. Negative for back pain.  Neurological:  Negative for syncope, light-headedness and headaches.  Psychiatric/Behavioral:  Negative for dysphoric mood.    Objective:  BP (!) 154/76 (BP Location: Right Arm, Patient Position: Sitting, Cuff Size: Normal)   Pulse 78   Temp 98.7 F (37.1 C) (Temporal)   Ht 5\' 8"  (1.727 m)   Wt 120 lb 4 oz (54.5 kg)   SpO2 97%   BMI 18.28 kg/m   Wt Readings from Last 3 Encounters:  02/08/23 120 lb 4 oz (54.5 kg)  11/01/22 121 lb (54.9  kg)  10/06/22 120 lb (54.4 kg)      Physical Exam Constitutional:      General: She is not in acute distress.    Appearance: Normal appearance. She is well-developed. She is not ill-appearing or toxic-appearing.  HENT:     Head: Normocephalic.     Right Ear: Hearing, tympanic membrane, ear canal and external ear normal. Tympanic membrane is not erythematous, retracted or bulging.     Left Ear: Hearing, tympanic membrane, ear canal and external ear normal. Tympanic membrane is not erythematous, retracted or bulging.     Nose: No mucosal edema or rhinorrhea.     Right Sinus: No maxillary sinus tenderness or frontal sinus tenderness.     Left Sinus: No maxillary sinus tenderness or frontal sinus tenderness.     Mouth/Throat:     Mouth: Oropharynx is clear and moist and mucous membranes are normal.     Pharynx: Uvula midline.  Eyes:     General: Lids are normal. Lids are everted, no foreign bodies appreciated.     Extraocular Movements: EOM normal.     Conjunctiva/sclera: Conjunctivae normal.     Pupils: Pupils are equal, round, and reactive to light.  Neck:     Thyroid: No thyroid mass or thyromegaly.     Vascular: No carotid bruit.     Trachea: Trachea normal.  Cardiovascular:     Rate and Rhythm: Normal rate and regular rhythm.     Pulses: Normal pulses.     Heart sounds: Normal heart sounds, S1 normal and S2 normal. No murmur heard.    No friction rub. No gallop.  Pulmonary:     Effort: Pulmonary effort is normal. No tachypnea or respiratory distress.     Breath sounds: Normal breath sounds. No decreased breath sounds, wheezing, rhonchi or rales.  Abdominal:     General: Bowel sounds are normal.     Palpations: Abdomen is soft.     Tenderness: There is no abdominal tenderness.  Musculoskeletal:     Cervical back: Normal range of motion and neck supple.  Skin:    General: Skin is warm, dry and intact.     Findings: No rash.  Neurological:     Mental Status: She is alert.   Psychiatric:        Mood and Affect: Mood is not anxious or depressed.        Speech: Speech normal.        Behavior: Behavior normal. Behavior is cooperative.        Thought Content: Thought content normal.  Cognition and Memory: Cognition and memory normal.        Judgment: Judgment normal.       Results for orders placed or performed in visit on 02/08/23  Comprehensive metabolic panel  Result Value Ref Range   Sodium 130 (L) 135 - 145 mEq/L   Potassium 4.4 3.5 - 5.1 mEq/L   Chloride 90 (L) 96 - 112 mEq/L   CO2 31 19 - 32 mEq/L   Glucose, Bld 98 70 - 99 mg/dL   BUN 14 6 - 23 mg/dL   Creatinine, Ser 1.91 0.40 - 1.20 mg/dL   Total Bilirubin 0.5 0.2 - 1.2 mg/dL   Alkaline Phosphatase 86 39 - 117 U/L   AST 15 0 - 37 U/L   ALT 11 0 - 35 U/L   Total Protein 7.7 6.0 - 8.3 g/dL   Albumin 4.3 3.5 - 5.2 g/dL   GFR 47.82 >95.62 mL/min   Calcium 11.3 (H) 8.4 - 10.5 mg/dL  CBC with Differential/Platelet  Result Value Ref Range   WBC 12.4 (H) 4.0 - 10.5 K/uL   RBC 4.61 3.87 - 5.11 Mil/uL   Hemoglobin 13.8 12.0 - 15.0 g/dL   HCT 13.0 86.5 - 78.4 %   MCV 93.0 78.0 - 100.0 fl   MCHC 32.2 30.0 - 36.0 g/dL   RDW 69.6 29.5 - 28.4 %   Platelets 341.0 150.0 - 400.0 K/uL   Neutrophils Relative % 64.5 43.0 - 77.0 %   Lymphocytes Relative 23.0 12.0 - 46.0 %   Monocytes Relative 11.6 3.0 - 12.0 %   Eosinophils Relative 0.5 0.0 - 5.0 %   Basophils Relative 0.4 0.0 - 3.0 %   Neutro Abs 8.0 (H) 1.4 - 7.7 K/uL   Lymphs Abs 2.9 0.7 - 4.0 K/uL   Monocytes Absolute 1.4 (H) 0.1 - 1.0 K/uL   Eosinophils Absolute 0.1 0.0 - 0.7 K/uL   Basophils Absolute 0.0 0.0 - 0.1 K/uL  TSH  Result Value Ref Range   TSH 2.96 0.35 - 5.50 uIU/mL  T4, free  Result Value Ref Range   Free T4 1.88 (H) 0.60 - 1.60 ng/dL  T3, free  Result Value Ref Range   T3, Free 6.7 (H) 2.3 - 4.2 pg/mL  CK  Result Value Ref Range   Total CK 66 7 - 177 U/L  ANA  Result Value Ref Range   Anti Nuclear Antibody (ANA)  POSITIVE (A) NEGATIVE  C-reactive protein  Result Value Ref Range   CRP 1.6 0.5 - 20.0 mg/dL  Sedimentation rate  Result Value Ref Range   Sed Rate 43 (H) 0 - 30 mm/hr  Anti-nuclear ab-titer (ANA titer)  Result Value Ref Range   ANA Titer 1 1:40 (H) titer   ANA Pattern 1 Cytoplasmic (A)     Assessment and Plan  Myalgia Assessment & Plan: Acute, unclear etiology.  Will begin assessment with lab evaluation including thyroid, CK, autoimmune workup, complete metabolic panel and CBC.  If no clear cause determined can consider orthopedic referral for further evaluation.  Orders: -     Comprehensive metabolic panel -     CBC with Differential/Platelet -     TSH -     T4, free -     T3, free -     CK -     ANA -     C-reactive protein -     Sedimentation rate  Weight loss, abnormal Assessment & Plan: Acute, unclear etiology.  Will begin  assessment with labs.  Orders: -     Comprehensive metabolic panel -     CBC with Differential/Platelet -     TSH -     T4, free -     T3, free -     CK -     ANA -     C-reactive protein -     Sedimentation rate  Other orders -     Anti-nuclear ab-titer (ANA titer)    No follow-ups on file.   Kerby Nora, MD

## 2023-02-09 ENCOUNTER — Encounter: Payer: Self-pay | Admitting: Family Medicine

## 2023-02-10 LAB — ANA: Anti Nuclear Antibody (ANA): POSITIVE — AB

## 2023-02-10 LAB — ANTI-NUCLEAR AB-TITER (ANA TITER): ANA Titer 1: 1:40 {titer} — ABNORMAL HIGH

## 2023-02-15 DIAGNOSIS — R634 Abnormal weight loss: Secondary | ICD-10-CM | POA: Insufficient documentation

## 2023-02-15 DIAGNOSIS — M791 Myalgia, unspecified site: Secondary | ICD-10-CM | POA: Insufficient documentation

## 2023-02-15 NOTE — Assessment & Plan Note (Signed)
Acute, unclear etiology.  Will begin assessment with labs.

## 2023-02-15 NOTE — Assessment & Plan Note (Signed)
Acute, unclear etiology.  Will begin assessment with lab evaluation including thyroid, CK, autoimmune workup, complete metabolic panel and CBC.  If no clear cause determined can consider orthopedic referral for further evaluation.

## 2023-03-22 ENCOUNTER — Other Ambulatory Visit (HOSPITAL_COMMUNITY): Payer: Self-pay | Admitting: Surgery

## 2023-03-22 ENCOUNTER — Encounter: Payer: Self-pay | Admitting: Family Medicine

## 2023-03-22 DIAGNOSIS — E210 Primary hyperparathyroidism: Secondary | ICD-10-CM

## 2023-03-22 LAB — HM MAMMOGRAPHY

## 2023-03-30 ENCOUNTER — Encounter (HOSPITAL_COMMUNITY)
Admission: RE | Admit: 2023-03-30 | Discharge: 2023-03-30 | Disposition: A | Discharge status: Home / Self Care | Payer: Medicare Other | Source: Ambulatory Visit | Attending: Surgery | Admitting: Surgery

## 2023-03-30 ENCOUNTER — Ambulatory Visit (HOSPITAL_COMMUNITY)
Admission: RE | Admit: 2023-03-30 | Discharge: 2023-03-30 | Disposition: A | Discharge status: Home / Self Care | Payer: Medicare Other | Source: Ambulatory Visit | Attending: Surgery | Admitting: Surgery

## 2023-03-30 DIAGNOSIS — E210 Primary hyperparathyroidism: Secondary | ICD-10-CM | POA: Insufficient documentation

## 2023-03-30 MED ORDER — TECHNETIUM TC 99M SESTAMIBI - CARDIOLITE
27.5000 | Freq: Once | INTRAVENOUS | Status: AC
Start: 2023-03-30 — End: 2023-03-30
  Administered 2023-03-30: 27.5 via INTRAVENOUS

## 2023-03-31 ENCOUNTER — Telehealth: Payer: Self-pay | Admitting: *Deleted

## 2023-03-31 DIAGNOSIS — E785 Hyperlipidemia, unspecified: Secondary | ICD-10-CM

## 2023-03-31 DIAGNOSIS — R7303 Prediabetes: Secondary | ICD-10-CM

## 2023-03-31 NOTE — Telephone Encounter (Signed)
-----   Message from Lovena Neighbours sent at 03/31/2023  2:02 PM EST ----- Regarding: Labs for Monday 11.25.24 Please put fasting physical lab orders in future. Thank you, Denny Peon

## 2023-04-07 ENCOUNTER — Other Ambulatory Visit: Payer: Self-pay | Admitting: Family Medicine

## 2023-04-08 ENCOUNTER — Encounter: Payer: Self-pay | Admitting: Gastroenterology

## 2023-04-08 ENCOUNTER — Ambulatory Visit (INDEPENDENT_AMBULATORY_CARE_PROVIDER_SITE_OTHER): Payer: Medicare Other | Admitting: Gastroenterology

## 2023-04-08 VITALS — BP 130/74 | HR 77 | Ht 68.0 in | Wt 124.1 lb

## 2023-04-08 DIAGNOSIS — E222 Syndrome of inappropriate secretion of antidiuretic hormone: Secondary | ICD-10-CM

## 2023-04-08 DIAGNOSIS — E215 Disorder of parathyroid gland, unspecified: Secondary | ICD-10-CM | POA: Diagnosis not present

## 2023-04-08 DIAGNOSIS — Z1211 Encounter for screening for malignant neoplasm of colon: Secondary | ICD-10-CM

## 2023-04-08 DIAGNOSIS — K5730 Diverticulosis of large intestine without perforation or abscess without bleeding: Secondary | ICD-10-CM

## 2023-04-08 DIAGNOSIS — K5790 Diverticulosis of intestine, part unspecified, without perforation or abscess without bleeding: Secondary | ICD-10-CM | POA: Diagnosis not present

## 2023-04-08 NOTE — Progress Notes (Signed)
Candice Gray    [id number]    10-Jan-1956  Primary Care Physician:Bedsole, Luberta Robertson, MD  Referring Physician: Excell Seltzer, MD [address] [place],  [place] [address]   Chief complaint:  colorectal cancer screening Discussed the use of AI scribe software for clinical note transcription with the patient, who gave verbal consent to proceed.  History of Present Illness   The patient, with a history of diverticulosis and SIADH, presents for a routine follow-up. She reports a stable weight and regular bowel habits with no bleeding. She also mentions a recent diagnosis of a parathyroid problem, for which she is awaiting surgery.  The patient has been experiencing significant stress due to a recent incident where her husband was attacked by a neighbor's dog. This has caused considerable anxiety and fear, as the dog is due to be returned to the neighbor.  Regarding her diverticulosis, the patient has been adhering to a regimen of daily Benefiber and a fluid restriction of 64 ounces due to her SIADH. She reports no abdominal pain, except for a slight discomfort in the area of her known diverticulosis during the examination.  The patient has reservations about undergoing a colonoscopy due to a previous negative experience. She expresses interest in using the Cologuard test as an alternative, understanding its limitations in detecting polyps. She has used this method previously and is comfortable with its use.          Outpatient Encounter Medications as of 04/08/2023  Medication Sig   amLODipine (NORVASC) 2.5 MG tablet TAKE 1 TABLET BY MOUTH TWICE DAILY   atorvastatin (LIPITOR) 10 MG tablet Take 1 tablet (10 mg total) by mouth every Monday, Wednesday, and Friday.   hydrocortisone 2.5 % cream Apply 1 application topically as needed.    lamoTRIgine (LAMICTAL) 100 MG tablet Take 1 tablet (100 mg total) by mouth 2 (two) times daily.   latanoprost (XALATAN) 0.005 %  ophthalmic solution Place 1 drop into both eyes at bedtime.   metoprolol tartrate (LOPRESSOR) 25 MG tablet TAKE 1/2 TABLET BY MOUTH TWICE DAILY   vitamin C (ASCORBIC ACID) 500 MG tablet Take 500 mg by mouth daily.   zoledronic acid (RECLAST) 5 MG/100ML SOLN injection Inject 5 mg into the vein once.   [DISCONTINUED] Biotin 1 MG CAPS Take 1 tablet by mouth daily.   [DISCONTINUED] Calcium-Vitamin D (CALTRATE 600 PLUS-VIT D PO) Take 1 tablet by mouth daily.   [DISCONTINUED] Cholecalciferol (VITAMIN D-3 PO) Take 1 capsule by mouth daily.   No facility-administered encounter medications on file as of 04/08/2023.    Allergies as of 04/08/2023 - Review Complete 04/08/2023  Allergen Reaction Noted   Azelastine hcl     Depo-medrol [methylprednisolone] Itching 03/25/2017   Miralax [polyethylene glycol] Itching 01/09/2013   Red dye #40 (allura red) Other (See Comments) 01/09/2013   Zyban [bupropion] Swelling 08/11/2013    Past Medical History:  Diagnosis Date   Anxiety    Dermatitis, seborrheic 2018   Deviated nasal septum    Diverticulitis    Diverticulosis    HTN (hypertension)    Hyperlipemia    Hyperplastic colon polyp    Hyponatremia    Hyponatremia    MVP (mitral valve prolapse)    Parathyroid abnormality (HCC)    Raynaud phenomenon    Seizures (HCC)    SIADH (syndrome of inappropriate ADH production) (HCC)     Past Surgical History:  Procedure Laterality Date  ABDOMINAL HYSTERECTOMY     CATARACT EXTRACTION, BILATERAL  2019   CRYOTHERAPY     for cervical dysplasia   NM MYOCAR PERF WALL MOTION  11/2007   bruce myoview; perfusion defect in anterior myocardium (breast attenuation); post-stress EF 77%; normal, low risk study    TRANSTHORACIC ECHOCARDIOGRAM  11/2007   EF=>55%; mild MR; trace TR   TUBAL LIGATION      Family History  Problem Relation Age of Onset   Hyperlipidemia Father    Hypertension Father    Diabetes Father    Atrial fibrillation Father    Diabetes  Paternal Grandmother    Cancer Paternal Grandfather        brain tumor   Hyperlipidemia Other    Hypertension Other    Coronary artery disease Maternal Grandmother    Diabetes Maternal Grandmother    Hyperlipidemia Brother    Transient ischemic attack Mother    Dementia Mother    Colon cancer Neg Hx     Social History   Socioeconomic History   Marital status: Married    Spouse name: Casimiro Needle   Number of children: 2   Years of education: [employer] education level: Bachelor's degree (e.g., BA, AB, BS)  Occupational History   Occupation: retired    Associate Professor: UNEMPLOYED  Tobacco Use   Smoking status: Former    Current packs/day: 0.00    Types: Cigarettes    Quit date: 06/05/1998    Years since quitting: 24.9   Smokeless tobacco: Never  Substance and Sexual Activity   Alcohol use: Yes    Alcohol/week: 4.0 standard drinks of alcohol    Types: 4 Cans of beer per week    Comment: daily   Drug use: Yes    Types: Marijuana    Comment: 04/11/18 yes marijuana   Sexual activity: Not on file  Other Topics Concern   Not on file  Social History Narrative   Lives at husband and 2 sons   Right-handed   Regular exercise--yes   Caffeine: daily coffee   Diet: fruits and veggies   Social Determinants of Health   Financial Resource Strain: Low Risk  (10/05/2022)   Overall Financial Resource Strain (CARDIA)    Difficulty of Paying Living Expenses: Not hard at all  Food Insecurity: No Food Insecurity (10/05/2022)   Hunger Vital Sign    Worried About Running Out of Food in the Last Year: Never true    Ran Out of Food in the Last Year: Never true  Transportation Needs: No Transportation Needs (10/05/2022)   PRAPARE - Administrator, Civil Service (Medical): No    Lack of Transportation (Non-Medical): No  Physical Activity: Unknown (10/05/2022)   Exercise Vital Sign    Days of Exercise per Week: Patient declined    Minutes of Exercise per Session: Not on file   Stress: No Stress Concern Present (10/05/2022)   [employer] of Occupational Health - Occupational Stress Questionnaire    Feeling of Stress : Not at all  Social Connections: Unknown (10/05/2022)   Social Connection and Isolation Panel [NHANES]    Frequency of Communication with Friends and Family: Once a week    Frequency of Social Gatherings with Friends and Family: Patient declined    Attends Religious Services: Never    Database administrator or Organizations: No    Attends Engineer, structural: Not on file    Marital Status: Married  Catering manager Violence: Not At Risk (  04/19/2022)   Humiliation, Afraid, Rape, and Kick questionnaire    Fear of Current or Ex-Partner: No    Emotionally Abused: No    Physically Abused: No    Sexually Abused: No      Review of systems: All other review of systems negative except as mentioned in the HPI.   Physical Exam: Vitals:   04/08/23 1057  BP: 130/74  Pulse: 77  SpO2: 96%   Body mass index is 18.87 kg/m. Gen:      No acute distress HEENT:  sclera anicteric Abd:      soft, non-tender; no palpable masses, no distension Ext:    No edema Neuro: alert and oriented x 3 Psych: normal mood and affect  Data Reviewed:  Reviewed labs, radiology imaging, old records and pertinent past GI work up  Results   PATHOLOGY Parathyroid: Parathyroid pathology identified       Assessment and Plan/Recommendations: 67 year old very pleasant female with history of hypertension, hyperlipidemia, SIADH, hyponatremia, seizure disorder and severe diverticulosis with history of diverticulitis here to discuss colorectal cancer screening      Colon Cancer Screening Patient has a history of difficult colonoscopy and prefers non-invasive screening. Discussed the limitations of Cologuard (90% sensitivity for cancer, lower for polyps). -Order Cologuard test. If positive, patient understands the need for  colonoscopy.  Diverticulosis Patient reports mild abdominal discomfort. No change in bowel habits or bleeding. -Continue Benefiber daily and maintain adequate hydration within the limits of her fluid restriction for SIADH.  Parathyroid Disease Patient reports upcoming surgery for parathyroid disease. -No specific plan discussed in this conversation.  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 -Continue current management.  General Health Maintenance / Followup Plans -Follow up in 3 years for repeat colon cancer screening, sooner if there are any changes in bowel habits or if Cologuard test is positive.       The patient was provided an opportunity to ask questions and all were answered. The patient agreed with the plan and demonstrated an understanding of the instructions.  Iona Beard , MD    CC: Excell Seltzer, MD

## 2023-04-08 NOTE — Patient Instructions (Addendum)
VISIT SUMMARY:  During your visit today, we discussed your ongoing health concerns, including your diverticulosis, SIADH, and recent parathyroid diagnosis. We also addressed your stress and anxiety related to a recent incident involving your husband. We reviewed your current management plan and made some adjustments to your screening tests.  YOUR PLAN:  -COLON CANCER SCREENING: Colon cancer screening is important for early detection of cancer. Given your history of a difficult colonoscopy, we discussed using the Cologuard test as an alternative. This test has a 90% sensitivity for detecting cancer but is less effective at detecting polyps. We will order the Cologuard test, and if it is positive, you will need to undergo a colonoscopy.  -DIVERTICULOSIS: Diverticulosis is a condition where small pouches form in the walls of your colon. You reported mild abdominal discomfort but no changes in bowel habits or bleeding. Continue taking Benefiber daily and maintain adequate hydration within your fluid restriction limits.  -PARATHYROID DISEASE: Parathyroid disease involves abnormal function of the parathyroid glands, which can affect calcium levels in your body. You mentioned an upcoming surgery for this condition. No specific plan was discussed today.  -SYNDROME OF INAPPROPRIATE ANTIDIURETIC HORMONE SECRETION (SIADH): SIADH is a condition where your body makes too much antidiuretic hormone, leading to water retention and low sodium levels. Continue with your current fluid restriction of 64 ounces per day.  INSTRUCTIONS:  Please follow up in 3 years for repeat colon cancer screening, or sooner if there are any changes in your bowel habits or if the Cologuard test is positive.  Your provider has ordered Cologuard testing as an option for colon cancer screening. This is performed by [facility] and may be out of network with your insurance. PRIOR to completing the test, it is YOUR  responsibility to contact your insurance about covered benefits for this test. Your out of pocket expense could be anywhere from $0.00 to $649.00.   When you call to check coverage with your insurer, please provide the following information:   -The ONLY provider of Cologuard is Opt[facility], dispensing  - CPT code for Cologuard is [phone].  [facility] NPI # [id number]  -[employer] Tax ID # [id number]   We have already sent your demographic and insurance information to [facility] (phone number [phone]) and they should contact you within the next week regarding your test. If you have not heard from them within the next week, please call our office at [phone].  I appreciate the  opportunity to care for you  Thank You   Marsa Aris , MD

## 2023-04-14 NOTE — Progress Notes (Signed)
Noted  

## 2023-04-14 NOTE — Progress Notes (Signed)
Thyroid USN is normal.  Awaiting the radiologist to read the nuclear study.  tmg  Darnell Level, MD [facility] practice Office: [phone]

## 2023-04-15 ENCOUNTER — Other Ambulatory Visit: Payer: Self-pay | Admitting: Surgery

## 2023-04-15 DIAGNOSIS — E213 Hyperparathyroidism, unspecified: Secondary | ICD-10-CM

## 2023-04-15 NOTE — Progress Notes (Signed)
USN is negative for adenoma.  Sestamibi scan has a faint signal but is not definitive.  Alisha - please contact patient and order a 4D-CT scan of the neck with parathyroid protocol to see if they can localize an adenoma.  I will contact the patient with the results when they are available.  tmg  Darnell Level, MD [facility] practice Office: [phone]

## 2023-04-18 ENCOUNTER — Other Ambulatory Visit (INDEPENDENT_AMBULATORY_CARE_PROVIDER_SITE_OTHER): Payer: Medicare Other

## 2023-04-18 DIAGNOSIS — R7303 Prediabetes: Secondary | ICD-10-CM

## 2023-04-18 DIAGNOSIS — E785 Hyperlipidemia, unspecified: Secondary | ICD-10-CM

## 2023-04-18 LAB — LIPID PANEL
Cholesterol: 202 mg/dL — ABNORMAL HIGH (ref 0–200)
HDL: 100.1 mg/dL (ref >39.00)
LDL Cholesterol: 90 mg/dL (ref 0–99)
NonHDL: 101.49
Total CHOL/HDL Ratio: 2
Triglycerides: 55 mg/dL (ref 0.0–149.0)
VLDL: 11 mg/dL (ref 0.0–40.0)

## 2023-04-18 LAB — HEMOGLOBIN A1C: Hgb A1c MFr Bld: 6.3 % (ref 4.6–6.5)

## 2023-04-18 NOTE — Progress Notes (Signed)
No critical labs need to be addressed urgently. We will discuss labs in detail at upcoming office visit.

## 2023-04-22 ENCOUNTER — Other Ambulatory Visit: Payer: BLUE CROSS/BLUE SHIELD

## 2023-04-25 ENCOUNTER — Encounter: Payer: Self-pay | Admitting: Gastroenterology

## 2023-04-27 ENCOUNTER — Encounter: Payer: Self-pay | Admitting: Gastroenterology

## 2023-04-27 ENCOUNTER — Ambulatory Visit: Payer: Medicare Other

## 2023-04-27 VITALS — Ht 68.0 in | Wt 120.0 lb

## 2023-04-27 DIAGNOSIS — Z0000 Encounter for general adult medical examination without abnormal findings: Secondary | ICD-10-CM | POA: Diagnosis not present

## 2023-04-27 LAB — COLOGUARD: COLOGUARD: POSITIVE — AB

## 2023-04-27 NOTE — Patient Instructions (Signed)
Candice Gray , Thank you for taking time to come for your Medicare Wellness Visit. I appreciate your ongoing commitment to your health goals. Please review the following plan we discussed and let me know if I can assist you in the future.   Referrals/Orders/Follow-Ups/Clinician Recommendations: none  This is a list of the screening recommended for you and due dates:  Health Maintenance  Topic Date Due   Zoster (Shingles) Vaccine (1 of 2) 05/10/2023*   Flu Shot  08/22/2023*   Mammogram  03/21/2024   Medicare Annual Wellness Visit  04/26/2024   DEXA scan (bone density measurement)  09/05/2024   Cologuard (Stool DNA test)  04/18/2026   DTaP/Tdap/Td vaccine (3 - Td or Tdap) 04/02/2029   Pneumonia Vaccine  Completed   Hepatitis C Screening  Completed   HPV Vaccine  Aged Out   Colon Cancer Screening  Discontinued   COVID-19 Vaccine  Discontinued  *Topic was postponed. The date shown is not the original due date.    Advanced directives: (Copy Requested) Please bring a copy of your health care power of attorney and living will to the office to be added to your chart at your convenience.  Next Medicare Annual Wellness Visit scheduled for next year: Yes 04/27/24 @ 8:10am telephone

## 2023-04-27 NOTE — Progress Notes (Signed)
Subjective:   Candice Gray is a 67 y.o. female who presents for Medicare Annual (Subsequent) preventive examination.  Visit Complete: Virtual I connected with  [name] on 04/27/23 by a audio enabled telemedicine application and verified that I am speaking with the correct person using two identifiers.  Patient Location: Home  Provider Location: Office/Clinic  I discussed the limitations of evaluation and management by telemedicine. The patient expressed understanding and agreed to proceed.  Vital Signs: Because this visit was a virtual/telehealth visit, some criteria may be missing or patient reported. Any vitals not documented were not able to be obtained and vitals that have been documented are patient reported.  Patient Medicare AWV questionnaire was completed by the patient on 04/25/23; I have confirmed that all information answered by patient is correct and no changes since this date. Cardiac Risk Factors include: advanced age (>32men, >54 women);dyslipidemia;hypertension    Objective:    Today's Vitals   04/27/23 0850  Weight: 120 lb (54.4 kg)  Height: 5\' 8"  (1.727 m)   Body mass index is 18.25 kg/m.     04/27/2023    9:07 AM 04/19/2022    8:49 AM 03/25/2016   11:06 AM 08/11/2013    4:11 PM  Advanced Directives  Does Patient Have a Medical Advance Directive? Yes Yes No Patient does not have advance directive  Type of Advance Directive Healthcare Power of [place];Living will Healthcare Power of [place];Living will    Copy of Healthcare Power of Attorney in Chart? No - copy requested No - copy requested    Would patient like information on creating a medical advance directive?   No - patient declined information   Pre-existing out of facility DNR order (yellow form or pink MOST form)    No    Current Medications (verified) Outpatient Encounter Medications as of 04/27/2023  Medication Sig   amLODipine (NORVASC) 2.5 MG tablet TAKE 1 TABLET BY MOUTH  TWICE DAILY   atorvastatin (LIPITOR) 10 MG tablet Take 1 tablet (10 mg total) by mouth every Monday, Wednesday, and Friday.   hydrocortisone 2.5 % cream Apply 1 application topically as needed.    lamoTRIgine (LAMICTAL) 100 MG tablet Take 1 tablet (100 mg total) by mouth 2 (two) times daily.   latanoprost (XALATAN) 0.005 % ophthalmic solution Place 1 drop into both eyes at bedtime.   metoprolol tartrate (LOPRESSOR) 25 MG tablet TAKE 1/2 TABLET BY MOUTH TWICE DAILY   zoledronic acid (RECLAST) 5 MG/100ML SOLN injection Inject 5 mg into the vein once.   vitamin C (ASCORBIC ACID) 500 MG tablet Take 500 mg by mouth daily.   No facility-administered encounter medications on file as of 04/27/2023.    Allergies (verified) Azelastine hcl, Depo-medrol [methylprednisolone], Miralax [polyethylene glycol], Red dye #40 (allura red), and Zyban [bupropion]   History: Past Medical History:  Diagnosis Date   Anxiety    Dermatitis, seborrheic 2018   Deviated nasal septum    Diverticulitis    Diverticulosis    HTN (hypertension)    Hyperlipemia    Hyperplastic colon polyp    Hyponatremia    Hyponatremia    MVP (mitral valve prolapse)    Parathyroid abnormality (HCC)    Raynaud phenomenon    Seizures (HCC)    SIADH (syndrome of inappropriate ADH production) (HCC)    Past Surgical History:  Procedure Laterality Date   ABDOMINAL HYSTERECTOMY     CATARACT EXTRACTION, BILATERAL  2019   CRYOTHERAPY  for cervical dysplasia   NM MYOCAR PERF WALL MOTION  11/2007   bruce myoview; perfusion defect in anterior myocardium (breast attenuation); post-stress EF 77%; normal, low risk study    TRANSTHORACIC ECHOCARDIOGRAM  11/2007   EF=>55%; mild MR; trace TR   TUBAL LIGATION     Family History  Problem Relation Age of Onset   Hyperlipidemia Father    Hypertension Father    Diabetes Father    Atrial fibrillation Father    Diabetes Paternal Grandmother    Cancer Paternal Grandfather        brain tumor    Hyperlipidemia Other    Hypertension Other    Coronary artery disease Maternal Grandmother    Diabetes Maternal Grandmother    Hyperlipidemia Brother    Transient ischemic attack Mother    Dementia Mother    Colon cancer Neg Hx    Social History   Socioeconomic History   Marital status: Married    Spouse name: Casimiro Needle   Number of children: 2   Years of education: [employer] education level: Bachelor's degree (e.g., BA, AB, BS)  Occupational History   Occupation: retired    Associate Professor: UNEMPLOYED  Tobacco Use   Smoking status: Former    Current packs/day: 0.00    Types: Cigarettes    Quit date: 06/05/1998    Years since quitting: 24.9   Smokeless tobacco: Never  Substance and Sexual Activity   Alcohol use: Yes    Alcohol/week: 4.0 standard drinks of alcohol    Types: 4 Cans of beer per week    Comment: daily   Drug use: Yes    Types: Marijuana    Comment: 04/11/18 yes marijuana   Sexual activity: Not on file  Other Topics Concern   Not on file  Social History Narrative   Lives at husband and 2 sons   Right-handed   Regular exercise--yes   Caffeine: daily coffee   Diet: fruits and veggies   Social Determinants of Health   Financial Resource Strain: Low Risk  (04/25/2023)   Overall Financial Resource Strain (CARDIA)    Difficulty of Paying Living Expenses: Not hard at all  Food Insecurity: No Food Insecurity (04/25/2023)   Hunger Vital Sign    Worried About Running Out of Food in the Last Year: Never true    Ran Out of Food in the Last Year: Never true  Transportation Needs: No Transportation Needs (04/25/2023)   PRAPARE - Administrator, Civil Service (Medical): No    Lack of Transportation (Non-Medical): No  Physical Activity: Insufficiently Active (04/25/2023)   Exercise Vital Sign    Days of Exercise per Week: 4 days    Minutes of Exercise per Session: 20 min  Stress: No Stress Concern Present (04/25/2023)   [employer] of  Occupational Health - Occupational Stress Questionnaire    Feeling of Stress : Not at all  Social Connections: Unknown (04/25/2023)   Social Connection and Isolation Panel [NHANES]    Frequency of Communication with Friends and Family: Twice a week    Frequency of Social Gatherings with Friends and Family: Patient declined    Attends Religious Services: Never    Database administrator or Organizations: No    Attends Engineer, structural: Never    Marital Status: Married    Tobacco Counseling Counseling given: Not Answered   Clinical Intake:  Pre-visit preparation completed: No  Pain : No/denies pain     BMI -  recorded: 18.25 Nutritional Status: BMI <19  Underweight Nutritional Risks: None Diabetes: No  How often do you need to have someone help you when you read instructions, pamphlets, or other written materials from your doctor or pharmacy?: 1 - Never  Interpreter Needed?: No  Comments: lives with husband Information entered by :: B.Arin Vanosdol,LPN   Activities of Daily Living    04/25/2023    5:05 PM  In your present state of health, do you have any difficulty performing the following activities:  Hearing? 0  Vision? 0  Difficulty concentrating or making decisions? 0  Walking or climbing stairs? 0  Dressing or bathing? 0  Doing errands, shopping? 0  Preparing Food and eating ? N  Using the Toilet? N  In the past six months, have you accidently leaked urine? N  Do you have problems with loss of bowel control? N  Managing your Medications? N  Managing your Finances? N  Housekeeping or managing your Housekeeping? N    Patient Care Team: Excell Seltzer, MD as PCP - General  Indicate any recent Medical Services you may have received from other than [facility] providers in the past year (date may be approximate).     Assessment:   This is a routine wellness examination for Candice Gray.  Hearing/Vision screen Hearing Screening - Comments:: Pt says her hearing  is good Vision Screening - Comments:: Pt says her vision is good after cataract surgery [facility] -Dr Senaida [place]   Goals Addressed             This Visit's Progress    Patient Stated       I would like to get the parathyroid problem resolved       Depression Screen    04/27/2023    8:56 AM 04/19/2022    8:47 AM 04/10/2021   11:18 AM 04/04/2020   10:21 AM 04/03/2019   10:16 AM 03/28/2018    9:21 AM 03/25/2017    9:08 AM  PHQ 2/9 Scores  PHQ - 2 Score 0 0 0 1 0 1 0  PHQ- 9 Score    1  3     Fall Risk    04/25/2023    5:05 PM 07/08/2022    9:14 AM 04/19/2022    8:49 AM 04/10/2021   11:17 AM 04/11/2018    9:54 AM  Fall Risk   Falls in the past year? 0 0 0 0 0  Number falls in past yr:  0 0    Injury with Fall?  0 0    Risk for fall due to : No Fall Risks No Fall Risks No Fall Risks    Follow up Falls prevention discussed;Education provided Falls evaluation completed Falls prevention discussed      MEDICARE RISK AT HOME: Medicare Risk at Home Any stairs in or around the home?: Yes If so, are there any without handrails?: No Home free of loose throw rugs in walkways, pet beds, electrical cords, etc?: Yes Adequate lighting in your home to reduce risk of falls?: Yes Life alert?: No Use of a cane, walker or w/c?: No Grab bars in the bathroom?: No Shower chair or bench in shower?: No Elevated toilet seat or a handicapped toilet?: No  TIMED UP AND GO:  Was the test performed?  No    Cognitive Function:        04/27/2023    8:58 AM 04/19/2022    8:50 AM  6CIT Screen  What Year? 0  points 0 points  What month? 0 points 0 points  What time? 0 points 0 points  Count back from 20 0 points 0 points  Months in reverse 0 points 0 points  Repeat phrase 0 points 0 points  Total Score 0 points 0 points    Immunizations Immunization History  Administered Date(s) Administered   Influenza, High Dose Seasonal PF 03/04/2021, 03/12/2022    Influenza,inj,Quad PF,6+ Mos 03/25/2017, 03/28/2018, 03/01/2019, 03/04/2020   PNEUMOCOCCAL CONJUGATE-20 04/10/2021   Pneumococcal Polysaccharide-23 04/14/2010   Td 11/01/2008   Tdap 04/03/2019    TDAP status: Up to date  Flu Vaccine status: Declined, Education has been provided regarding the importance of this vaccine but patient still declined. Advised may receive this vaccine at local pharmacy or Health Dept. Aware to provide a copy of the vaccination record if obtained from local pharmacy or Health Dept. Verbalized acceptance and understanding.  Pneumococcal vaccine status: Up to date  Covid-19 vaccine status: Declined, Education has been provided regarding the importance of this vaccine but patient still declined. Advised may receive this vaccine at local pharmacy or Health Dept.or vaccine clinic. Aware to provide a copy of the vaccination record if obtained from local pharmacy or Health Dept. Verbalized acceptance and understanding.  Qualifies for Shingles Vaccine? Yes   Zostavax completed No   Shingrix Completed?: No.    Education has been provided regarding the importance of this vaccine. Patient has been advised to call insurance company to determine out of pocket expense if they have not yet received this vaccine. Advised may also receive vaccine at local pharmacy or Health Dept. Verbalized acceptance and understanding.  Screening Tests Health Maintenance  Topic Date Due   Zoster Vaccines- Shingrix (1 of 2) 05/10/2023 (Originally 09/24/2005)   INFLUENZA VACCINE  08/22/2023 (Originally 12/23/2022)   MAMMOGRAM  03/21/2024   Medicare Annual Wellness (AWV)  04/26/2024   DEXA SCAN  09/05/2024   Fecal DNA (Cologuard)  04/18/2026   DTaP/Tdap/Td (3 - Td or Tdap) 04/02/2029   Pneumonia Vaccine 68+ Years old  Completed   Hepatitis C Screening  Completed   HPV VACCINES  Aged Out   Colonoscopy  Discontinued   COVID-19 Vaccine  Discontinued    Health Maintenance  There are no preventive  care reminders to display for this patient.   Colorectal cancer screening: Type of screening: Cologuard. Completed 04/19/2023. Repeat every 3 years  Mammogram status: Completed 03/22/23. Repeat every year  Bone Density status: Completed 09/06/22. Results reflect: Bone density results: OSTEOPOROSIS. Repeat every 3 years.  Lung Cancer Screening: (Low Dose CT Chest recommended if Age 48-80 years, 20 pack-year currently smoking OR have quit w/in 15years.) does not qualify.   Lung Cancer Screening Referral: no  Additional Screening:  Hepatitis C Screening: does not qualify; Completed 03/05/16  Vision Screening: Recommended annual ophthalmology exams for early detection of glaucoma and other disorders of the eye. Is the patient up to date with their annual eye exam?  Yes  Who is the provider or what is the name of the office in which the patient attends annual eye exams? Dr Redmond School If pt is not established with a provider, would they like to be referred to a provider to establish care? No .   Dental Screening: Recommended annual dental exams for proper oral hygiene  Diabetic Foot Exam: n/a  Community Resource Referral / Chronic Care Management: CRR required this visit?  No   CCM required this visit?  No    Plan:  I have personally reviewed and noted the following in the patient's chart:   Medical and social history Use of alcohol, tobacco or illicit drugs  Current medications and supplements including opioid prescriptions. Patient is not currently taking opioid prescriptions. Functional ability and status Nutritional status Physical activity Advanced directives List of other physicians Hospitalizations, surgeries, and ER visits in previous 12 months Vitals Screenings to include cognitive, depression, and falls Referrals and appointments  In addition, I have reviewed and discussed with patient certain preventive protocols, quality metrics, and best practice  recommendations. A written personalized care plan for preventive services as well as general preventive health recommendations were provided to patient.    Sue Lush, LPN   [id number]   After Visit Summary: (MyChart) Due to this being a telephonic visit, the after visit summary with patients personalized plan was offered to patient via MyChart   Nurse Notes: The patient states she is doing well and has no concerns or questions at this time.  *Pt does mention she awaiting results from Cologard results from test 04/19/23.*

## 2023-04-29 ENCOUNTER — Ambulatory Visit (INDEPENDENT_AMBULATORY_CARE_PROVIDER_SITE_OTHER): Payer: Medicare Other | Admitting: Family Medicine

## 2023-04-29 ENCOUNTER — Encounter: Payer: Self-pay | Admitting: Family Medicine

## 2023-04-29 VITALS — BP 130/80 | HR 74 | Temp 98.6°F | Ht 67.75 in | Wt 122.5 lb

## 2023-04-29 DIAGNOSIS — R569 Unspecified convulsions: Secondary | ICD-10-CM

## 2023-04-29 DIAGNOSIS — I700 Atherosclerosis of aorta: Secondary | ICD-10-CM

## 2023-04-29 DIAGNOSIS — E213 Hyperparathyroidism, unspecified: Secondary | ICD-10-CM | POA: Diagnosis not present

## 2023-04-29 DIAGNOSIS — R7303 Prediabetes: Secondary | ICD-10-CM

## 2023-04-29 DIAGNOSIS — E785 Hyperlipidemia, unspecified: Secondary | ICD-10-CM | POA: Diagnosis not present

## 2023-04-29 DIAGNOSIS — Z0000 Encounter for general adult medical examination without abnormal findings: Secondary | ICD-10-CM | POA: Diagnosis not present

## 2023-04-29 DIAGNOSIS — E222 Syndrome of inappropriate secretion of antidiuretic hormone: Secondary | ICD-10-CM

## 2023-04-29 DIAGNOSIS — M810 Age-related osteoporosis without current pathological fracture: Secondary | ICD-10-CM

## 2023-04-29 DIAGNOSIS — I10 Essential (primary) hypertension: Secondary | ICD-10-CM | POA: Diagnosis not present

## 2023-04-29 LAB — COMPREHENSIVE METABOLIC PANEL
ALT: 10 U/L (ref 0–35)
AST: 13 U/L (ref 0–37)
Albumin: 4.3 g/dL (ref 3.5–5.2)
Alkaline Phosphatase: 87 U/L (ref 39–117)
BUN: 12 mg/dL (ref 6–23)
CO2: 31 meq/L (ref 19–32)
Calcium: 9.9 mg/dL (ref 8.4–10.5)
Chloride: 91 meq/L — ABNORMAL LOW (ref 96–112)
Creatinine, Ser: 0.57 mg/dL (ref 0.40–1.20)
GFR: 93.92 mL/min (ref >60.00)
Glucose, Bld: 106 mg/dL — ABNORMAL HIGH (ref 70–99)
Potassium: 4.2 meq/L (ref 3.5–5.1)
Sodium: 131 meq/L — ABNORMAL LOW (ref 135–145)
Total Bilirubin: 0.5 mg/dL (ref 0.2–1.2)
Total Protein: 7.4 g/dL (ref 6.0–8.3)

## 2023-04-29 LAB — VITAMIN D 25 HYDROXY (VIT D DEFICIENCY, FRACTURES): VITD: 35.62 ng/mL (ref 30.00–100.00)

## 2023-04-29 NOTE — Assessment & Plan Note (Signed)
Chronic, no seizures on Lamictal 100 mg 2 times daily.  Followed by neurology.

## 2023-04-29 NOTE — Assessment & Plan Note (Signed)
Stable, chronic.  Continue current medication.  Amlodipine 2.5 mg p.o. daily Metoprolol 25 mg half tablet twice daily

## 2023-04-29 NOTE — Patient Instructions (Addendum)
Please stop at the lab to have labs drawn.  Work on low [employer] and regular exercise.

## 2023-04-29 NOTE — Assessment & Plan Note (Addendum)
per Dr. Gerrit Friends.Marland Kitchen US showed now specific adenoma  Has upcoming CT parathyroid

## 2023-04-29 NOTE — Assessment & Plan Note (Signed)
Chronic, increased risk for fracture.   On Reclast per Endo

## 2023-04-29 NOTE — Assessment & Plan Note (Signed)
Chronic, slight worsening in the last year.  Discussed decreasing carbohydrates without further weight loss.  Increase protein source for calories.

## 2023-04-29 NOTE — Assessment & Plan Note (Signed)
Chronic, lower risk given ratio of HDL to LDL.

## 2023-04-29 NOTE — Progress Notes (Signed)
Patient ID: Candice Gray, female    DOB: 1955-12-25, 67 y.o.   MRN: [id number]  This visit was conducted in person.  BP 130/80 (BP Location: Left Arm, Patient Position: Sitting, Cuff Size: Normal)   Pulse 74   Temp 98.6 F (37 C) (Temporal)   Ht 5' 7.75" (1.721 m)   Wt 122 lb 8 oz (55.6 kg)   SpO2 96%   BMI 18.76 kg/m    CC:  Chief Complaint  Patient presents with   Annual Exam    Part 2 ([facility] 04/27/2023)    Subjective:   HPI: Candice Gray is a 67 y.o. female presenting on 04/29/2023 for Annual Exam (Part 2 ([facility] 04/27/2023))  The patient presents for review of chronic health problems. He/She also has the following acute concerns today: Hyperparathyroid per Dr. Gerrit Friends.Marland Kitchen US showed no specific adenoma  Has upcoming CT parathyroid   She feels much better after stopping calcium, biotin, vit D3  The patient saw a LPN or RN for medicare wellness visit.  April 27, 2023  Prevention and wellness was reviewed in detail. Note reviewed and important notes copied below.  Hypertension:  At goal in office today on amlodipine 2.5 mg p.o. daily and metoprolol 25 mg half tablet twice daily BP Readings from Last 3 Encounters:  04/29/23 130/80  04/08/23 130/74  02/08/23 (!) 154/76  Using medication without problems or lightheadedness:  none Chest pain with exertion: none Edema: none Short of breath:none Average home BPs: Other issues:   prediabetes Lab Results  Component Value Date   HGBA1C 6.3 04/18/2023    Elevated Cholesterol: Almost at goal less than 70 given aortic atherosclerosis on statin atorvastatin 10 mg p.o. daily Lab Results  Component Value Date   CHOL 202 (H) 04/18/2023   HDL 100.10 04/18/2023   LDLCALC 90 04/18/2023   LDLDIRECT 74.6 12/31/2011   TRIG 55.0 04/18/2023   CHOLHDL 2 04/18/2023  Using medications without problems: Muscle aches:  Diet compliance: stable Exercise:  walking, but less lately Other  complaints:   Hyperparathyroid,SIADH ( recent low at 128 ( was 131 on 02/2022)), low mg followed by ENDO Dr. Sharl Ma. On Reclast for osteoporosis     Seizures:  No seizures. Stable on lamictal for 4 years. Neuro  Dr. Terrace Arabia    Relevant past medical, surgical, family and social history reviewed and updated as indicated. Interim medical history since our last visit reviewed. Allergies and medications reviewed and updated. Outpatient Medications Prior to Visit  Medication Sig Dispense Refill   amLODipine (NORVASC) 2.5 MG tablet TAKE 1 TABLET BY MOUTH TWICE DAILY 180 tablet 0   atorvastatin (LIPITOR) 10 MG tablet Take 1 tablet (10 mg total) by mouth every Monday, Wednesday, and Friday. 36 tablet 3   hydrocortisone 2.5 % cream Apply 1 application topically as needed.      lamoTRIgine (LAMICTAL) 100 MG tablet Take 1 tablet (100 mg total) by mouth 2 (two) times daily. 180 tablet 4   latanoprost (XALATAN) 0.005 % ophthalmic solution Place 1 drop into both eyes at bedtime.     metoprolol tartrate (LOPRESSOR) 25 MG tablet TAKE 1/2 TABLET BY MOUTH TWICE DAILY 90 tablet 3   zoledronic acid (RECLAST) 5 MG/100ML SOLN injection Inject 5 mg into the vein once.     vitamin C (ASCORBIC ACID) 500 MG tablet Take 500 mg by mouth daily.     No facility-administered medications prior to visit.     Per HPI unless specifically  indicated in ROS section below Review of Systems  Constitutional:  Negative for fatigue and fever.  HENT:  Negative for congestion.   Eyes:  Negative for pain.  Respiratory:  Negative for cough and shortness of breath.   Cardiovascular:  Negative for chest pain, palpitations and leg swelling.  Gastrointestinal:  Negative for abdominal pain.  Genitourinary:  Negative for dysuria and vaginal bleeding.  Musculoskeletal:  Negative for back pain.  Neurological:  Negative for syncope, light-headedness and headaches.  Psychiatric/Behavioral:  Negative for dysphoric mood.    Objective:  BP  130/80 (BP Location: Left Arm, Patient Position: Sitting, Cuff Size: Normal)   Pulse 74   Temp 98.6 F (37 C) (Temporal)   Ht 5' 7.75" (1.721 m)   Wt 122 lb 8 oz (55.6 kg)   SpO2 96%   BMI 18.76 kg/m   Wt Readings from Last 3 Encounters:  04/29/23 122 lb 8 oz (55.6 kg)  04/27/23 120 lb (54.4 kg)  04/08/23 124 lb 2 oz (56.3 kg)      Physical Exam Constitutional:      General: She is not in acute distress.    Appearance: Normal appearance. She is well-developed. She is not ill-appearing or toxic-appearing.  HENT:     Head: Normocephalic.     Right Ear: Hearing, tympanic membrane, ear canal and external ear normal. Tympanic membrane is not erythematous, retracted or bulging.     Left Ear: Hearing, tympanic membrane, ear canal and external ear normal. Tympanic membrane is not erythematous, retracted or bulging.     Nose: No mucosal edema or rhinorrhea.     Right Sinus: No maxillary sinus tenderness or frontal sinus tenderness.     Left Sinus: No maxillary sinus tenderness or frontal sinus tenderness.     Mouth/Throat:     Mouth: Oropharynx is clear and moist and mucous membranes are normal.     Pharynx: Uvula midline.  Eyes:     General: Lids are normal. Lids are everted, no foreign bodies appreciated.     Extraocular Movements: EOM normal.     Conjunctiva/sclera: Conjunctivae normal.     Pupils: Pupils are equal, round, and reactive to light.  Neck:     Thyroid: No thyroid mass or thyromegaly.     Vascular: No carotid bruit.     Trachea: Trachea normal.  Cardiovascular:     Rate and Rhythm: Normal rate and regular rhythm.     Pulses: Normal pulses.     Heart sounds: Normal heart sounds, S1 normal and S2 normal. No murmur heard.    No friction rub. No gallop.  Pulmonary:     Effort: Pulmonary effort is normal. No tachypnea or respiratory distress.     Breath sounds: Normal breath sounds. No decreased breath sounds, wheezing, rhonchi or rales.  Abdominal:     General: Bowel  sounds are normal.     Palpations: Abdomen is soft.     Tenderness: There is no abdominal tenderness.  Musculoskeletal:     Cervical back: Normal range of motion and neck supple.  Skin:    General: Skin is warm, dry and intact.     Findings: No rash.  Neurological:     Mental Status: She is alert.  Psychiatric:        Mood and Affect: Mood is not anxious or depressed.        Speech: Speech normal.        Behavior: Behavior normal. Behavior is cooperative.  Thought Content: Thought content normal.        Cognition and Memory: Cognition and memory normal.        Judgment: Judgment normal.       Results for orders placed or performed in visit on 04/18/23  Hemoglobin A1c  Result Value Ref Range   Hgb A1c MFr Bld 6.3 4.6 - 6.5 %  Lipid panel  Result Value Ref Range   Cholesterol 202 (H) 0 - 200 mg/dL   Triglycerides 01.0 0.0 - 149.0 mg/dL   HDL 272.53 >66.44 mg/dL   VLDL 03.4 0.0 - 74.2 mg/dL   LDL Cholesterol 90 0 - 99 mg/dL   Total CHOL/HDL Ratio 2    NonHDL 101.49      COVID 19 screen:  No recent travel or known exposure to COVID19 The patient denies respiratory symptoms of COVID 19 at this time. The importance of social distancing was discussed today.   Assessment and Plan   The patient's preventative maintenance and recommended screening tests for an annual wellness exam were reviewed in full today. Brought up to date unless services declined.  Counselled on the importance of diet, exercise, and its role in overall health and mortality. The patient's FH and SH was reviewed, including their home life, tobacco status, and drug and alcohol status.   Last DEXA  2015 worsening osteopenia, stopped boniva. 2017 osteoporosis ENDO treating.. Started Reclast 2019 per ENDO.Marland Kitchen  last DEXA 09/06/2022 Last colon: 2011 was bengin polyps.. No repeat screen in this way given small caliber colon. Positive cologuard 2024.. referred for colonoscopy, Dr. Lavon Paganini.  Mammo nml 02/2023  nml Pap q 5 years, last nml in 2021  neg HPV, no further indicated. Counseled against marijuana use.   Nonsmoker Hep C: neg Vaccines: refused COVID,  Uptodate with td, PNA and flu, consider shingrix.  Problem List Items Addressed This Visit     Aortic atherosclerosis (HCC)    On atorvastatin 10 mg p.o. daily      Hyperlipidemia    Chronic, lower risk given ratio of HDL to LDL.      Hyperparathyroidism (HCC)    per Dr. Gerrit Friends.Marland Kitchen US showed now specific adenoma  Has upcoming CT parathyroid      Relevant Orders   Comprehensive metabolic panel   VITAMIN D 25 Hydroxy (Vit-D Deficiency, Fractures)   HYPERTENSION, BENIGN ESSENTIAL, LABILE    Stable, chronic.  Continue current medication.  Amlodipine 2.5 mg p.o. daily Metoprolol 25 mg half tablet twice daily      Osteoporosis    Chronic, increased risk for fracture.   On Reclast per Endo      Prediabetes    Chronic, slight worsening in the last year.  Discussed decreasing carbohydrates without further weight loss.  Increase protein source for calories.      Seizures (HCC)    Chronic, no seizures on Lamictal 100 mg 2 times daily.  Followed by neurology.      SIADH (syndrome of inappropriate ADH production) (HCC)    Chronic,  Followed by Dr. Sharl Ma      Relevant Orders   Comprehensive metabolic panel   Other Visit Diagnoses     Routine general medical examination at a health care facility    -  Primary        Kerby Nora, MD

## 2023-04-29 NOTE — Assessment & Plan Note (Signed)
Chronic,  Followed by Dr. Sharl Ma

## 2023-04-29 NOTE — Assessment & Plan Note (Signed)
On atorvastatin 10 mg p.o. daily

## 2023-05-05 ENCOUNTER — Ambulatory Visit
Admission: RE | Admit: 2023-05-05 | Discharge: 2023-05-05 | Disposition: A | Discharge status: Home / Self Care | Payer: Medicare Other | Source: Ambulatory Visit | Attending: Surgery | Admitting: Surgery

## 2023-05-05 DIAGNOSIS — E213 Hyperparathyroidism, unspecified: Secondary | ICD-10-CM

## 2023-05-05 MED ORDER — IOPAMIDOL (ISOVUE-300) INJECTION 61%
200.0000 mL | Freq: Once | INTRAVENOUS | Status: AC | PRN
  Administered 2023-05-05: 75 mL via INTRAVENOUS

## 2023-05-20 ENCOUNTER — Ambulatory Visit: Payer: Self-pay | Admitting: Surgery

## 2023-05-20 NOTE — Progress Notes (Signed)
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  Will enter orders and send to schedulers to contact patient.  Plan to proceed with minimally invasive parathyroidectomy as discussed in the office.  Darnell Level, MD [facility] practice Office: [phone]

## 2023-05-30 ENCOUNTER — Telehealth: Payer: Self-pay | Admitting: *Deleted

## 2023-05-30 NOTE — Telephone Encounter (Signed)
 Noted.

## 2023-05-30 NOTE — Telephone Encounter (Signed)
 Copied from CRM [id number]. Topic: General - Other >> May 30, 2023  1:08 PM Suzette B wrote: Reason for CRM: Scheduled for parathyroid surgery 06/17/23 with dr. Myrtice Lauth please be advised

## 2023-06-07 ENCOUNTER — Other Ambulatory Visit: Payer: Self-pay | Admitting: Family Medicine

## 2023-06-07 NOTE — Patient Instructions (Signed)
 DUE TO COVID-19 ONLY TWO VISITORS  (aged 68 and older)  ARE ALLOWED TO COME WITH YOU AND STAY IN THE WAITING ROOM ONLY DURING PRE OP AND PROCEDURE.   **NO VISITORS ARE ALLOWED IN THE SHORT STAY AREA OR RECOVERY ROOM!!**  IF YOU WILL BE ADMITTED INTO THE HOSPITAL YOU ARE ALLOWED ONLY FOUR SUPPORT PEOPLE DURING VISITATION HOURS ONLY (7 AM -8PM)   The support person(s) must pass our screening, gel in and out, and wear a mask at all times, including in the patient's room. Patients must also wear a mask when staff or their support person are in the room. Visitors GUEST BADGE MUST BE WORN VISIBLY  One adult visitor may remain with you overnight and MUST be in the room by 8 P.M.     Your procedure is scheduled on: 06/17/23   Report to [facility] Main Entrance    Report to admitting at : 7:45 AM   Call this number if you have problems the morning of surgery [phone]   Do not eat food :After Midnight.   After Midnight you may have the following liquids until : 7:00 AM DAY OF SURGERY  Water Black Coffee (sugar ok, NO MILK/CREAM OR CREAMERS)  Tea (sugar ok, NO MILK/CREAM OR CREAMERS) regular and decaf                             Plain Jell-O (NO RED)                                           Fruit ices (not with fruit pulp, NO RED)                                     Popsicles (NO RED)                                                                  Juice: apple, WHITE grape, WHITE cranberry Sports drinks like Gatorade (NO RED)             FOLLOW ANY ADDITIONAL PRE OP INSTRUCTIONS YOU RECEIVED FROM YOUR SURGEON'S OFFICE!!!   Oral Hygiene is also important to reduce your risk of infection.                                    Remember - BRUSH YOUR TEETH THE MORNING OF SURGERY WITH YOUR REGULAR TOOTHPASTE  DENTURES WILL BE REMOVED PRIOR TO SURGERY PLEASE DO NOT APPLY "Poly grip" OR ADHESIVES!!!   Do NOT smoke after Midnight   Take these medicines the morning of surgery with A  SIP OF WATER: lamotrigine ,metoprolol ,amlodipine .  DO NOT TAKE ANY ORAL DIABETIC MEDICATIONS DAY OF YOUR SURGERY                You may not have any metal on your body including hair pins, jewelry, and body piercing             Do  not wear make-up, lotions, powders, perfumes/cologne, or deodorant  Do not wear nail polish including gel and S&S, artificial/acrylic nails, or any other type of covering on natural nails including finger and toenails. If you have artificial nails, gel coating, etc. that needs to be removed by a nail salon please have this removed prior to surgery or surgery may need to be canceled/ delayed if the surgeon/ anesthesia feels like they are unable to be safely monitored.   Do not shave  48 hours prior to surgery.    Do not bring valuables to the hospital. [facility] IS NOT             RESPONSIBLE   FOR VALUABLES.   Contacts, glasses, or bridgework may not be worn into surgery.   Bring small overnight bag day of surgery.   DO NOT BRING YOUR HOME MEDICATIONS TO THE HOSPITAL. PHARMACY WILL DISPENSE MEDICATIONS LISTED ON YOUR MEDICATION LIST TO YOU DURING YOUR ADMISSION IN THE HOSPITAL!    Patients discharged on the day of surgery will not be allowed to drive home.  Someone NEEDS to stay with you for the first 24 hours after anesthesia.   Special Instructions: Bring a copy of your healthcare power of attorney and living will documents         the day of surgery if you haven't scanned them before.              Please read over the following fact sheets you were given: IF YOU HAVE QUESTIONS ABOUT YOUR PRE-OP INSTRUCTIONS PLEASE CALL [phone]    [facility] - Preparing for Surgery Before surgery, you can play an important role.  Because skin is not sterile, your skin needs to be as free of germs as possible.  You can reduce the number of germs on your skin by washing with CHG (chlorahexidine gluconate) soap before surgery.  CHG is an antiseptic cleaner which kills  germs and bonds with the skin to continue killing germs even after washing. Please DO NOT use if you have an allergy to CHG or antibacterial soaps.  If your skin becomes reddened/irritated stop using the CHG and inform your nurse when you arrive at Short Stay. Do not shave (including legs and underarms) for at least 48 hours prior to the first CHG shower.  You may shave your face/neck. Please follow these instructions carefully:  1.  Shower with CHG Soap the night before surgery and the  morning of Surgery.  2.  If you choose to wash your hair, wash your hair first as usual with your  normal  shampoo.  3.  After you shampoo, rinse your hair and body thoroughly to remove the  shampoo.                           4.  Use CHG as you would any other liquid soap.  You can apply chg directly  to the skin and wash                       Gently with a scrungie or clean washcloth.  5.  Apply the CHG Soap to your body ONLY FROM THE NECK DOWN.   Do not use on face/ open                           Wound or open sores. Avoid contact with eyes, ears  mouth and genitals (private parts).                       Wash face,  Genitals (private parts) with your normal soap.             6.  Wash thoroughly, paying special attention to the area where your surgery  will be performed.  7.  Thoroughly rinse your body with warm water from the neck down.  8.  DO NOT shower/wash with your normal soap after using and rinsing off  the CHG Soap.                9.  Pat yourself dry with a clean towel.            10.  Wear clean pajamas.            11.  Place clean sheets on your bed the night of your first shower and do not  sleep with pets. Day of Surgery : Do not apply any lotions/deodorants the morning of surgery.  Please wear clean clothes to the hospital/surgery center.  FAILURE TO FOLLOW THESE INSTRUCTIONS MAY RESULT IN THE CANCELLATION OF YOUR SURGERY PATIENT SIGNATURE_________________________________  NURSE  SIGNATURE__________________________________  ________________________________________________________________________

## 2023-06-08 ENCOUNTER — Other Ambulatory Visit: Payer: Self-pay

## 2023-06-08 ENCOUNTER — Encounter (HOSPITAL_COMMUNITY)
Admission: RE | Admit: 2023-06-08 | Discharge: 2023-06-08 | Disposition: A | Discharge status: Home / Self Care | Payer: Medicare Other | Source: Ambulatory Visit | Attending: Surgery | Admitting: Surgery

## 2023-06-08 ENCOUNTER — Encounter (HOSPITAL_COMMUNITY): Payer: Self-pay

## 2023-06-08 VITALS — BP 160/90 | HR 83 | Temp 97.6°F | Ht 67.75 in | Wt 123.0 lb

## 2023-06-08 DIAGNOSIS — I10 Essential (primary) hypertension: Secondary | ICD-10-CM | POA: Diagnosis not present

## 2023-06-08 DIAGNOSIS — Z01818 Encounter for other preprocedural examination: Secondary | ICD-10-CM | POA: Diagnosis present

## 2023-06-08 HISTORY — DX: Pneumonia, unspecified organism: J18.9

## 2023-06-08 HISTORY — DX: Prediabetes: R73.03

## 2023-06-08 LAB — CBC
HCT: 41.6 % (ref 36.0–46.0)
Hemoglobin: 13.6 g/dL (ref 12.0–15.0)
MCH: 31 pg (ref 26.0–34.0)
MCHC: 32.7 g/dL (ref 30.0–36.0)
MCV: 94.8 fL (ref 80.0–100.0)
Platelets: 321 10*3/uL (ref 150–400)
RBC: 4.39 MIL/uL (ref 3.87–5.11)
RDW: 13.6 % (ref 11.5–15.5)
WBC: 10.5 10*3/uL (ref 4.0–10.5)
nRBC: 0 % (ref 0.0–0.2)

## 2023-06-08 LAB — BASIC METABOLIC PANEL
Anion gap: 10 (ref 5–15)
BUN: 13 mg/dL (ref 8–23)
CO2: 26 mmol/L (ref 22–32)
Calcium: 9.9 mg/dL (ref 8.9–10.3)
Chloride: 93 mmol/L — ABNORMAL LOW (ref 98–111)
Creatinine, Ser: 0.61 mg/dL (ref 0.44–1.00)
GFR, Estimated: >60 mL/min (ref >60)
Glucose, Bld: 111 mg/dL — ABNORMAL HIGH (ref 70–99)
Potassium: 4 mmol/L (ref 3.5–5.1)
Sodium: 129 mmol/L — ABNORMAL LOW (ref 135–145)

## 2023-06-08 NOTE — Progress Notes (Addendum)
 For Anesthesia: PCP - Judithann Novas, MD  Cardiologist - Dr. Dinah Franco: LOV: 06/20/15  Bowel Prep reminder:  Chest x-ray -  EKG - 06/08/23 Stress Test -  ECHO - 03/27/16 Cardiac Cath -  Pacemaker/ICD device last checked: Pacemaker orders received: Device Rep notified:  Spinal Cord Stimulator:N/A  Sleep Study - N/A CPAP -   Fasting Blood Sugar - N/A Checks Blood Sugar _____ times a day Date and result of last Hgb A1c-6.3: 04/18/23  Last dose of GLP1 agonist- N/A GLP1 instructions:   Last dose of SGLT-2 inhibitors- N/A SGLT-2 instructions:   Blood Thinner Instructions: N/A Aspirin  Instructions: Last Dose:  Activity level: Can go up a flight of stairs and activities of daily living without stopping and without chest pain and/or shortness of breath   Able to exercise without chest pain and/or shortness of breath    Anesthesia review: Hx: HTN,MVP,seizure(only one time : 2017), Raynaud phenomenon ,Pre-diabetes. BP was elevated during PST appointment: 175/107;164/100. Manually: 160/90. As per pt., she took metoprolol  and amlodipine  today, and BP has been normal when she went to MD. Appointments.Camilo Cella PA was notified about it. Pt. Will be evaluated the DOS.  Patient denies shortness of breath, fever, cough and chest pain at PAT appointment   Patient verbalized understanding of instructions that were given to them at the PAT appointment. Patient was also instructed that they will need to review over the PAT instructions again at home before surgery.

## 2023-06-08 NOTE — Progress Notes (Signed)
Lab. Results: Sodium: 129

## 2023-06-10 ENCOUNTER — Other Ambulatory Visit: Payer: Self-pay

## 2023-06-10 ENCOUNTER — Telehealth: Payer: Self-pay

## 2023-06-10 ENCOUNTER — Ambulatory Visit (AMBULATORY_SURGERY_CENTER): Payer: Medicare Other

## 2023-06-10 VITALS — Ht 67.0 in | Wt 122.0 lb

## 2023-06-10 DIAGNOSIS — Z1211 Encounter for screening for malignant neoplasm of colon: Secondary | ICD-10-CM

## 2023-06-10 MED ORDER — NA SULFATE-K SULFATE-MG SULF 17.5-3.13-1.6 GM/177ML PO SOLN: 1.0000 | Freq: Once | ORAL | 0 refills | Status: AC

## 2023-06-10 NOTE — Progress Notes (Signed)
Denies allergies to eggs or soy products. Denies complication of anesthesia or sedation. Denies use of weight loss medication. Denies use of O2.   Emmi instructions given for colonoscopy.  

## 2023-06-10 NOTE — Telephone Encounter (Signed)
Dr. Lavon Paganini and Candice Gray,  I saw this patient in PV today. Candice Gray colonoscopy is scheduled for 06/29/23. Candice Gray is scheduled for Thyroid surgery on 06/17/23. Please advise if her surgery is too close to her procedure time. I discussed this with the patient and she does not know what to expect after thyroid surgery. Patient states that she would like to try to get the colonoscopy completed if possible. I did complete her PV and told her we would get back with her if she should post-pone her colonoscopy. Thanks!   Candice Dane, LPN ( PV )

## 2023-06-11 ENCOUNTER — Encounter (HOSPITAL_COMMUNITY): Payer: Self-pay | Admitting: Surgery

## 2023-06-11 NOTE — H&P (Signed)
REFERRING PHYSICIAN: Tonita Cong, MD  PROVIDER: Rece Zechman Myra Rude, MD   Chief Complaint: New Consultation (Primary hyperparathyroidism)  History of Present Illness:  Patient is referred by Dr. Talmage Coin for surgical evaluation and management of suspected primary hyperparathyroidism. Patient has been followed for several years with hypercalcemia. Patient has been diagnosed with osteoporosis. She notes fatigue. She has bone and joint discomfort. She complains of mental fogginess. She has had urinary frequency. She denies nephrolithiasis. Patient has not had any imaging studies performed. Patient has had no prior head or neck surgery. There is no family history of parathyroid disease. She is accompanied today by her husband.  Review of Systems: A complete review of systems was obtained from the patient. I have reviewed this information and discussed as appropriate with the patient. See HPI as well for other ROS.  Review of Systems  Constitutional: Positive for malaise/fatigue.  HENT: Negative.  Eyes: Negative.  Respiratory: Negative.  Cardiovascular: Negative.  Gastrointestinal: Negative.  Genitourinary: Positive for frequency.  Musculoskeletal: Positive for joint pain.  Skin: Negative.  Neurological: Positive for tremors.  Endo/Heme/Allergies: Negative.  Psychiatric/Behavioral: Positive for memory loss.    Medical History: Past Medical History:  Diagnosis Date  Glaucoma (increased eye pressure)  Hypertension  Seizures (CMS/HHS-HCC)   Patient Active Problem List  Diagnosis  Primary hyperparathyroidism (CMS/HHS-HCC)   Past Surgical History:  Procedure Laterality Date  CATARACT EXTRACTION    Allergies  Allergen Reactions  Bupropion Swelling  Polyethylene Glycol 3350 Itching   Current Outpatient Medications on File Prior to Visit  Medication Sig Dispense Refill  amLODIPine (NORVASC) 2.5 MG tablet Take 2.5 mg by mouth 2 (two) times daily  ascorbic acid,  vitamin C, (VITAMIN C) 500 MG tablet Take 500 mg by mouth once daily  atorvastatin (LIPITOR) 10 MG tablet Take 10 mg by mouth every Monday, Wednesday, and Friday  hydrocortisone 2.5 % cream Apply 1 Application topically as needed  lamoTRIgine (LAMICTAL) 100 MG tablet Take 100 mg by mouth 2 (two) times daily  latanoprost (XALATAN) 0.005 % ophthalmic solution Place 1 drop into both eyes at bedtime  metoprolol tartrate (LOPRESSOR) 25 MG tablet Take 12.5 mg by mouth 2 (two) times daily  zoledronic acid (RECLAST) 5 mg/100 mL injection Inject 5 mg into the vein once   No current facility-administered medications on file prior to visit.   Family History  Problem Relation Age of Onset  Hyperlipidemia (Elevated cholesterol) Father  High blood pressure (Hypertension) Father  Diabetes Father  High blood pressure (Hypertension) Brother  Hyperlipidemia (Elevated cholesterol) Brother    Social History   Tobacco Use  Smoking Status Former  Types: Cigarettes  Smokeless Tobacco Never    Social History   Socioeconomic History  Marital status: Married  Tobacco Use  Smoking status: Former  Types: Cigarettes  Smokeless tobacco: Never  Vaping Use  Vaping status: Never Used  Substance and Sexual Activity  Alcohol use: Yes  Drug use: Yes  Types: Marijuana   Social Drivers of Corporate investment banker Strain: Low Risk (10/05/2022)  Received from [facility]  Overall Financial Resource Strain (CARDIA)  Difficulty of Paying Living Expenses: Not hard at all  Food Insecurity: No Food Insecurity (10/05/2022)  Received from [facility]  Hunger Vital Sign  Worried About Running Out of Food in the Last Year: Never true  Ran Out of Food in the Last Year: Never true  Transportation Needs: No Transportation Needs (10/05/2022)  Received from [facility]  PRAPARE - Risk analyst (Medical): No  Lack of Transportation (Non-Medical): No  Physical Activity: Unknown (10/05/2022)   Received [facility]  Exercise Vital Sign  Days of Exercise per Week: Patient declined  Minutes of Exercise per Session: 30 min  Stress: No Stress Concern Present (10/05/2022)  Received from [facility] - Occupational Stress Questionnaire  Feeling of Stress : Not at all  Social Connections: Unknown (10/05/2022)  Received from [facility]  Social Connection and Isolation Panel [NHANES]  Frequency of Communication with Friends and Family: Once a week  Frequency of Social Gatherings with Friends and Family: Patient declined  Attends Religious Services: Never  Database administrator or Organizations: No  Attends Engineer, structural: Never  Marital Status: Married   Objective:   Vitals:  BP: 130/78  Pulse: 97  Temp: 36.7 C (98.1 F)  SpO2: 98%  Weight: 56.1 kg (123 lb 9.6 oz)  Height: 172.7 cm (5\' 8" )  PainSc: 0-No pain   Body mass index is 18.79 kg/m.  Physical Exam   GENERAL APPEARANCE Comfortable, no acute issues Development: normal Gross deformities: none  SKIN Rash, lesions, ulcers: none Induration, erythema: none Nodules: none palpable  EYES Conjunctiva and lids: normal Pupils: equal and reactive  EARS, NOSE, MOUTH, THROAT External ears: no lesion or deformity External nose: no lesion or deformity Hearing: grossly normal  NECK Symmetric: yes Trachea: midline Thyroid: no palpable nodules in the thyroid bed  ABDOMEN Not assessed  GENITOURINARY/RECTAL Not assessed  MUSCULOSKELETAL Station and gait: normal Digits and nails: no clubbing or cyanosis Muscle strength: grossly normal all extremities Range of motion: grossly normal all extremities Deformity: none  LYMPHATIC Cervical: none palpable Supraclavicular: none palpable  PSYCHIATRIC Oriented to person, place, and time: yes Mood and affect: normal for situation Judgment and insight: appropriate for situation   Assessment and Plan:    Primary hyperparathyroidism (CMS/HHS-HCC)  Patient is referred by her endocrinologist, Dr. Debara Pickett, for surgical evaluation and management of primary hyperparathyroidism.  Patient provided with a copy of "Parathyroid Surgery: Treatment for Your Parathyroid Gland Problem", published by [employer], 12 pages. Book reviewed and explained to patient during visit today.  Today we reviewed her clinical history. We reviewed her recent laboratory studies. We discussed her symptoms. I would like to proceed with imaging studies to include an ultrasound and a nuclear medicine parathyroid scan with sestamibi. These 2 studies are about 80% successful in confirming the diagnosis and localizing a parathyroid adenoma. If they are successful, then I believe she will be a good candidate for minimally invasive outpatient parathyroid surgery. We discussed the procedure. We discussed the size and location of the surgical incision. We discussed the risk of surgery including the risk of recurrent laryngeal nerve injury. We discussed the postoperative recovery.  If the studies failed to identify an adenoma, then we will request a 4D CT scan of the neck with parathyroid protocol.  Patient will undergo the above studies. We will contact her with the results and make a decision regarding further management at that time.  Darnell Level, MD [facility] practice Office: [phone]

## 2023-06-14 NOTE — Telephone Encounter (Signed)
Agree, Candice Gray can you please inform patient recommendation to reschedule the procedure sometime in mid March or later.  Thank you

## 2023-06-15 NOTE — Telephone Encounter (Signed)
Left patient message on home phone voice mail,  letting them know that they would need to call back and reschedule the elective colonoscopy at least 6 weeks post op thyroidectomy, per Dr. Lavon Paganini.

## 2023-06-15 NOTE — Telephone Encounter (Signed)
Dr. Lavon Paganini,   Just as an FYI. Please see attached message. Pt advised she would still need to wait 6 weeks post op to proceed.

## 2023-06-15 NOTE — Telephone Encounter (Signed)
Patient called stated the information provided  to Dr. Lavon Paganini is incorrect. The patient stated the procedure is she scheduled for is peritectomy not a thyroidectomy.

## 2023-06-15 NOTE — Telephone Encounter (Signed)
Left patient message on mobile phone voice mail, letting them know that they would need to call back and reschedule the elective colonoscopy at least 6 weeks post op thyroidectomy, per Dr. Lavon Paganini.

## 2023-06-16 NOTE — Telephone Encounter (Signed)
I know she is getting parathyroidectomy, it is better to wait 6 weeks to make sure it is completely healed, parathyroids are situated near the thyroid in the neck and anesthetist wants her to heal well from the surgery prior to going through anesthesia and the procedure.  Please inform patient, thank you.

## 2023-06-17 ENCOUNTER — Ambulatory Visit (HOSPITAL_COMMUNITY)
Admission: RE | Admit: 2023-06-17 | Discharge: 2023-06-17 | Disposition: A | Discharge status: Home / Self Care | Payer: Medicare Other | Attending: Surgery | Admitting: Surgery

## 2023-06-17 ENCOUNTER — Encounter (HOSPITAL_COMMUNITY)
Admission: RE | Disposition: A | Discharge status: Home / Self Care | Payer: Self-pay | Source: Home / Self Care | Attending: Surgery

## 2023-06-17 ENCOUNTER — Ambulatory Visit (HOSPITAL_BASED_OUTPATIENT_CLINIC_OR_DEPARTMENT_OTHER): Payer: Medicare Other | Admitting: Anesthesiology

## 2023-06-17 ENCOUNTER — Other Ambulatory Visit: Payer: Self-pay

## 2023-06-17 ENCOUNTER — Ambulatory Visit (HOSPITAL_COMMUNITY): Payer: Medicare Other | Admitting: Physician Assistant

## 2023-06-17 ENCOUNTER — Encounter (HOSPITAL_COMMUNITY): Payer: Self-pay | Admitting: Surgery

## 2023-06-17 DIAGNOSIS — Z87891 Personal history of nicotine dependence: Secondary | ICD-10-CM | POA: Insufficient documentation

## 2023-06-17 DIAGNOSIS — E213 Hyperparathyroidism, unspecified: Secondary | ICD-10-CM | POA: Diagnosis present

## 2023-06-17 DIAGNOSIS — E210 Primary hyperparathyroidism: Secondary | ICD-10-CM

## 2023-06-17 DIAGNOSIS — F1290 Cannabis use, unspecified, uncomplicated: Secondary | ICD-10-CM | POA: Diagnosis not present

## 2023-06-17 DIAGNOSIS — I10 Essential (primary) hypertension: Secondary | ICD-10-CM | POA: Diagnosis not present

## 2023-06-17 DIAGNOSIS — E785 Hyperlipidemia, unspecified: Secondary | ICD-10-CM | POA: Diagnosis not present

## 2023-06-17 HISTORY — PX: PARATHYROIDECTOMY: SHX19

## 2023-06-17 SURGERY — PARATHYROIDECTOMY
Anesthesia: General | Site: Neck | Laterality: Right

## 2023-06-17 MED ORDER — LIDOCAINE HCL (CARDIAC) PF 100 MG/5ML IV SOSY
PREFILLED_SYRINGE | INTRAVENOUS | Status: DC | PRN
  Administered 2023-06-17: 60 mg via INTRAVENOUS

## 2023-06-17 MED ORDER — BUPIVACAINE HCL 0.25 % IJ SOLN
INTRAMUSCULAR | Status: AC
  Filled 2023-06-17: qty 1

## 2023-06-17 MED ORDER — MIDAZOLAM HCL 5 MG/5ML IJ SOLN
INTRAMUSCULAR | Status: DC | PRN
  Administered 2023-06-17: 2 mg via INTRAVENOUS

## 2023-06-17 MED ORDER — CHLORHEXIDINE GLUCONATE CLOTH 2 % EX PADS: 6.0000 | MEDICATED_PAD | Freq: Once | CUTANEOUS | Status: DC

## 2023-06-17 MED ORDER — DEXAMETHASONE SODIUM PHOSPHATE 10 MG/ML IJ SOLN
INTRAMUSCULAR | Status: DC | PRN
  Administered 2023-06-17: 10 mg via INTRAVENOUS

## 2023-06-17 MED ORDER — FENTANYL CITRATE (PF) 100 MCG/2ML IJ SOLN
INTRAMUSCULAR | Status: AC
  Filled 2023-06-17: qty 2

## 2023-06-17 MED ORDER — HEMOSTATIC AGENTS (NO CHARGE) OPTIME
TOPICAL | Status: DC | PRN
  Administered 2023-06-17: 1 via TOPICAL

## 2023-06-17 MED ORDER — CHLORHEXIDINE GLUCONATE 0.12 % MT SOLN
15.0000 mL | Freq: Once | OROMUCOSAL | Status: AC
  Administered 2023-06-17: 15 mL via OROMUCOSAL

## 2023-06-17 MED ORDER — SUGAMMADEX SODIUM 200 MG/2ML IV SOLN
INTRAVENOUS | Status: DC | PRN
  Administered 2023-06-17: 200 mg via INTRAVENOUS

## 2023-06-17 MED ORDER — TRAMADOL HCL 50 MG PO TABS
ORAL_TABLET | ORAL | Status: AC
  Filled 2023-06-17: qty 1

## 2023-06-17 MED ORDER — ALBUMIN HUMAN 5 % IV SOLN: INTRAVENOUS | Status: DC | PRN

## 2023-06-17 MED ORDER — HYDROMORPHONE HCL 1 MG/ML IJ SOLN
0.2500 mg | INTRAMUSCULAR | Status: DC | PRN
Start: 2023-06-17 — End: 2023-06-17

## 2023-06-17 MED ORDER — ACETAMINOPHEN 500 MG PO TABS
1000.0000 mg | ORAL_TABLET | Freq: Once | ORAL | Status: AC
  Administered 2023-06-17: 1000 mg via ORAL
  Filled 2023-06-17: qty 2

## 2023-06-17 MED ORDER — LACTATED RINGERS IV SOLN: INTRAVENOUS | Status: DC

## 2023-06-17 MED ORDER — ROCURONIUM BROMIDE 100 MG/10ML IV SOLN
INTRAVENOUS | Status: DC | PRN
  Administered 2023-06-17: 50 mg via INTRAVENOUS

## 2023-06-17 MED ORDER — FENTANYL CITRATE (PF) 100 MCG/2ML IJ SOLN
INTRAMUSCULAR | Status: DC | PRN
  Administered 2023-06-17: 100 ug via INTRAVENOUS

## 2023-06-17 MED ORDER — PROPOFOL 10 MG/ML IV BOLUS
INTRAVENOUS | Status: AC
Start: 2023-06-17 — End: ?
  Filled 2023-06-17: qty 20

## 2023-06-17 MED ORDER — BUPIVACAINE HCL 0.25 % IJ SOLN
INTRAMUSCULAR | Status: DC | PRN
  Administered 2023-06-17: 10 mL

## 2023-06-17 MED ORDER — ONDANSETRON HCL 4 MG/2ML IJ SOLN
INTRAMUSCULAR | Status: DC | PRN
  Administered 2023-06-17: 4 mg via INTRAVENOUS

## 2023-06-17 MED ORDER — EPHEDRINE 5 MG/ML INJ
INTRAVENOUS | Status: AC
Start: 2023-06-17 — End: ?
  Filled 2023-06-17: qty 5

## 2023-06-17 MED ORDER — TRAMADOL HCL 50 MG PO TABS: 50.0000 mg | ORAL_TABLET | Freq: Four times a day (QID) | ORAL | 0 refills | Status: DC | PRN

## 2023-06-17 MED ORDER — PROPOFOL 10 MG/ML IV BOLUS
INTRAVENOUS | Status: DC | PRN
  Administered 2023-06-17: 125 mg via INTRAVENOUS

## 2023-06-17 MED ORDER — EPHEDRINE SULFATE-NACL 50-0.9 MG/10ML-% IV SOSY
PREFILLED_SYRINGE | INTRAVENOUS | Status: DC | PRN
  Administered 2023-06-17: 10 mg via INTRAVENOUS

## 2023-06-17 MED ORDER — ALBUMIN HUMAN 5 % IV SOLN
INTRAVENOUS | Status: AC
  Filled 2023-06-17: qty 250

## 2023-06-17 MED ORDER — CEFAZOLIN SODIUM-DEXTROSE 2-4 GM/100ML-% IV SOLN
2.0000 g | INTRAVENOUS | Status: AC
  Administered 2023-06-17: 2 g via INTRAVENOUS
  Filled 2023-06-17: qty 100

## 2023-06-17 MED ORDER — TRAMADOL HCL 50 MG PO TABS
50.0000 mg | ORAL_TABLET | Freq: Once | ORAL | Status: AC
  Administered 2023-06-17: 50 mg via ORAL

## 2023-06-17 MED ORDER — MIDAZOLAM HCL 2 MG/2ML IJ SOLN
INTRAMUSCULAR | Status: AC
  Filled 2023-06-17: qty 2

## 2023-06-17 MED ORDER — ORAL CARE MOUTH RINSE: 15.0000 mL | Freq: Once | OROMUCOSAL | Status: AC

## 2023-06-17 MED ORDER — 0.9 % SODIUM CHLORIDE (POUR BTL) OPTIME
TOPICAL | Status: DC | PRN
  Administered 2023-06-17: 1000 mL

## 2023-06-17 SURGICAL SUPPLY — 29 items
ATTRACTOMAT 16X20 MAGNETIC DRP (DRAPES) ×1 IMPLANT
BAG COUNTER SPONGE SURGICOUNT (BAG) ×1 IMPLANT
BLADE SURG 15 STRL LF DISP TIS (BLADE) ×1 IMPLANT
CHLORAPREP W/TINT 26 (MISCELLANEOUS) ×1 IMPLANT
CLIP TI MEDIUM 6 (CLIP) ×2 IMPLANT
CLIP TI WIDE RED SMALL 6 (CLIP) ×2 IMPLANT
COVER SURGICAL LIGHT HANDLE (MISCELLANEOUS) ×1 IMPLANT
DERMABOND ADVANCED .7 DNX12 (GAUZE/BANDAGES/DRESSINGS) ×1 IMPLANT
DRAPE LAPAROTOMY T 98X78 PEDS (DRAPES) ×1 IMPLANT
DRAPE UTILITY XL STRL (DRAPES) ×1 IMPLANT
ELECT REM PT RETURN 15FT ADLT (MISCELLANEOUS) ×1 IMPLANT
GAUZE 4X4 16PLY ~~LOC~~+RFID DBL (SPONGE) ×1 IMPLANT
GLOVE SURG ORTHO 8.0 STRL STRW (GLOVE) ×1 IMPLANT
GOWN STRL REUS W/ TWL XL LVL3 (GOWN DISPOSABLE) ×3 IMPLANT
HEMOSTAT SURGICEL 2X4 FIBR (HEMOSTASIS) ×1 IMPLANT
ILLUMINATOR WAVEGUIDE N/F (MISCELLANEOUS) IMPLANT
KIT BASIN OR (CUSTOM PROCEDURE TRAY) ×1 IMPLANT
KIT TURNOVER KIT A (KITS) IMPLANT
NDL HYPO 22X1.5 SAFETY MO (MISCELLANEOUS) ×1 IMPLANT
NEEDLE HYPO 22X1.5 SAFETY MO (MISCELLANEOUS) ×1
PACK BASIC VI WITH GOWN DISP (CUSTOM PROCEDURE TRAY) ×1 IMPLANT
PENCIL SMOKE EVACUATOR (MISCELLANEOUS) ×1 IMPLANT
SHEARS HARMONIC 9CM CVD (BLADE) IMPLANT
SUT MNCRL AB 4-0 PS2 18 (SUTURE) ×1 IMPLANT
SUT VIC AB 3-0 SH 18 (SUTURE) ×1 IMPLANT
SYR BULB IRRIG 60ML STRL (SYRINGE) ×1 IMPLANT
SYR CONTROL 10ML LL (SYRINGE) ×1 IMPLANT
TOWEL OR 17X26 10 PK STRL BLUE (TOWEL DISPOSABLE) ×1 IMPLANT
TUBING CONNECTING 10 (TUBING) ×1 IMPLANT

## 2023-06-17 NOTE — Op Note (Signed)
OPERATIVE REPORT - PARATHYROIDECTOMY  Preoperative diagnosis: Primary hyperparathyroidism  Postop diagnosis: Same  Procedure: Right inferior minimally invasive parathyroidectomy  Surgeon:  Darnell Level, MD  Anesthesia: General endotracheal  Estimated blood loss: Minimal  Preparation: ChloraPrep  Indications: Patient is referred by Dr. Talmage Coin for surgical evaluation and management of suspected primary hyperparathyroidism. Patient has been followed for several years with hypercalcemia. Patient has been diagnosed with osteoporosis. She notes fatigue. She has bone and joint discomfort. She complains of mental fogginess. She has had urinary frequency. She denies nephrolithiasis. Patient has not had any imaging studies performed. Patient has had no prior head or neck surgery. There is no family history of parathyroid disease. She is accompanied today by her husband.   Procedure: The patient was prepared in the pre-operative holding area. The patient was brought to the operating room and placed in a supine position on the operating room table. Following administration of general anesthesia, the patient was positioned and then prepped and draped in the usual strict aseptic fashion. After ascertaining that an adequate level of anesthesia been achieved, a neck incision was made with a #15 blade. Dissection was carried through subcutaneous tissues and platysma. Hemostasis was obtained with the electrocautery. Skin flaps were developed circumferentially and a Weitlander retractor was placed for exposure.  Strap muscles were incised in the midline. Strap muscles were reflected laterally exposing the thyroid lobe. With gentle blunt dissection the thyroid lobe was mobilized.  Dissection was carried posteriorly and an enlarged parathyroid gland was identified posterior and inferior to the inferior thyroid artery. It was gently mobilized. Vascular structures were divided between small ligaclips. Care was  taken to avoid the recurrent laryngeal nerve. The parathyroid gland was completely excised. It was submitted to pathology where frozen section confirmed hypercellular parathyroid tissue consistent with adenoma.  Further dissection on the right revealed a normal, quite small, right superior gland just above the inferior thyroid artery.  Neck was irrigated with warm saline and good hemostasis was noted. Fibrillar was placed in the operative field. Strap muscles were approximated in the midline with interrupted 3-0 Vicryl sutures. Platysma was closed with interrupted 3-0 Vicryl sutures. Marcaine was infiltrated circumferentially. Skin was closed with a running 4-0 Monocryl subcuticular suture. Wound was washed and dried and Dermabond was applied. Patient was awakened from anesthesia and brought to the recovery room. The patient tolerated the procedure well.   Darnell Level, MD [facility] Office: [phone]

## 2023-06-17 NOTE — Interval H&P Note (Signed)
History and Physical Interval Note:  06/17/2023 10:17 AM  Candice Gray  has presented today for surgery, with the diagnosis of Primary Hyperparathyroidism.  The various methods of treatment have been discussed with the patient and family. After consideration of risks, benefits and other options for treatment, the patient has consented to    Procedure(s): RIGHT INFERIOR PARATHYROIDECTOMY (Right) as a surgical intervention.    The patient's history has been reviewed, patient examined, no change in status, stable for surgery.  I have reviewed the patient's chart and labs.  Questions were answered to the patient's satisfaction.    Darnell Level, MD [facility] practice Office: [phone]  Darnell Level

## 2023-06-17 NOTE — Anesthesia Postprocedure Evaluation (Signed)
Anesthesia Post Note  Patient: Candice Gray  Procedure(s) Performed: RIGHT INFERIOR PARATHYROIDECTOMY (Right: Neck)     Patient location during evaluation: PACU Anesthesia Type: General Level of consciousness: awake and alert Pain management: pain level controlled Vital Signs Assessment: post-procedure vital signs reviewed and stable Respiratory status: spontaneous breathing, nonlabored ventilation and respiratory function stable Cardiovascular status: blood pressure returned to baseline and stable Postop Assessment: no apparent nausea or vomiting Anesthetic complications: no  No notable events documented.  Last Vitals:  Vitals:   06/17/23 1300 06/17/23 1323  BP: (!) 165/91 (!) 160/91  Pulse: 83 90  Resp: 17 18  Temp:  36.9 C  SpO2: 98% 98%    Last Pain:  Vitals:   06/17/23 1323  TempSrc: Oral  PainSc: 4                  Havanna Groner,W. EDMOND

## 2023-06-17 NOTE — Transfer of Care (Signed)
Immediate Anesthesia Transfer of Care Note  Patient: [name]  Procedure(s) Performed: RIGHT INFERIOR PARATHYROIDECTOMY (Right: Neck)  Patient Location: PACU  Anesthesia Type:General  Level of Consciousness: sedated  Airway & Oxygen Therapy: Patient Spontanous Breathing and Patient connected to face mask  Post-op Assessment: Report given to RN and Post -op Vital signs reviewed and stable  Post vital signs: Reviewed and stable  Last Vitals:  Vitals Value Taken Time  BP 153/79 06/17/23 1156  Temp    Pulse 83 06/17/23 1158  Resp    SpO2 100 % 06/17/23 1158  Vitals shown include unfiled device data.  Last Pain:  Vitals:   06/17/23 0819  TempSrc: Oral  PainSc: 0-No pain      Patients Stated Pain Goal: 5 (06/17/23 0819)  Complications: No notable events documented.

## 2023-06-17 NOTE — Anesthesia Procedure Notes (Cosign Needed)
Procedure Name: Intubation Date/Time: 06/17/2023 10:36 AM  Performed by: Gaynelle Adu, MDPre-anesthesia Checklist: Patient identified, Emergency Drugs available, Suction available, Patient being monitored and Timeout performed Patient Re-evaluated:Patient Re-evaluated prior to induction Oxygen Delivery Method: Circle system utilized Preoxygenation: Pre-oxygenation with 100% oxygen Induction Type: IV induction Ventilation: Mask ventilation without difficulty Laryngoscope Size: Mac and 3 Grade View: Grade I Tube type: Oral Tube size: 7.0 mm Number of attempts: 1 Airway Equipment and Method: Stylet Placement Confirmation: ETT inserted through vocal cords under direct vision, positive ETCO2 and breath sounds checked- equal and bilateral Secured at: 22 cm Tube secured with: Tape Dental Injury: Teeth and Oropharynx as per pre-operative assessment

## 2023-06-17 NOTE — Discharge Instructions (Addendum)
[facility] - Dr. Darnell Level  THYROID & PARATHYROID SURGERY:  POST-OP INSTRUCTIONS  Always review the instruction sheet provided by the hospital nurse at discharge.  A prescription for pain medication may be sent to your pharmacy at the time of discharge.  Take your pain medication as prescribed.  If narcotic pain medicine is not needed, then you may take acetaminophen (Tylenol) or ibuprofen (Advil) as needed for pain or soreness.  Take your normal home medications as prescribed unless otherwise directed.  If you need a refill on your pain medication, please contact the office during regular business hours.  Prescriptions will not be processed by the office after 5:00PM or on weekends.  Start with a light diet upon arrival home, such as soup and crackers or toast.  Be sure to drink plenty of fluids.  Resume your normal diet the day after surgery.  Most patients will experience some swelling and bruising on the chest and neck area.  Ice packs will help for the first 48 hours after arriving home.  Swelling and bruising will take several days to resolve.   It is common to experience some constipation after surgery.  Increasing fluid intake and taking a stool softener (Colace) will usually help to prevent this problem.  A mild laxative (Milk of Magnesia or Miralax) should be taken according to package directions if there has been no bowel movement after 48 hours.  Dermabond glue covers your incision. This seals the wound and you may shower at any time. The Dermabond will remain in place for about a week.  You may gradually remove the glue when it loosens around the edges.  If you need to loosen the Dermabond for removal, apply a layer of Vaseline to the wound for 15 minutes and then remove with a Kleenex. Your sutures are under the skin and will not show - they will dissolve on their own.  You may resume light daily activities beginning the day after discharge (such as self-care,  walking, climbing stairs), gradually increasing activities as tolerated. You may have sexual intercourse when it is comfortable. Refrain from any heavy lifting or straining until approved by your doctor. You may drive when you no longer are taking prescription pain medication, you can comfortably wear a seatbelt, and you can safely maneuver your car and apply the brakes.  You will see your doctor in the office for a follow-up appointment approximately three weeks after your surgery.  Make sure that you call for this appointment within a day or two after you arrive home to insure a convenient appointment time. Please have any requested laboratory tests performed a few days prior to your office visit so that the results will be available at your follow up appointment.  WHEN TO CALL THE [facility] OFFICE: -- Fever greater than 101.5 -- Inability to urinate -- Nausea and/or vomiting - persistent -- Extreme swelling or bruising -- Continued bleeding from incision -- Increased pain, redness, or drainage from the incision -- Difficulty swallowing or breathing -- Muscle cramping or spasms -- Numbness or tingling in hands or around lips  The clinic staff is available to answer your questions during regular business hours.  Please don't hesitate to call and ask to speak to one of the nurses if you have concerns.  [facility] OFFICE: [phone] (24 hours)  Please sign up for MyChart accounts. This will allow you to communicate directly with my nurse or myself without having to call the office. It will also allow you  to view your test results. You will need to enroll in MyChart for my office ([facility]) and for the hospital [facility]).  Darnell Level, MD [facility] A [facility] practice

## 2023-06-17 NOTE — Anesthesia Preprocedure Evaluation (Addendum)
Anesthesia Evaluation  Patient identified by MRN, date of birth, ID band Patient awake    Reviewed: Allergy & Precautions, H&P , NPO status , Patient's Chart, lab work & pertinent test results, reviewed documented beta blocker date and time   Airway Mallampati: II  TM Distance: >3 FB Neck ROM: Full    Dental no notable dental hx. (+) Teeth Intact, Dental Advisory Given   Pulmonary former smoker   Pulmonary exam normal breath sounds clear to auscultation       Cardiovascular hypertension, Pt. on medications and Pt. on home beta blockers  Rhythm:Regular Rate:Normal     Neuro/Psych   Anxiety     negative neurological ROS  negative psych ROS   GI/Hepatic negative GI ROS, Neg liver ROS,,,  Endo/Other  negative endocrine ROS    Renal/GU negative Renal ROS  negative genitourinary   Musculoskeletal   Abdominal   Peds  Hematology negative hematology ROS (+)   Anesthesia Other Findings   Reproductive/Obstetrics negative OB ROS                             Anesthesia Physical Anesthesia Plan  ASA: 3  Anesthesia Plan: General   Post-op Pain Management: Tylenol PO (pre-op)*   Induction: Intravenous  PONV Risk Score and Plan: 4 or greater and Ondansetron and Dexamethasone  Airway Management Planned: Oral ETT  Additional Equipment:   Intra-op Plan:   Post-operative Plan: Extubation in OR  Informed Consent: I have reviewed the patients History and Physical, chart, labs and discussed the procedure including the risks, benefits and alternatives for the proposed anesthesia with the patient or authorized representative who has indicated his/her understanding and acceptance.     Dental advisory given  Plan Discussed with: CRNA  Anesthesia Plan Comments:        Anesthesia Quick Evaluation

## 2023-06-18 ENCOUNTER — Encounter (HOSPITAL_COMMUNITY): Payer: Self-pay | Admitting: Surgery

## 2023-06-20 LAB — SURGICAL PATHOLOGY

## 2023-06-29 ENCOUNTER — Encounter: Payer: Medicare Other | Admitting: Gastroenterology

## 2023-07-18 ENCOUNTER — Other Ambulatory Visit: Payer: Self-pay | Admitting: Family Medicine

## 2023-07-25 ENCOUNTER — Telehealth: Payer: Self-pay

## 2023-07-25 NOTE — Telephone Encounter (Signed)
 Telephone call to patient because she was on RN's schedule for a previsit today.  Patient completed a previsit on 06/10/23.  Colonoscopy date was changed to 08/03/23 (6 weeks+ post-op).  RN informed patient she does not need another previsit today, but if she would like RN to go through everything with her again, RN would.  She declined another pre-visit today.    New instructions sent to patient via my chart and mail.  RN answered all questions and she denies further needs. SChaplin, RN,BSN

## 2023-08-03 ENCOUNTER — Encounter: Payer: Self-pay | Admitting: Gastroenterology

## 2023-08-03 ENCOUNTER — Ambulatory Visit: Payer: Medicare Other | Admitting: Gastroenterology

## 2023-08-03 VITALS — BP 123/90 | HR 66 | Temp 97.6°F | Resp 16 | Ht 67.0 in | Wt 123.4 lb

## 2023-08-03 DIAGNOSIS — Z1211 Encounter for screening for malignant neoplasm of colon: Secondary | ICD-10-CM

## 2023-08-03 DIAGNOSIS — K5730 Diverticulosis of large intestine without perforation or abscess without bleeding: Secondary | ICD-10-CM | POA: Diagnosis not present

## 2023-08-03 DIAGNOSIS — D122 Benign neoplasm of ascending colon: Secondary | ICD-10-CM

## 2023-08-03 DIAGNOSIS — K648 Other hemorrhoids: Secondary | ICD-10-CM | POA: Diagnosis not present

## 2023-08-03 DIAGNOSIS — K635 Polyp of colon: Secondary | ICD-10-CM | POA: Diagnosis not present

## 2023-08-03 DIAGNOSIS — K644 Residual hemorrhoidal skin tags: Secondary | ICD-10-CM | POA: Diagnosis not present

## 2023-08-03 DIAGNOSIS — D128 Benign neoplasm of rectum: Secondary | ICD-10-CM

## 2023-08-03 MED ORDER — SODIUM CHLORIDE 0.9 % IV SOLN: 500.0000 mL | Freq: Once | INTRAVENOUS | Status: DC

## 2023-08-03 NOTE — Progress Notes (Unsigned)
 Pt's states no medical or surgical changes since previsit or office visit.

## 2023-08-03 NOTE — Progress Notes (Unsigned)
 Vss nad trans to pacu

## 2023-08-03 NOTE — Progress Notes (Unsigned)
 [facility] History and Physical   Primary Care Physician:  Excell Seltzer, MD   Reason for Procedure:  Colorectal cancer screening  Plan:    Screening colonoscopy with possible interventions as needed     HPI: Candice Gray is a very pleasant 68 y.o. female here for screening colonoscopy. Denies any nausea, vomiting, abdominal pain, melena or bright red blood per rectum  The risks and benefits as well as alternatives of endoscopic procedure(s) have been discussed and reviewed. All questions answered. The patient agrees to proceed.    Past Medical History:  Diagnosis Date   Anxiety    Dermatitis, seborrheic 2018   Deviated nasal septum    Diverticulitis    Diverticulosis    Glaucoma    HTN (hypertension)    Hyperlipemia    Hyperplastic colon polyp    Hyponatremia    Hyponatremia    MVP (mitral valve prolapse)    Osteoporosis    Parathyroid abnormality (HCC)    Pneumonia    Pre-diabetes    Raynaud phenomenon    Seizures (HCC)    SIADH (syndrome of inappropriate ADH production) (HCC)    Thyroid disease     Past Surgical History:  Procedure Laterality Date   CATARACT EXTRACTION, BILATERAL  2019   CRYOTHERAPY     for cervical dysplasia   NM MYOCAR PERF WALL MOTION  11/2007   bruce myoview; perfusion defect in anterior myocardium (breast attenuation); post-stress EF 77%; normal, low risk study    PARATHYROIDECTOMY Right 06/17/2023   Procedure: RIGHT INFERIOR PARATHYROIDECTOMY;  Surgeon: Darnell Level, MD;  Location: [facility];  Service: General;  Laterality: Right;   TRANSTHORACIC ECHOCARDIOGRAM  11/2007   EF=>55%; mild MR; trace TR   TUBAL LIGATION      Prior to Admission medications   Medication Sig Start Date End Date Taking? Authorizing Provider  amLODipine (NORVASC) 2.5 MG tablet TAKE 1 TABLET BY MOUTH TWICE DAILY 07/18/23  Yes Bedsole, Amy E, MD  atorvastatin (LIPITOR) 10 MG tablet TAKE ONE TABLET BY MOUTH EVERY MONDAY, WEDNESDAY, AND FRIDAY  07/18/23  Yes Bedsole, Amy E, MD  lamoTRIgine (LAMICTAL) 100 MG tablet Take 1 tablet (100 mg total) by mouth 2 (two) times daily. 11/01/22  Yes Glean Salvo, NP  latanoprost (XALATAN) 0.005 % ophthalmic solution Place 1 drop into both eyes at bedtime.   Yes [provider]  metoprolol tartrate (LOPRESSOR) 25 MG tablet TAKE 1/2 TABLET BY MOUTH TWICE DAILY 06/07/23  Yes Bedsole, Amy E, MD  hydrocortisone 2.5 % cream Apply 1 application  topically daily as needed (facial break outs).    [provider]  traMADol (ULTRAM) 50 MG tablet Take 1 tablet (50 mg total) by mouth every 6 (six) hours as needed for moderate pain (pain score 4-6). 06/17/23   Darnell Level, MD  zoledronic acid (RECLAST) 5 MG/100ML SOLN injection Inject 5 mg into the vein See admin instructions. Once a year    [provider]    Current Outpatient Medications  Medication Sig Dispense Refill   amLODipine (NORVASC) 2.5 MG tablet TAKE 1 TABLET BY MOUTH TWICE DAILY 180 tablet 3   atorvastatin (LIPITOR) 10 MG tablet TAKE ONE TABLET BY MOUTH EVERY MONDAY, WEDNESDAY, AND FRIDAY 36 tablet 3   lamoTRIgine (LAMICTAL) 100 MG tablet Take 1 tablet (100 mg total) by mouth 2 (two) times daily. 180 tablet 4   latanoprost (XALATAN) 0.005 % ophthalmic solution Place 1 drop into both eyes at bedtime.  metoprolol tartrate (LOPRESSOR) 25 MG tablet TAKE 1/2 TABLET BY MOUTH TWICE DAILY 90 tablet 3   hydrocortisone 2.5 % cream Apply 1 application  topically daily as needed (facial break outs).     traMADol (ULTRAM) 50 MG tablet Take 1 tablet (50 mg total) by mouth every 6 (six) hours as needed for moderate pain (pain score 4-6). 12 tablet 0   zoledronic acid (RECLAST) 5 MG/100ML SOLN injection Inject 5 mg into the vein See admin instructions. Once a year     Current Facility-Administered Medications  Medication Dose Route Frequency Provider Last Rate Last Admin   0.9 %  sodium chloride infusion  500 mL Intravenous Once  Napoleon Form, MD        Allergies as of 08/03/2023 - Review Complete 08/03/2023  Allergen Reaction Noted   Azelastine hcl Other (See Comments)    Depo-medrol [methylprednisolone] Itching 03/25/2017   Miralax [polyethylene glycol] Itching 01/09/2013    Family History  Problem Relation Age of Onset   Transient ischemic attack Mother    Dementia Mother    Hyperlipidemia Father    Hypertension Father    Diabetes Father    Atrial fibrillation Father    Hyperlipidemia Brother    Coronary artery disease Maternal Grandmother    Diabetes Maternal Grandmother    Diabetes Paternal Grandmother    Cancer Paternal Grandfather        brain tumor   Hyperlipidemia Other    Hypertension Other    Colon cancer Neg Hx    Esophageal cancer Neg Hx    Rectal cancer Neg Hx    Stomach cancer Neg Hx    Colon polyps Neg Hx     Social History   Socioeconomic History   Marital status: Married    Spouse name: Casimiro Needle   Number of children: 2   Years of education: [employer] education level: Bachelor's degree (e.g., BA, AB, BS)  Occupational History   Occupation: retired    Associate Professor: UNEMPLOYED  Tobacco Use   Smoking status: Former    Current packs/day: 0.00    Types: Cigarettes    Quit date: 06/05/1998    Years since quitting: 25.1    Passive exposure: Never   Smokeless tobacco: Never  Vaping Use   Vaping status: Never Used  Substance and Sexual Activity   Alcohol use: Yes    Alcohol/week: 4.0 standard drinks of alcohol    Types: 4 Cans of beer per week    Comment: daily   Drug use: Yes    Types: Marijuana    Comment: 04/11/18 yes marijuana   Sexual activity: Not on file  Other Topics Concern   Not on file  Social History Narrative   Lives at husband and 2 sons   Right-handed   Regular exercise--yes   Caffeine: daily coffee   Diet: fruits and veggies   Social Drivers of Corporate investment banker Strain: Low Risk  (04/25/2023)   Overall Financial Resource  Strain (CARDIA)    Difficulty of Paying Living Expenses: Not hard at all  Food Insecurity: No Food Insecurity (04/25/2023)   Hunger Vital Sign    Worried About Running Out of Food in the Last Year: Never true    Ran Out of Food in the Last Year: Never true  Transportation Needs: No Transportation Needs (04/25/2023)   PRAPARE - Administrator, Civil Service (Medical): No    Lack of Transportation (Non-Medical): No  Physical Activity: Insufficiently  Active (04/25/2023)   Exercise Vital Sign    Days of Exercise per Week: 4 days    Minutes of Exercise per Session: 20 min  Stress: No Stress Concern Present (04/25/2023)   [employer] of Occupational Health - Occupational Stress Questionnaire    Feeling of Stress : Not at all  Social Connections: Unknown (04/25/2023)   Social Connection and Isolation Panel [NHANES]    Frequency of Communication with Friends and Family: Twice a week    Frequency of Social Gatherings with Friends and Family: Patient declined    Attends Religious Services: Never    Database administrator or Organizations: No    Attends Banker Meetings: Never    Marital Status: Married  Catering manager Violence: Not At Risk (04/27/2023)   Humiliation, Afraid, Rape, and Kick questionnaire    Fear of Current or Ex-Partner: No    Emotionally Abused: No    Physically Abused: No    Sexually Abused: No    Review of Systems:  All other review of systems negative except as mentioned in the HPI.  Physical Exam: Vital signs in last 24 hours: BP (!) 152/84   Pulse 70   Temp 97.6 F (36.4 C) (Temporal)   Resp 12   Ht 5\' 7"  (1.702 m)   Wt 123 lb 6.4 oz (56 kg)   SpO2 100%   BMI 19.33 kg/m  General:   Alert, NAD Lungs:  Clear .   Heart:  Regular rate and rhythm Abdomen:  Soft, nontender and nondistended. Neuro/Psych:  Alert and cooperative. Normal mood and affect. A and O x 3  Reviewed labs, radiology imaging, old records and pertinent past GI  work up  Patient is appropriate for planned procedure(s) and anesthesia in an ambulatory setting   K. Scherry Ran , MD [phone]

## 2023-08-03 NOTE — Patient Instructions (Signed)

## 2023-08-03 NOTE — Op Note (Signed)
 [facility] Patient Name: Candice Gray Procedure Date: 08/03/2023 9:55 AM MRN: [id number] Endoscopist: Napoleon Form , MD, [id number] Age: 68 Referring MD:  Date of Birth: 1955-11-05 Gender: Female Account #: [id number] Procedure:                Colonoscopy Indications:              Screening for colorectal malignant neoplasm Medicines:                Monitored Anesthesia Care Procedure:                Pre-Anesthesia Assessment:                           - Prior to the procedure, a History and Physical                            was performed, and patient medications and                            allergies were reviewed. The patient's tolerance of                            previous anesthesia was also reviewed. The risks                            and benefits of the procedure and the sedation                            options and risks were discussed with the patient.                            All questions were answered, and informed consent                            was obtained. Prior Anticoagulants: The patient has                            taken no anticoagulant or antiplatelet agents. ASA                            Grade Assessment: II - A patient with mild systemic                            disease. After reviewing the risks and benefits,                            the patient was deemed in satisfactory condition to                            undergo the procedure.                           After obtaining informed consent, the colonoscope  was passed under direct vision. Throughout the                            procedure, the patient's blood pressure, pulse, and                            oxygen saturations were monitored continuously. The                            Olympus Scope SN: [id number] was introduced through                            the anus and advanced to the the cecum, identified                            by  appendiceal orifice and ileocecal valve. The                            [id number]-[id number] Slim SN [id number] was introduced through                            the and advanced to the. The patient tolerated the                            procedure well. The quality of the bowel                            preparation was adequate. The ileocecal valve,                            appendiceal orifice, and rectum were photographed.                            The colonoscopy was technically difficult and                            complex due to multiple diverticula in the colon                            and restricted mobility of the colon. Successful                            completion of the procedure was aided by                            withdrawing the scope and replacing with the                            UltraSlim scope. Scope In: 10:04:59 AM Scope Out: 10:51:54 AM Scope Withdrawal Time: 0 hours 18 minutes 7 seconds  Total Procedure Duration: 0 hours 46 minutes 55 seconds  Findings:                 Hemorrhoids were found on perianal exam.  A 12 mm polyp was found in the rectum. The polyp                            was semi-pedunculated. The polyp was removed with a                            hot snare. Resection and retrieval were complete.                           Two sessile polyps were found in the ascending                            colon. The polyps were 3 to 7 mm in size. These                            polyps were removed with a cold snare. Resection                            was complete, but the polyp tissue was only                            partially retrieved.                           Scattered large-mouthed, medium-mouthed and                            small-mouthed diverticula were found in the sigmoid                            colon, descending colon, transverse colon,                            ascending colon and cecum. There was narrowing of                             the colon in association with the diverticular                            opening. There was evidence of diverticular spasm.                            Peri-diverticular erythema was seen.                           Non-bleeding external and internal hemorrhoids were                            found during retroflexion. The hemorrhoids were                            medium-sized. Complications:            No immediate complications. Estimated Blood Loss:     Estimated blood loss  was minimal. Impression:               - Hemorrhoids found on perianal exam.                           - One 12 mm polyp in the rectum, removed with a hot                            snare. Resected and retrieved.                           - Two 3 to 7 mm polyps in the ascending colon,                            removed with a cold snare. Complete resection.                            Partial retrieval.                           - Severe diverticulosis in the sigmoid colon, in                            the descending colon, in the transverse colon, in                            the ascending colon and in the cecum. There was                            narrowing of the colon in association with the                            diverticular opening. There was evidence of                            diverticular spasm. Peri-diverticular erythema was                            seen.                           - Non-bleeding external and internal hemorrhoids. Recommendation:           - Patient has a contact number available for                            emergencies. The signs and symptoms of potential                            delayed complications were discussed with the                            patient. Return to normal activities tomorrow.  Written discharge instructions were provided to the                            patient.                           - Resume  previous diet.                           - Continue present medications.                           - Await pathology results.                           - Repeat colonoscopy in 3 - 5 years for                            surveillance based on pathology results. Napoleon Form, MD 08/03/2023 10:59:45 AM This report has been signed electronically.

## 2023-08-04 ENCOUNTER — Telehealth: Payer: Self-pay

## 2023-08-04 NOTE — Telephone Encounter (Signed)
  Follow up Call-     08/03/2023    9:05 AM  Call back number  Post procedure Call Back phone  # [phone]  Permission to leave phone message Yes     Left message

## 2023-08-05 LAB — SURGICAL PATHOLOGY

## 2023-09-29 ENCOUNTER — Encounter: Payer: Self-pay | Admitting: Gastroenterology

## 2023-10-12 ENCOUNTER — Other Ambulatory Visit: Payer: Self-pay | Admitting: Family Medicine

## 2023-10-12 ENCOUNTER — Telehealth: Payer: Self-pay | Admitting: *Deleted

## 2023-10-12 DIAGNOSIS — E213 Hyperparathyroidism, unspecified: Secondary | ICD-10-CM

## 2023-10-12 DIAGNOSIS — E785 Hyperlipidemia, unspecified: Secondary | ICD-10-CM

## 2023-10-12 DIAGNOSIS — R7303 Prediabetes: Secondary | ICD-10-CM

## 2023-10-12 DIAGNOSIS — E222 Syndrome of inappropriate secretion of antidiuretic hormone: Secondary | ICD-10-CM

## 2023-10-12 NOTE — Telephone Encounter (Signed)
 Copied from CRM [id number]. Topic: Appointments - Appointment Scheduling >> Oct 12, 2023  1:27 PM Howard Macho wrote: Patient/patient representative is calling to schedule an appointment. Refer to attachments for appointment information.  Patient called to make a physical appointment and she wanted to make a lab appointment to but there are no lab orders in

## 2023-10-12 NOTE — Telephone Encounter (Signed)
 Please call and schedule fasting lab appointment.  Future lab orders now in Epic.

## 2023-10-24 NOTE — Progress Notes (Signed)
 PATIENT: Candice Gray DOB: [birth year]  REASON FOR VISIT: follow up HISTORY FROM: patient PRIMARY NEUROLOGIST: Dr. Gracie Lav   HISTORY OF PRESENT ILLNESS: Candice Gray is a 68 years old right-handed female, seen in refer by her primary care doctor  Judithann Novas for evaluation of seizure on March 25 2016, initial evaluation was on April 01 2016.   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   She was put on water restriction 64 ounces daily ever since, is under close supervision of endocrinologist, she reported frequent thirsty recently, has liberated her water intake some,   On March 25 2016 around 10:00 in the morning, she noticed mild dizziness, sat down in the chair, lost consciousness, woke up on ambulance confused, she has a witnessed tonic-clonic seizure activity, her body becoming rigid, mouth open, eyes rolled back, lasting for a few minutes, followed by post event confusion.    I personally reviewed MRI of the brain on March 25 2016, mild generalized atrophy no acute abnormality    I reviewed her laboratory evaluation in November, glucose was 123, sodium was 129, potassium was 3.2, chloride was 90, glucose was 118, creatinine 0.78, WBC was mildly elevated 13, hemoglobin was 15 point 1, magnesium  1.6, A1c was 5.5, normal TSH, BNP was 112, magnesium  1.8, UDS was positive for marijuana, alcohol was less than 5, negative hepatitis C,    EEG showed: posterior dominant rhythm of 9 Hz reactive eye opening and closure., there are epileptiform discharges per description with maximum at F7 and T3. These do not evolve into electrographic seizures.  No sleep was recorded.   She was treated with Keppra  500 mg 3 times a day, she complains of being anxious about her health, get irritated easily, she does smoke marijuana regularly,  now she has stopped smoking.   UPDATE Sep 29 2016:YY She is doing well, able to tolerate lamotrigine  100 mg twice a day much better, no recurrent seizure,  UPDATE October 30 2019: She is overall doing well, there was no recurrent seizure, tolerating lamotrigine  100 mg twice a day, still under close supervision of her primary care for hyponatremia, under water restriction of 64 ounce daily, she still use marijuana every day  Most recent laboratory evaluation November 2020, A1c 5.7, CMP showed mildly low sodium 132, chloride 93, calcium  10.8, lipid panel elevated cholesterol 231, LDL 100  Update October 29, 2020 SS: Reports last Friday night, 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 Spell last year after mother died, pure exhaustion from planning service, babbling talking to friend, got something to eat, felt much better. Remains on Lamictal  100 mg twice daily. BP was higher last week than normal, can sometimes indicate sodium level was low. Sees endocrinology at [facility]. Is on fluid restriction, 64 oz is daily limit, generous with salt. Sodium 131 in Nov 2021. Only 1 seizure ever, not really sure what to look for with seizures.  Update 10/29/21 SS: Things are going well, no seizures, remains on Lamictal  100 mg twice daily,  Labs 03/04/21 sodium 132, chloride 94. Sees endocrinology annually. Limits her water intake. Is pleased she has gained 5 lbs.   Update November 01, 2022 SS: Labs 10/29/21 Lamictal  level 6.6, sodium 132.  No seizures. Remains on Lamictal  100 mg BID. Sodium level stays in the 130's. Remains on fluid restriction. Sees endocrinology annually. Has struggled to tolerate Lipitor, LDL 108. Takes every other day.    Update October 25, 2023 SS: Labs last visit sodium 132, Lamictal  7.5. Had parathyroidectomy in Jan, presented as having leg pain. No seizures. Has follow up with endocrinology next week, will  check labs.   REVIEW OF SYSTEMS: Out of a complete 14 system review of symptoms, the patient complains only of the following symptoms, and all other reviewed systems are negative.  See HPI  ALLERGIES: Allergies  Allergen Reactions   Azelastine Hcl Other (See Comments)    REACTION: increased bp   Depo-Medrol  [Methylprednisolone ] Itching   Miralax [Polyethylene Glycol] Itching    HOME MEDICATIONS: Outpatient Medications Prior to Visit  Medication Sig Dispense Refill   amLODipine  (NORVASC ) 2.5 MG tablet TAKE 1 TABLET BY MOUTH TWICE DAILY 180 tablet 3   hydrocortisone 2.5 % cream Apply 1 application  topically daily as needed (facial break outs).     latanoprost  (XALATAN ) 0.005 % ophthalmic solution Place 1 drop into both eyes at bedtime.     metoprolol  tartrate (LOPRESSOR ) 25 MG tablet TAKE 1/2 TABLET BY MOUTH TWICE DAILY 90 tablet 3   zoledronic  acid (RECLAST ) 5 MG/100ML SOLN injection Inject 5 mg into the vein See admin instructions. Once a year     lamoTRIgine  (LAMICTAL ) 100 MG tablet Take 1 tablet (100 mg total) by mouth 2 (two) times daily. 180 tablet 4   atorvastatin  (LIPITOR) 10 MG tablet TAKE ONE TABLET BY MOUTH EVERY MONDAY, WEDNESDAY, AND FRIDAY (Patient not taking: Reported on 10/25/2023) 36 tablet 3   No facility-administered medications prior to visit.    PAST MEDICAL HISTORY: Past Medical History:  Diagnosis Date   Anxiety    Dermatitis, seborrheic 2018   Deviated nasal septum    Diverticulitis    Diverticulosis    Glaucoma    HTN (hypertension)    Hyperlipemia    Hyperplastic colon polyp    Hyponatremia    Hyponatremia    MVP (mitral valve prolapse)    Osteoporosis    Parathyroid  abnormality (HCC)    Pneumonia    Pre-diabetes    Raynaud phenomenon    Seizures (HCC)    SIADH (syndrome of inappropriate ADH production) (HCC)    Thyroid  disease     PAST SURGICAL HISTORY: Past Surgical History:  Procedure Laterality Date   CATARACT EXTRACTION, BILATERAL   2019   CRYOTHERAPY     for cervical dysplasia   NM MYOCAR PERF WALL MOTION  11/2007   bruce myoview; perfusion defect in anterior myocardium (breast attenuation); post-stress EF 77%; normal, low risk study    PARATHYROIDECTOMY Right 06/17/2023   Procedure: RIGHT INFERIOR PARATHYROIDECTOMY;  Surgeon: Oralee Billow, MD;  Location: [facility];  Service: General;  Laterality: Right;   TRANSTHORACIC ECHOCARDIOGRAM  11/2007   EF=>55%; mild MR; trace TR   TUBAL LIGATION      FAMILY HISTORY: Family History  Problem Relation Age of Onset   Transient ischemic attack Mother    Dementia Mother    Hyperlipidemia Father    Hypertension Father    Diabetes Father    Atrial fibrillation Father    Hyperlipidemia Brother    Coronary artery disease Maternal Grandmother    Diabetes Maternal Grandmother    Diabetes Paternal Grandmother    Cancer Paternal Grandfather        brain tumor  Hyperlipidemia Other    Hypertension Other    Colon cancer Neg Hx    Esophageal cancer Neg Hx    Rectal cancer Neg Hx    Stomach cancer Neg Hx    Colon polyps Neg Hx     SOCIAL HISTORY: Social History   Socioeconomic History   Marital status: Married    Spouse name: Bambi Lever   Number of children: 2   Years of education: [employer] education level: Bachelor's degree (e.g., BA, AB, BS)  Occupational History   Occupation: retired    Associate Professor: UNEMPLOYED  Tobacco Use   Smoking status: Former    Current packs/day: 0.00    Types: Cigarettes    Quit date: 06/05/1998    Years since quitting: 25.4    Passive exposure: Never   Smokeless tobacco: Never  Vaping Use   Vaping status: Never Used  Substance and Sexual Activity   Alcohol use: Yes    Alcohol/week: 4.0 standard drinks of alcohol    Types: 4 Cans of beer per week    Comment: daily   Drug use: Yes    Types: Marijuana    Comment: 04/11/18 yes marijuana   Sexual activity: Not on file  Other Topics Concern   Not on file  Social History Narrative    Lives at husband and 2 sons   Right-handed   Regular exercise--yes   Caffeine: daily coffee   Diet: fruits and veggies   Social Drivers of Corporate investment banker Strain: Low Risk  (04/25/2023)   Overall Financial Resource Strain (CARDIA)    Difficulty of Paying Living Expenses: Not hard at all  Food Insecurity: No Food Insecurity (04/25/2023)   Hunger Vital Sign    Worried About Running Out of Food in the Last Year: Never true    Ran Out of Food in the Last Year: Never true  Transportation Needs: No Transportation Needs (04/25/2023)   PRAPARE - Administrator, Civil Service (Medical): No    Lack of Transportation (Non-Medical): No  Physical Activity: Insufficiently Active (04/25/2023)   Exercise Vital Sign    Days of Exercise per Week: 4 days    Minutes of Exercise per Session: 20 min  Stress: No Stress Concern Present (04/25/2023)   [employer] of Occupational Health - Occupational Stress Questionnaire    Feeling of Stress : Not at all  Social Connections: Unknown (04/25/2023)   Social Connection and Isolation Panel [NHANES]    Frequency of Communication with Friends and Family: Twice a week    Frequency of Social Gatherings with Friends and Family: Patient declined    Attends Religious Services: Not on Insurance claims handler of Clubs or Organizations: No    Attends Banker Meetings: Never    Marital Status: Married  Catering manager Violence: Not At Risk (04/27/2023)   Humiliation, Afraid, Rape, and Kick questionnaire    Fear of Current or Ex-Partner: No    Emotionally Abused: No    Physically Abused: No    Sexually Abused: No   PHYSICAL EXAM  Vitals:   10/25/23 1043  BP: 124/74  Weight: 125 lb (56.7 kg)  Height: 5\' 8"  (1.727 m)   Body mass index is 19.01 kg/m.  Generalized: Well developed, in no acute distress   Neurological examination  Mentation: Alert oriented to time, place, history taking. Follows all commands speech and  language fluent Cranial nerve II-XII: Pupils were equal round reactive to light. Extraocular  movements were full, visual field were full on confrontational test. Facial sensation and strength were normal. Head turning and shoulder shrug were normal and symmetric. Motor: 5/5 strength bilaterally  Sensory: Sensory testing is intact to light touch Coordination: Finger nose finger and heel to shin was normal bilaterally  Gait and station: Gait is normal.   DIAGNOSTIC DATA (LABS, IMAGING, TESTING) - I reviewed patient records, labs, notes, testing and imaging myself where available.  Lab Results  Component Value Date   WBC 10.5 06/08/2023   HGB 13.6 06/08/2023   HCT 41.6 06/08/2023   MCV 94.8 06/08/2023   PLT 321 06/08/2023      Component Value Date/Time   NA 129 (L) 06/08/2023 1041   NA 132 (L) 11/01/2022 0808   K 4.0 06/08/2023 1041   CL 93 (L) 06/08/2023 1041   CO2 26 06/08/2023 1041   GLUCOSE 111 (H) 06/08/2023 1041   BUN 13 06/08/2023 1041   BUN 9 11/01/2022 0808   CREATININE 0.61 06/08/2023 1041   CALCIUM  9.9 06/08/2023 1041   PROT 7.4 04/29/2023 1027   PROT 6.7 11/01/2022 0808   ALBUMIN  4.3 04/29/2023 1027   ALBUMIN  4.0 11/01/2022 0808   AST 13 04/29/2023 1027   ALT 10 04/29/2023 1027   ALKPHOS 87 04/29/2023 1027   BILITOT 0.5 04/29/2023 1027   BILITOT 0.3 11/01/2022 0808   GFRNONAA >60 06/08/2023 1041   GFRAA 107 10/31/2018 1040   Lab Results  Component Value Date   CHOL 202 (H) 04/18/2023   HDL 100.10 04/18/2023   LDLCALC 90 04/18/2023   LDLDIRECT 74.6 12/31/2011   TRIG 55.0 04/18/2023   CHOLHDL 2 04/18/2023   Lab Results  Component Value Date   HGBA1C 6.3 04/18/2023   Lab Results  Component Value Date   VITAMINB12 390 12/20/2012   Lab Results  Component Value Date   TSH 2.96 02/08/2023    ASSESSMENT AND PLAN 68 y.o. year old female  1. Seizure on March 25, 2016 2.  Hyperparathyroidism, post parathyroidectomy   - Continues to do well, no  recurrent seizure, continue Lamictal  100 mg BID - Seeing endocrinology next week, will update labs, specifically sodium  -Labs June 2024 Lamictal  level 7.5, sodium 132, - Seizure episode occurred in setting of hyponatremia in 2017, sodium level was 129, continues to follow with endocrinology, on fluid restriction -MRI of the brain showed generalized atrophy, no acute abnormality -EEG showed focal slowing of left to mid temporal lobe -Follow-up in 1 year or sooner if needed, 15 min VV  Jeanmarie Millet, Maritza Sidles, DNP  [facility] [address] [place], [place] [address] [phone]

## 2023-10-25 ENCOUNTER — Encounter: Payer: Self-pay | Admitting: Neurology

## 2023-10-25 ENCOUNTER — Ambulatory Visit (INDEPENDENT_AMBULATORY_CARE_PROVIDER_SITE_OTHER): Admitting: Neurology

## 2023-10-25 VITALS — BP 124/74 | Ht 68.0 in | Wt 125.0 lb

## 2023-10-25 DIAGNOSIS — R569 Unspecified convulsions: Secondary | ICD-10-CM

## 2023-10-25 MED ORDER — LAMOTRIGINE 100 MG PO TABS: 100.0000 mg | ORAL_TABLET | Freq: Two times a day (BID) | ORAL | 4 refills | Status: AC

## 2023-10-25 NOTE — Patient Instructions (Signed)
 Great to see you today.  Continue Lamictal  for seizure prevention.  Please make sure endocrinology checks labs next week to simply sodium level.  Please reach out if the labs are abnormal.  Call for seizure activity.  Will follow-up in 1 year virtually.  Thanks!!

## 2023-11-08 ENCOUNTER — Ambulatory Visit: Payer: Medicare Other | Admitting: Neurology

## 2024-01-02 ENCOUNTER — Telehealth: Payer: Self-pay | Admitting: *Deleted

## 2024-01-02 NOTE — Telephone Encounter (Signed)
 Spoke with Candice Gray and advised that we have not received any labs from her endocrinologist.  She will try to get them and upload to her MyChart.

## 2024-01-02 NOTE — Telephone Encounter (Signed)
 Copied from CRM #[id number]. Topic: General - Other >> Jan 02, 2024  2:23 PM Thersia BROCKS wrote: Reason for CRM: Patient called in regarding her endocrinology results, stated they have sent them over to [facility] to review,  would like for a nurse to give her a callback,

## 2024-01-02 NOTE — Telephone Encounter (Signed)
 Copied from CRM #[id number]. Topic: General - Other >> Jan 02, 2024  3:51 PM Jasmin G wrote: Reason for CRM: Pt called regarding recent phone encounter with Ms. Wendell Race, she is trying to find out what would be the best email to send her lab results to since she can't upload them through MyChart (Please refer to recent encounter at 3:02 p.m), please message through MyChart to let her know.

## 2024-01-03 NOTE — Telephone Encounter (Signed)
 Labs for Endo printed and placed in Dr. Sherrel office in box to review.

## 2024-03-27 LAB — HM MAMMOGRAPHY

## 2024-03-29 ENCOUNTER — Encounter: Payer: Self-pay | Admitting: Family Medicine

## 2024-03-29 ENCOUNTER — Ambulatory Visit: Payer: Self-pay | Admitting: Family Medicine

## 2024-04-24 ENCOUNTER — Other Ambulatory Visit

## 2024-04-27 ENCOUNTER — Ambulatory Visit (INDEPENDENT_AMBULATORY_CARE_PROVIDER_SITE_OTHER)

## 2024-04-27 ENCOUNTER — Other Ambulatory Visit (INDEPENDENT_AMBULATORY_CARE_PROVIDER_SITE_OTHER)

## 2024-04-27 ENCOUNTER — Ambulatory Visit: Payer: Self-pay | Admitting: Family Medicine

## 2024-04-27 VITALS — BP 128/78 | Ht 68.0 in | Wt 127.4 lb

## 2024-04-27 DIAGNOSIS — Z0000 Encounter for general adult medical examination without abnormal findings: Secondary | ICD-10-CM

## 2024-04-27 DIAGNOSIS — E213 Hyperparathyroidism, unspecified: Secondary | ICD-10-CM

## 2024-04-27 DIAGNOSIS — R7303 Prediabetes: Secondary | ICD-10-CM

## 2024-04-27 DIAGNOSIS — E785 Hyperlipidemia, unspecified: Secondary | ICD-10-CM | POA: Diagnosis not present

## 2024-04-27 DIAGNOSIS — E222 Syndrome of inappropriate secretion of antidiuretic hormone: Secondary | ICD-10-CM | POA: Diagnosis not present

## 2024-04-27 LAB — COMPREHENSIVE METABOLIC PANEL WITH GFR
ALT: 11 U/L (ref 0–35)
AST: 14 U/L (ref 0–37)
Albumin: 4.1 g/dL (ref 3.5–5.2)
Alkaline Phosphatase: 53 U/L (ref 39–117)
BUN: 17 mg/dL (ref 6–23)
CO2: 30 meq/L (ref 19–32)
Calcium: 9.6 mg/dL (ref 8.4–10.5)
Chloride: 94 meq/L — ABNORMAL LOW (ref 96–112)
Creatinine, Ser: 0.71 mg/dL (ref 0.40–1.20)
GFR: 87.26 mL/min (ref >60.00)
Glucose, Bld: 155 mg/dL — ABNORMAL HIGH (ref 70–99)
Potassium: 4.6 meq/L (ref 3.5–5.1)
Sodium: 132 meq/L — ABNORMAL LOW (ref 135–145)
Total Bilirubin: 0.4 mg/dL (ref 0.2–1.2)
Total Protein: 7 g/dL (ref 6.0–8.3)

## 2024-04-27 LAB — VITAMIN D 25 HYDROXY (VIT D DEFICIENCY, FRACTURES): VITD: 23.7 ng/mL — ABNORMAL LOW (ref 30.00–100.00)

## 2024-04-27 LAB — LIPID PANEL
Cholesterol: 193 mg/dL (ref 0–200)
HDL: 94.1 mg/dL (ref >39.00)
LDL Cholesterol: 90 mg/dL (ref 0–99)
NonHDL: 98.63
Total CHOL/HDL Ratio: 2
Triglycerides: 45 mg/dL (ref 0.0–149.0)
VLDL: 9 mg/dL (ref 0.0–40.0)

## 2024-04-27 LAB — HEMOGLOBIN A1C: Hgb A1c MFr Bld: 6.1 % (ref 4.6–6.5)

## 2024-04-27 NOTE — Progress Notes (Signed)
 Chief Complaint  Patient presents with   Medicare Wellness     Subjective:   Candice Gray is a 68 y.o. female who presents for a Medicare Annual Wellness Visit.  Visit info / Clinical Intake: Medicare Wellness Visit Type:: Subsequent Annual Wellness Visit Persons participating in visit and providing information:: patient Medicare Wellness Visit Mode:: In-person (required for WTM) Interpreter Needed?: No Pre-visit prep was completed: yes AWV questionnaire completed by patient prior to visit?: yes Date:: 04/24/24 Living arrangements:: (Patient-Rptd) lives with spouse/significant other Patient's Overall Health Status Rating: (Patient-Rptd) excellent Typical amount of pain: (Patient-Rptd) some Does pain affect daily life?: (Patient-Rptd) no Are you currently prescribed opioids?: no  Dietary Habits and Nutritional Risks How many meals a day?: (Patient-Rptd) 3 Eats fruit and vegetables daily?: (Patient-Rptd) yes Most meals are obtained by: (Patient-Rptd) preparing own meals In the last 2 weeks, have you had any of the following?: none Diabetic:: no  Functional Status Activities of Daily Living (to include ambulation/medication): (Patient-Rptd) Independent Ambulation: (Patient-Rptd) Independent Medication Administration: (Patient-Rptd) Independent Home Management (perform basic housework or laundry): (Patient-Rptd) Independent Manage your own finances?: (Patient-Rptd) yes Primary transportation is: (Patient-Rptd) driving Concerns about vision?: no *vision screening is required for WTM* Concerns about hearing?: no  Fall Screening Falls in the past year?: (Patient-Rptd) 0 Number of falls in past year: 0 Was there an injury with Fall?: 0 Fall Risk Category Calculator: 0 Patient Fall Risk Level: Low Fall Risk  Fall Risk Patient at Risk for Falls Due to: No Fall Risks Fall risk Follow up: Education provided; Falls prevention discussed  Home and Transportation Safety: All  rugs have non-skid backing?: (Patient-Rptd) yes All stairs or steps have railings?: (Patient-Rptd) yes Grab bars in the bathtub or shower?: (Patient-Rptd) yes Have non-skid surface in bathtub or shower?: (Patient-Rptd) yes Good home lighting?: (Patient-Rptd) yes Regular seat belt use?: (Patient-Rptd) yes Hospital stays in the last year:: (Patient-Rptd) no  Cognitive Assessment Difficulty concentrating, remembering, or making decisions? : (Patient-Rptd) no Will 6CIT or Mini Cog be Completed: yes What year is it?: 0 points What month is it?: 0 points Give patient an address phrase to remember (5 components): [address] [facility]  About what time is it?: 0 points Count backwards from 20 to 1: 0 points Say the months of the year in reverse: 0 points Repeat the address phrase from earlier: 0 points 6 CIT Score: 0 points  Advance Directives (For Healthcare) Does Patient Have a Medical Advance Directive?: Yes Does patient want to make changes to medical advance directive?: No - Patient declined Type of Advance Directive: Living will; Healthcare Power of Attorney Copy of Healthcare Power of Attorney in Chart?: No - copy requested Copy of Living Will in Chart?: No - copy requested  Reviewed/Updated  Reviewed/Updated: Reviewed All (Medical, Surgical, Family, Medications, Allergies, Care Teams, Patient Goals)    Allergies (verified) Azelastine hcl, Depo-medrol  [methylprednisolone ], and Miralax [polyethylene glycol (macrogol)]   Current Medications (verified) Outpatient Encounter Medications as of 04/27/2024  Medication Sig   amLODipine  (NORVASC ) 2.5 MG tablet TAKE 1 TABLET BY MOUTH TWICE DAILY   atorvastatin  (LIPITOR) 10 MG tablet TAKE ONE TABLET BY MOUTH EVERY MONDAY, WEDNESDAY, AND FRIDAY   hydrocortisone 2.5 % cream Apply 1 application  topically daily as needed (facial break outs).   lamoTRIgine  (LAMICTAL ) 100 MG tablet Take 1 tablet (100 mg total) by mouth 2 (two) times  daily.   latanoprost  (XALATAN ) 0.005 % ophthalmic solution Place 1 drop into both eyes at bedtime.  metoprolol  tartrate (LOPRESSOR ) 25 MG tablet TAKE 1/2 TABLET BY MOUTH TWICE DAILY   zoledronic  acid (RECLAST ) 5 MG/100ML SOLN injection Inject 5 mg into the vein See admin instructions. Once a year   No facility-administered encounter medications on file as of 04/27/2024.    History: Past Medical History:  Diagnosis Date   Anxiety    Dermatitis, seborrheic 2018   Deviated nasal septum    Diverticulitis    Diverticulosis    Glaucoma    Heart murmur 08/24/1991   Irregular heart beat, Mitral valve prolapse   HTN (hypertension)    Hyperlipemia    Hyperplastic colon polyp    Hyponatremia    Hyponatremia    MVP (mitral valve prolapse)    Osteoporosis    Parathyroid  abnormality    Pneumonia    Pre-diabetes    Raynaud phenomenon    Seizures (HCC)    SIADH (syndrome of inappropriate ADH production)    Thyroid  disease    Past Surgical History:  Procedure Laterality Date   CATARACT EXTRACTION, BILATERAL  2019   CRYOTHERAPY     for cervical dysplasia   EYE SURGERY  August-September , 2019   Cataract surgery, both eyes   NM MYOCAR PERF WALL MOTION  11/2007   bruce myoview; perfusion defect in anterior myocardium (breast attenuation); post-stress EF 77%; normal, low risk study    PARATHYROIDECTOMY Right 06/17/2023   Procedure: RIGHT INFERIOR PARATHYROIDECTOMY;  Surgeon: Eletha Boas, MD;  Location: [facility];  Service: General;  Laterality: Right;   TRANSTHORACIC ECHOCARDIOGRAM  11/2007   EF=>55%; mild MR; trace TR   TUBAL LIGATION     Family History  Problem Relation Age of Onset   Transient ischemic attack Mother    Dementia Mother    COPD Mother    Vision loss Mother    Varicose Veins Mother    Hyperlipidemia Father    Hypertension Father    Diabetes Father    Atrial fibrillation Father    Hearing loss Father    Vision loss Father    Hyperlipidemia Brother    Coronary  artery disease Maternal Grandmother    Diabetes Maternal Grandmother    Diabetes Paternal Grandmother    Cancer Paternal Grandfather        brain tumor   Hyperlipidemia Other    Hypertension Other    Diabetes Paternal Uncle    Hypertension Brother    Varicose Veins Maternal Aunt    Colon cancer Neg Hx    Esophageal cancer Neg Hx    Rectal cancer Neg Hx    Stomach cancer Neg Hx    Colon polyps Neg Hx    Social History   Occupational History   Occupation: retired    Associate Professor: UNEMPLOYED  Tobacco Use   Smoking status: Former    Current packs/day: 0.00    Types: Cigarettes    Quit date: 05/01/1999    Years since quitting: 25.0    Passive exposure: Never   Smokeless tobacco: Never  Vaping Use   Vaping status: Never Used  Substance and Sexual Activity   Alcohol use: Yes    Alcohol/week: 4.0 standard drinks of alcohol    Comment: daily   Drug use: Not Currently    Types: Marijuana    Comment: 04/11/18 yes marijuana   Sexual activity: Yes    Birth control/protection: Surgical    Comment: Married   Tobacco Counseling Counseling given: Not Answered  SDOH Screenings   Food Insecurity: No Food Insecurity (04/24/2024)  Housing: Unknown (04/24/2024)  Transportation Needs: No Transportation Needs (04/24/2024)  Utilities: Not At Risk (04/27/2024)  Alcohol Screen: Low Risk  (04/25/2023)  Depression (PHQ2-9): Low Risk  (04/27/2024)  Financial Resource Strain: Low Risk  (04/24/2024)  Physical Activity: Insufficiently Active (04/27/2024)  Social Connections: Unknown (04/24/2024)  Stress: No Stress Concern Present (04/24/2024)  Tobacco Use: Medium Risk (04/27/2024)  Health Literacy: Adequate Health Literacy (04/27/2024)   See flowsheets for full screening details  Depression Screen PHQ 2 & 9 Depression Scale- Over the past 2 weeks, how often have you been bothered by any of the following problems? Little interest or pleasure in doing things: 0 Feeling down, depressed, or hopeless (PHQ  Adolescent also includes...irritable): 0 PHQ-2 Total Score: 0     Goals Addressed               This Visit's Progress     I would like to get my legs better;no pain        COMPLETED: No current goals (pt-stated)        Patient Stated        04/27/24-still working on it-I would like to get the parathyroid  problem resolved             Objective:    Today's Vitals   04/27/24 0814  BP: 128/78  Weight: 127 lb 6.4 oz (57.8 kg)  Height: 5' 8 (1.727 m)   Body mass index is 19.37 kg/m.  Hearing/Vision screen Vision Screening - Comments:: UTD w/ Dr Clarence Immunizations and Health Maintenance Health Maintenance  Topic Date Due   Zoster Vaccines- Shingrix (1 of 2) Never done   Influenza Vaccine  12/23/2023   Medicare Annual Wellness (AWV)  04/26/2024   Bone Density Scan  09/05/2024   Mammogram  03/27/2025   Colonoscopy  08/03/2026   DTaP/Tdap/Td (3 - Td or Tdap) 04/02/2029   Pneumococcal Vaccine: 50+ Years  Completed   Hepatitis C Screening  Completed   Meningococcal B Vaccine  Aged Out   COVID-19 Vaccine  Discontinued   Fecal DNA (Cologuard)  Discontinued        Assessment/Plan:  This is a routine wellness examination for Candice Gray.  Patient Care Team: Avelina Greig BRAVO, MD as PCP - General Avel Jon NOVAK, OD (Optometry)  I have personally reviewed and noted the following in the patient's chart:   Medical and social history Use of alcohol, tobacco or illicit drugs  Current medications and supplements including opioid prescriptions. Functional ability and status Nutritional status Physical activity Advanced directives List of other physicians Hospitalizations, surgeries, and ER visits in previous 12 months Vitals Screenings to include cognitive, depression, and falls Referrals and appointments  No orders of the defined types were placed in this encounter. Pt declines Influenza vaccine In addition, I have reviewed and discussed with patient certain  preventive protocols, quality metrics, and best practice recommendations. A written personalized care plan for preventive services as well as general preventive health recommendations were provided to patient.   Erminio LITTIE Saris, LPN   [id number]    After Visit Summary: (In Person-Declined) Patient declined AVS at this time. Pt says she will view in MyChart  Nurse Notes: Pt has concerns about pain she is experiencing still in her legs and now knees sometimes. Wants to discuss at appt next week w/PCP. Lab visit/AWV/CPE made simultaneously for one year

## 2024-04-27 NOTE — Progress Notes (Signed)
 No critical labs need to be addressed urgently. We will discuss labs in detail at upcoming office visit.

## 2024-04-27 NOTE — Patient Instructions (Signed)
 Ms. Iafrate,  Thank you for taking the time for your Medicare Wellness Visit. I appreciate your continued commitment to your health goals. Please review the care plan we discussed, and feel free to reach out if I can assist you further.  Please note that Annual Wellness Visits do not include a physical exam. Some assessments may be limited, especially if the visit was conducted virtually. If needed, we may recommend an in-person follow-up with your provider.  Ongoing Care Seeing your primary care provider every 3 to 6 months helps us  monitor your health and provide consistent, personalized care.   Referrals If a referral was made during today's visit and you haven't received any updates within two weeks, please contact the referred provider directly to check on the status.  Recommended Screenings:  Health Maintenance  Topic Date Due   Zoster (Shingles) Vaccine (1 of 2) Never done   Flu Shot  12/23/2023   Medicare Annual Wellness Visit  04/26/2024   Osteoporosis screening with Bone Density Scan  09/05/2024   Breast Cancer Screening  03/27/2025   Colon Cancer Screening  08/03/2026   DTaP/Tdap/Td vaccine (3 - Td or Tdap) 04/02/2029   Pneumococcal Vaccine for age over 4  Completed   Hepatitis C Screening  Completed   Meningitis B Vaccine  Aged Out   COVID-19 Vaccine  Discontinued   Cologuard (Stool DNA test)  Discontinued       04/24/2024    5:15 PM  Advanced Directives  Does Patient Have a Medical Advance Directive? Yes  Type of Advance Directive Living will;Healthcare Power of Attorney  Copy of Healthcare Power of Attorney in Chart? No - copy requested    Vision: Annual vision screenings are recommended for early detection of glaucoma, cataracts, and diabetic retinopathy. These exams can also reveal signs of chronic conditions such as diabetes and high blood pressure.  Dental: Annual dental screenings help detect early signs of oral cancer, gum disease, and other conditions  linked to overall health, including heart disease and diabetes.

## 2024-05-01 ENCOUNTER — Encounter: Admitting: Family Medicine

## 2024-05-01 ENCOUNTER — Ambulatory Visit: Admitting: Family Medicine

## 2024-05-10 ENCOUNTER — Ambulatory Visit: Admitting: Family Medicine

## 2024-05-10 ENCOUNTER — Encounter: Payer: Self-pay | Admitting: Family Medicine

## 2024-05-10 VITALS — BP 130/80 | HR 70 | Temp 99.8°F | Ht 67.5 in | Wt 125.2 lb

## 2024-05-10 DIAGNOSIS — R569 Unspecified convulsions: Secondary | ICD-10-CM

## 2024-05-10 DIAGNOSIS — R946 Abnormal results of thyroid function studies: Secondary | ICD-10-CM | POA: Diagnosis not present

## 2024-05-10 DIAGNOSIS — Z0000 Encounter for general adult medical examination without abnormal findings: Secondary | ICD-10-CM

## 2024-05-10 DIAGNOSIS — I700 Atherosclerosis of aorta: Secondary | ICD-10-CM | POA: Diagnosis not present

## 2024-05-10 DIAGNOSIS — R7303 Prediabetes: Secondary | ICD-10-CM

## 2024-05-10 DIAGNOSIS — I10 Essential (primary) hypertension: Secondary | ICD-10-CM

## 2024-05-10 DIAGNOSIS — E785 Hyperlipidemia, unspecified: Secondary | ICD-10-CM | POA: Diagnosis not present

## 2024-05-10 DIAGNOSIS — M791 Myalgia, unspecified site: Secondary | ICD-10-CM

## 2024-05-10 NOTE — Assessment & Plan Note (Signed)
 Chronic and bilateral lower legs.  Patient describes as muscle ache as opposed to joint related pain. Unfortunately no improvement after parathyroidectomy. No benefit from stopping statin. Given autoimmune testing nonspecifically was elevated last year we will recheck this.  Would consider referral to rheumatology.  She does have history of Raynaud's.   Also recheck magnesium  as she has been low in the past. If no clear laboratory cause consider Dopplers to verify blood flow in legs or further evaluation of low back for possible spinal stenosis.

## 2024-05-10 NOTE — Assessment & Plan Note (Signed)
 On atorvastatin 10 mg p.o. daily

## 2024-05-10 NOTE — Assessment & Plan Note (Signed)
Chronic, no seizures on Lamictal 100 mg 2 times daily.  Followed by neurology.

## 2024-05-10 NOTE — Assessment & Plan Note (Signed)
 Chronic, stable Discussed decreasing carbohydrates without further weight loss.  Increase protein source for calories.

## 2024-05-10 NOTE — Assessment & Plan Note (Signed)
Stable, chronic.  Continue current medication.  Amlodipine 2.5 mg p.o. daily Metoprolol 25 mg half tablet twice daily

## 2024-05-10 NOTE — Assessment & Plan Note (Signed)
 Workup in 2024.  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  Will reevaluate to make sure thyroid  testing has normalized.

## 2024-05-10 NOTE — Assessment & Plan Note (Signed)
 Chronic, tolerable control on atorvastatin  10 mg daily

## 2024-05-10 NOTE — Progress Notes (Signed)
 Patient ID: Candice Gray Gretta, female    DOB: Feb 09, 1956, 68 y.o.   MRN: [id number]  This visit was conducted in person.  BP 130/80   Pulse 70   Temp 99.8 F (37.7 C) (Temporal)   Ht 5' 7.5 (1.715 m)   Wt 125 lb 4 oz (56.8 kg)   SpO2 98%   BMI 19.33 kg/m    CC:  Chief Complaint  Patient presents with   Annual Exam    Part 2 MWV 04/27/24    Subjective:   HPI: Candice Gray is a 68 y.o. female presenting on 05/10/2024 for Annual Exam (Part 2/MWV 04/27/24)  The patient presents for review of chronic health problems. He/She also has the following acute concerns today:   AMW 04/27/2024  Hypertension:  At goal in office today on amlodipine  2.5 mg p.o. daily and metoprolol  25 mg half tablet twice daily BP Readings from Last 3 Encounters:  05/10/24 130/80  04/27/24 128/78  10/25/23 124/74  Using medication without problems or lightheadedness:  none Chest pain with exertion: none Edema: none Short of breath:none Average home BPs: Other issues:   prediabetes Lab Results  Component Value Date   HGBA1C 6.1 04/27/2024    Elevated Cholesterol: Almost at goal less than 70 given aortic atherosclerosis on statin atorvastatin  10 mg p.o. daily Lab Results  Component Value Date   CHOL 193 04/27/2024   HDL 94.10 04/27/2024   LDLCALC 90 04/27/2024   LDLDIRECT 74.6 12/31/2011   TRIG 45.0 04/27/2024   CHOLHDL 2 04/27/2024  Using medications without problems: Muscle aches:  Diet compliance: stable Exercise:  walking, but less lately Other complaints:   Hyperparathyroid,SIADH ( recent low at 128 ( was 131 on 02/2022)), low mg followed by ENDO Dr. Faythe. S/P parathyroid  surgery 05/2023 Dr.Gerkin. On Reclast  for osteoporosis   Myalgia: Eval in 01/2023.. noted high calcium , abn thyroid  tests  She continues to have muscle pain in legs even after parathyroid  surgery.... muscle ache.   No better after holding statin.  Has Raynauds in hands.  No family history of autoimmune  issue. Noted in Seizures:  No seizures. Stable on lamictal  for 4 years. Neuro  Dr. Onita   Started back taking vit D   Relevant past medical, surgical, family and social history reviewed and updated as indicated. Interim medical history since our last visit reviewed. Allergies and medications reviewed and updated. Outpatient Medications Prior to Visit  Medication Sig Dispense Refill   amLODipine  (NORVASC ) 2.5 MG tablet TAKE 1 TABLET BY MOUTH TWICE DAILY 180 tablet 3   atorvastatin  (LIPITOR) 10 MG tablet TAKE ONE TABLET BY MOUTH EVERY MONDAY, WEDNESDAY, AND FRIDAY 36 tablet 3   Cholecalciferol (VITAMIN D3) 1000 units CAPS Take 1 capsule by mouth daily.     hydrocortisone 2.5 % cream Apply 1 application  topically daily as needed (facial break outs).     lamoTRIgine  (LAMICTAL ) 100 MG tablet Take 1 tablet (100 mg total) by mouth 2 (two) times daily. 180 tablet 4   latanoprost  (XALATAN ) 0.005 % ophthalmic solution Place 1 drop into both eyes at bedtime.     metoprolol  tartrate (LOPRESSOR ) 25 MG tablet TAKE 1/2 TABLET BY MOUTH TWICE DAILY 90 tablet 3   zoledronic  acid (RECLAST ) 5 MG/100ML SOLN injection Inject 5 mg into the vein See admin instructions. Once a year     No facility-administered medications prior to visit.     Per HPI unless specifically indicated in ROS section below Review  of Systems  Constitutional:  Negative for fatigue and fever.  HENT:  Negative for congestion.   Eyes:  Negative for pain.  Respiratory:  Negative for cough and shortness of breath.   Cardiovascular:  Negative for chest pain, palpitations and leg swelling.  Gastrointestinal:  Negative for abdominal pain.  Genitourinary:  Negative for dysuria and vaginal bleeding.  Musculoskeletal:  Negative for back pain.  Neurological:  Negative for syncope, light-headedness and headaches.  Psychiatric/Behavioral:  Negative for dysphoric mood.    Objective:  BP 130/80   Pulse 70   Temp 99.8 F (37.7 C) (Temporal)    Ht 5' 7.5 (1.715 m)   Wt 125 lb 4 oz (56.8 kg)   SpO2 98%   BMI 19.33 kg/m   Wt Readings from Last 3 Encounters:  05/10/24 125 lb 4 oz (56.8 kg)  04/27/24 127 lb 6.4 oz (57.8 kg)  10/25/23 125 lb (56.7 kg)      Physical Exam Constitutional:      General: She is not in acute distress.    Appearance: Normal appearance. She is well-developed. She is not ill-appearing or toxic-appearing.  HENT:     Head: Normocephalic.     Right Ear: Hearing, tympanic membrane, ear canal and external ear normal. Tympanic membrane is not erythematous, retracted or bulging.     Left Ear: Hearing, tympanic membrane, ear canal and external ear normal. Tympanic membrane is not erythematous, retracted or bulging.     Nose: No mucosal edema or rhinorrhea.     Right Sinus: No maxillary sinus tenderness or frontal sinus tenderness.     Left Sinus: No maxillary sinus tenderness or frontal sinus tenderness.     Mouth/Throat:     Pharynx: Uvula midline.  Eyes:     General: Lids are normal. Lids are everted, no foreign bodies appreciated.     Conjunctiva/sclera: Conjunctivae normal.     Pupils: Pupils are equal, round, and reactive to light.  Neck:     Thyroid : No thyroid  mass or thyromegaly.     Vascular: No carotid bruit.     Trachea: Trachea normal.  Cardiovascular:     Rate and Rhythm: Normal rate and regular rhythm.     Pulses: Normal pulses.     Heart sounds: Normal heart sounds, S1 normal and S2 normal. No murmur heard.    No friction rub. No gallop.  Pulmonary:     Effort: Pulmonary effort is normal. No tachypnea or respiratory distress.     Breath sounds: Normal breath sounds. No decreased breath sounds, wheezing, rhonchi or rales.  Abdominal:     General: Bowel sounds are normal.     Palpations: Abdomen is soft.     Tenderness: There is no abdominal tenderness.  Musculoskeletal:     Cervical back: Normal range of motion and neck supple.  Skin:    General: Skin is warm and dry.     Findings:  No rash.  Neurological:     Mental Status: She is alert.  Psychiatric:        Mood and Affect: Mood is not anxious or depressed.        Speech: Speech normal.        Behavior: Behavior normal. Behavior is cooperative.        Thought Content: Thought content normal.        Judgment: Judgment normal.       Results for orders placed or performed in visit on 04/27/24  VITAMIN D  25 Hydroxy (Vit-D  Deficiency, Fractures)   Collection Time: 04/27/24  7:39 AM  Result Value Ref Range   VITD 23.70 (L) 30.00 - 100.00 ng/mL  Comprehensive metabolic panel   Collection Time: 04/27/24  7:39 AM  Result Value Ref Range   Sodium 132 (L) 135 - 145 mEq/L   Potassium 4.6 3.5 - 5.1 mEq/L   Chloride 94 (L) 96 - 112 mEq/L   CO2 30 19 - 32 mEq/L   Glucose, Bld 155 (H) 70 - 99 mg/dL   BUN 17 6 - 23 mg/dL   Creatinine, Ser 9.28 0.40 - 1.20 mg/dL   Total Bilirubin 0.4 0.2 - 1.2 mg/dL   Alkaline Phosphatase 53 39 - 117 U/L   AST 14 0 - 37 U/L   ALT 11 0 - 35 U/L   Total Protein 7.0 6.0 - 8.3 g/dL   Albumin  4.1 3.5 - 5.2 g/dL   GFR 12.73 >39.99 mL/min   Calcium  9.6 8.4 - 10.5 mg/dL  Hemoglobin J8r   Collection Time: 04/27/24  7:39 AM  Result Value Ref Range   Hgb A1c MFr Bld 6.1 4.6 - 6.5 %  Lipid panel   Collection Time: 04/27/24  7:39 AM  Result Value Ref Range   Cholesterol 193 0 - 200 mg/dL   Triglycerides 54.9 0.0 - 149.0 mg/dL   HDL 05.89 >60.99 mg/dL   VLDL 9.0 0.0 - 59.9 mg/dL   LDL Cholesterol 90 0 - 99 mg/dL   Total CHOL/HDL Ratio 2    NonHDL 98.63      COVID 19 screen:  No recent travel or known exposure to COVID19 The patient denies respiratory symptoms of COVID 19 at this time. The importance of social distancing was discussed today.   Assessment and Plan   The patient's preventative maintenance and recommended screening tests for an annual wellness exam were reviewed in full today. Brought up to date unless services declined.  Counselled on the importance of diet,  exercise, and its role in overall health and mortality. The patient's FH and SH was reviewed, including their home life, tobacco status, and drug and alcohol status.   Last DEXA  2015 worsening osteopenia, stopped boniva . 2017 osteoporosis ENDO treating.. Started Reclast  2019 per ENDO.SABRA  last DEXA 09/06/2022 Last colon: 2011 was bengin polyps.. No repeat screen in this way given small caliber colon. Positive cologuard 2024.. 2024 colonoscopy, Dr. Shila. Repeat in 3 years  Mammo nml 03/2024 nml Pap q 5 years, last nml in 2021  neg HPV, no further indicated. Counseled against marijuana use.   Nonsmoker Hep C: neg Vaccines: refused COVID,  Uptodate with td, PNA and flu, consider shingrix.  Problem List Items Addressed This Visit     Abnormal thyroid  function test   Workup in 2024.  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  Will reevaluate to make sure thyroid  testing has normalized.      Relevant Orders   TSH   T3, Free   T4, Free   Aortic atherosclerosis   On atorvastatin  10 mg p.o. daily      Hyperlipidemia   Chronic, tolerable control on atorvastatin  10 mg daily      HYPERTENSION, BENIGN ESSENTIAL, LABILE   Stable, chronic.  Continue current medication.  Amlodipine  2.5 mg p.o. daily Metoprolol  25 mg half tablet twice daily      Hypomagnesemia   Myalgia   Chronic and bilateral lower legs.  Patient describes as muscle ache as opposed to joint related pain. Unfortunately  no improvement after parathyroidectomy. No benefit from stopping statin. Given autoimmune testing nonspecifically was elevated last year we will recheck this.  Would consider referral to rheumatology.  She does have history of Raynaud's.   Also recheck magnesium  as she has been low in the past. If no clear laboratory cause consider Dopplers to verify blood flow in legs or further evaluation of low back for possible spinal stenosis.       Relevant Orders   ANA    Sedimentation rate   C-reactive protein   Magnesium    Prediabetes   Chronic, stable Discussed decreasing carbohydrates without further weight loss.  Increase protein source for calories.      Seizures (HCC)   Chronic, no seizures on Lamictal  100 mg 2 times daily.  Followed by neurology.      Other Visit Diagnoses       Routine general medical examination at a health care facility    -  Primary         Greig Ring, MD

## 2024-05-11 LAB — SEDIMENTATION RATE: Sed Rate: 22 mm/h (ref 0–30)

## 2024-05-11 LAB — TSH: TSH: 2.99 u[IU]/mL (ref 0.35–5.50)

## 2024-05-11 LAB — T3, FREE: T3, Free: 3.3 pg/mL (ref 2.3–4.2)

## 2024-05-11 LAB — MAGNESIUM: Magnesium: 1.9 mg/dL (ref 1.5–2.5)

## 2024-05-11 LAB — T4, FREE: Free T4: 0.84 ng/dL (ref 0.60–1.60)

## 2024-05-13 LAB — ANA: Anti Nuclear Antibody (ANA): POSITIVE — AB

## 2024-05-13 LAB — ANTI-NUCLEAR AB-TITER (ANA TITER): ANA Titer 1: 1:80 {titer} — ABNORMAL HIGH

## 2024-05-13 LAB — C-REACTIVE PROTEIN: CRP: 29 mg/L — ABNORMAL HIGH

## 2024-05-16 ENCOUNTER — Ambulatory Visit: Payer: Self-pay | Admitting: Family Medicine

## 2024-05-16 DIAGNOSIS — R7689 Other specified abnormal immunological findings in serum: Secondary | ICD-10-CM

## 2024-05-16 DIAGNOSIS — R7982 Elevated C-reactive protein (CRP): Secondary | ICD-10-CM

## 2024-05-16 DIAGNOSIS — I7300 Raynaud's syndrome without gangrene: Secondary | ICD-10-CM

## 2024-05-16 DIAGNOSIS — M791 Myalgia, unspecified site: Secondary | ICD-10-CM

## 2024-05-18 ENCOUNTER — Other Ambulatory Visit: Payer: Self-pay | Admitting: Family Medicine

## 2024-05-18 NOTE — Telephone Encounter (Signed)
 Copied from CRM #[id number]. Topic: Clinical - Medication Refill >> May 18, 2024 11:11 AM Tanazia G wrote: Medication:  metoprolol  tartrate (LOPRESSOR ) 25 MG tablet Patient is requesting a urgent refill, she is completely out of medication and attempted to request a refill.  Has the patient contacted their pharmacy? Yes (Agent: If no, request that the patient contact the pharmacy for the refill. If patient does not wish to contact the pharmacy document the reason why and proceed with request.) (Agent: If yes, when and what did the pharmacy advise?)  This is the patient's preferred pharmacy:  [facility] - [place], [place] - [address] [place] [address] Phone: [phone] Fax: [phone]  Is this the correct pharmacy for this prescription? Yes If no, delete pharmacy and type the correct one.   Has the prescription been filled recently? Yes  Is the patient out of the medication? Yes  Has the patient been seen for an appointment in the last year OR does the patient have an upcoming appointment? Yes  Can we respond through MyChart? Yes  Agent: Please be advised that Rx refills may take up to 3 business days. We ask that you follow-up with your pharmacy.

## 2024-05-18 NOTE — Telephone Encounter (Signed)
 Duplicate request. Pharmacy already requested refill.

## 2024-06-14 NOTE — Progress Notes (Unsigned)
 "   Consult Note  Patient: Candice Gray             Date of Birth: 10-16-1955           MRN: [id number]             Visit Date: 06/19/2024  Referring Provider: Avelina Greig BRAVO, MD  Thank you for entrusting me in the care of this patient! Below are my findings:  Subjective:   Chief Complaint: No chief complaint on file.   Discussed the use of AI scribe software for clinical note transcription with the patient, who gave verbal consent to proceed.  History of Present Illness: Candice Gray is a 69 y.o. female *** who presents to the clinic today for evaluation of ***. She has a PMH of ***.   Discussed the use of AI scribe software for clinical note transcription with the patient, who gave verbal consent to proceed.  History of Present Illness   Activities of Daily Living:  Patient reports morning stiffness for *** {minute/hour:19697}.   Patient {ACTIONS;DENIES/REPORTS:21021675::Denies} nocturnal pain.  Difficulty dressing/grooming: {ACTIONS;DENIES/REPORTS:21021675::Denies} Difficulty climbing stairs: {ACTIONS;DENIES/REPORTS:21021675::Denies} Difficulty getting out of chair: {ACTIONS;DENIES/REPORTS:21021675::Denies} Difficulty using hands for taps, buttons, cutlery, and/or writing: {ACTIONS;DENIES/REPORTS:21021675::Denies}  Labs From Referral: 05/10/2024 - ANA 1:80 nuclear speckled, CRP 29.0 Sed rate wnl  Review of Systems: No Rheumatology ROS completed.   Medication List: Current Medications[1]   Allergies:  Azelastine hcl, Depo-medrol  [methylprednisolone ], and Miralax [polyethylene glycol (macrogol)]  Immunization status:  Immunization History  Administered Date(s) Administered   INFLUENZA, HIGH DOSE SEASONAL PF 03/04/2021, 03/12/2022   Influenza,inj,Quad PF,6+ Mos 03/25/2017, 03/28/2018, 03/01/2019, 03/04/2020   PNEUMOCOCCAL CONJUGATE-20 04/10/2021   Pneumococcal Polysaccharide-23 04/14/2010   Td 11/01/2008   Tdap 04/03/2019     Problem List:   Patient Active Problem List   Diagnosis Date Noted   Aortic atherosclerosis 07/14/2022   Hyperparathyroidism 04/03/2019   Seizures (HCC) 04/01/2016   Marijuana use 03/25/2016   Prediabetes 03/12/2016   SIADH (syndrome of inappropriate ADH production) 11/01/2008   Hyperlipidemia 08/13/2008   Essential hypertension 08/13/2008   Mitral valve disorder 08/13/2008   History of diverticulosis 08/13/2008   Osteoporosis 08/13/2008    History: Past Medical History:  Diagnosis Date   Anxiety    Dermatitis, seborrheic 2018   Deviated nasal septum    Diverticulitis    Diverticulosis    Glaucoma    Heart murmur 08/24/1991   Irregular heart beat, Mitral valve prolapse   HTN (hypertension)    Hyperlipemia    Hyperplastic colon polyp    Hyponatremia    Hyponatremia    MVP (mitral valve prolapse)    Osteoporosis    Parathyroid  abnormality    Pneumonia    Pre-diabetes    Raynaud phenomenon    Seizures (HCC)    SIADH (syndrome of inappropriate ADH production)    Thyroid  disease     Family History  Problem Relation Age of Onset   Transient ischemic attack Mother    Dementia Mother    COPD Mother    Vision loss Mother    Varicose Veins Mother    Hyperlipidemia Father    Hypertension Father    Diabetes Father    Atrial fibrillation Father    Hearing loss Father    Vision loss Father    Hyperlipidemia Brother    Coronary artery disease Maternal Grandmother    Diabetes Maternal Grandmother    Diabetes Paternal Grandmother    Cancer Paternal Grandfather  brain tumor   Hyperlipidemia Other    Hypertension Other    Diabetes Paternal Uncle    Hypertension Brother    Varicose Veins Maternal Aunt    Colon cancer Neg Hx    Esophageal cancer Neg Hx    Rectal cancer Neg Hx    Stomach cancer Neg Hx    Colon polyps Neg Hx    Past Surgical History:  Procedure Laterality Date   CATARACT EXTRACTION, BILATERAL  2019   CRYOTHERAPY     for cervical dysplasia   EYE SURGERY   August-September , 2019   Cataract surgery, both eyes   NM MYOCAR PERF WALL MOTION  11/2007   bruce myoview; perfusion defect in anterior myocardium (breast attenuation); post-stress EF 77%; normal, low risk study    PARATHYROIDECTOMY Right 06/17/2023   Procedure: RIGHT INFERIOR PARATHYROIDECTOMY;  Surgeon: Eletha Boas, MD;  Location: [facility];  Service: General;  Laterality: Right;   TRANSTHORACIC ECHOCARDIOGRAM  11/2007   EF=>55%; mild MR; trace TR   TUBAL LIGATION     Social History   Social History Narrative   Lives at husband and 2 sons   Right-handed   Regular exercise--yes   Caffeine: daily coffee   Diet: fruits and veggies    Objective: Vital Signs: There were no vitals taken for this visit.  Physical Exam   Imaging: No results found.  Assessment & Plan   Assessment and Plan Assessment & Plan    Follow-Up Instructions:  No follow-ups on file.   Procedures: No procedures performed  Daved GORMAN Holstein, PA-C  Note - This record has been created using [employer]. Chart creation errors have been sought, but may not always have been located. Such creation errors do not reflect on the standard of medical care.     [1]  Current Outpatient Medications:    amLODipine  (NORVASC ) 2.5 MG tablet, TAKE 1 TABLET BY MOUTH TWICE DAILY, Disp: 180 tablet, Rfl: 3   atorvastatin  (LIPITOR) 10 MG tablet, TAKE ONE TABLET BY MOUTH EVERY MONDAY, WEDNESDAY, AND FRIDAY, Disp: 36 tablet, Rfl: 3   Cholecalciferol (VITAMIN D3) 1000 units CAPS, Take 1 capsule by mouth daily., Disp: , Rfl:    hydrocortisone 2.5 % cream, Apply 1 application  topically daily as needed (facial break outs)., Disp: , Rfl:    lamoTRIgine  (LAMICTAL ) 100 MG tablet, Take 1 tablet (100 mg total) by mouth 2 (two) times daily., Disp: 180 tablet, Rfl: 4   latanoprost  (XALATAN ) 0.005 % ophthalmic solution, Place 1 drop into both eyes at bedtime., Disp: , Rfl:    metoprolol  tartrate (LOPRESSOR ) 25 MG tablet, TAKE 1/2  TABLET BY MOUTH TWICE DAILY, Disp: 90 tablet, Rfl: 3   zoledronic  acid (RECLAST ) 5 MG/100ML SOLN injection, Inject 5 mg into the vein See admin instructions. Once a year, Disp: , Rfl:   "

## 2024-06-19 ENCOUNTER — Ambulatory Visit

## 2024-06-20 NOTE — Progress Notes (Unsigned)
 "   Consult Note  Patient: Candice Gray             Date of Birth: Sep 27, 1955           MRN: [id number]             Visit Date: 06/21/2024  Referring Provider: Avelina Greig BRAVO, MD  Thank you for entrusting me in the care of this patient! Below are my findings:  Subjective:   Chief Complaint: No chief complaint on file.  Discussed the use of AI scribe software for clinical note transcription with the patient, who gave verbal consent to proceed. History of Present Illness Candice Gray is a 69 y.o. female  who presents to the clinic today for evaluation of myalgias and Raynaud's phenomenon. She also has a positive ANA and elevated CRP. She has a PMH of hypertension, HLD, prediabetes, osteoporosis, seizures, vit d deficiency, SIADH, and post surgical hyperparathyroidism.   She has been experiencing progressive stiffness and pain in her legs over the past two years, with a notable worsening in the last six months. The symptoms are particularly problematic when getting up and down, affecting her knees, especially the right knee. She has difficulty with lower toilets and experiences increased difficulty at night when getting up to go to the bathroom. The stiffness is described as an ache located in the back of her legs, and she is unsure if it involves muscles or tendons. She does not have difficulty walking but is cautious when descending stairs due to balance issues.  She underwent parathyroid  surgery last January after years of taking calcium  supplements and experiencing fluctuating calcium  levels. She was advised to stop taking calcium  supplements, which improved her arm symptoms but not her leg symptoms. Despite the surgery, her leg condition has not improved and may have worsened.  She has a history of SIADH and experienced a seizure about eight years ago due to dehydration, stress, and other factors. She takes lamotrigine  for this condition and has not had another seizure since.  Occasionally, she experiences dizziness, which causes concern due to her past seizure history.  She also has Raynaud's phenomenon, diagnosed approximately thirty years ago. No family history of autoimmune diseases and no history of blood clots. She experiences morning stiffness in her joints that lasts only a few seconds and denies fatigue, ulcers, or difficulty swallowing.  Labs From Referral: 05/10/2024 - ANA 1:80 nuclear speckled, CRP 29.0 Sed rate wnl  Review of Systems: Review of Systems  Constitutional:  Negative for fatigue.  HENT:  Negative for mouth sores and mouth dryness.   Eyes:  Negative for dryness.  Respiratory:  Negative for shortness of breath.   Cardiovascular:  Negative for chest pain and palpitations.  Gastrointestinal:  Negative for blood in stool, constipation and diarrhea.  Endocrine: Negative for increased urination.  Genitourinary:  Negative for involuntary urination.  Musculoskeletal:  Positive for morning stiffness. Negative for joint pain, gait problem, joint pain, joint swelling, myalgias, muscle weakness, muscle tenderness and myalgias.  Skin:  Positive for color change. Negative for rash, hair loss and sensitivity to sunlight.  Allergic/Immunologic: Negative for susceptible to infections.  Neurological:  Positive for dizziness. Negative for numbness and headaches.  Hematological:  Negative for swollen glands.  Psychiatric/Behavioral:  Negative for depressed mood and sleep disturbance. The patient is not nervous/anxious.    Medication List: Current Medications[1]   Allergies:  Azelastine hcl, Depo-medrol  [methylprednisolone ], and Miralax [polyethylene glycol (macrogol)]  Immunization status:  Immunization  History  Administered Date(s) Administered   INFLUENZA, HIGH DOSE SEASONAL PF 03/04/2021, 03/12/2022   Influenza,inj,Quad PF,6+ Mos 03/25/2017, 03/28/2018, 03/01/2019, 03/04/2020   PNEUMOCOCCAL CONJUGATE-20 04/10/2021   Pneumococcal Polysaccharide-23  04/14/2010   Td 11/01/2008   Tdap 04/03/2019     Problem List:  Patient Active Problem List   Diagnosis Date Noted   Aortic atherosclerosis 07/14/2022   Hyperparathyroidism 04/03/2019   Seizures (HCC) 04/01/2016   Marijuana use 03/25/2016   Prediabetes 03/12/2016   SIADH (syndrome of inappropriate ADH production) 11/01/2008   Hyperlipidemia 08/13/2008   Essential hypertension 08/13/2008   Mitral valve disorder 08/13/2008   History of diverticulosis 08/13/2008   Osteoporosis 08/13/2008   History: Past Medical History:  Diagnosis Date   Anxiety    Dermatitis, seborrheic 2018   Deviated nasal septum    Diverticulitis    Diverticulosis    Glaucoma    Heart murmur 08/24/1991   Irregular heart beat, Mitral valve prolapse   HTN (hypertension)    Hyperlipemia    Hyperplastic colon polyp    Hyponatremia    Hyponatremia    MVP (mitral valve prolapse)    Osteoporosis    Parathyroid  abnormality    Pneumonia    Pre-diabetes    Raynaud phenomenon    Seizures (HCC)    SIADH (syndrome of inappropriate ADH production)    Thyroid  disease     Family History  Problem Relation Age of Onset   Transient ischemic attack Mother    Dementia Mother    COPD Mother    Vision loss Mother    Varicose Veins Mother    Hyperlipidemia Father    Hypertension Father    Diabetes Father    Atrial fibrillation Father    Hearing loss Father    Vision loss Father    Hyperlipidemia Brother    Hypertension Brother    Varicose Veins Maternal Aunt    Diabetes Paternal Uncle    Coronary artery disease Maternal Grandmother    Diabetes Maternal Grandmother    Diabetes Paternal Grandmother    Cancer Paternal Grandfather        brain tumor   Hyperlipidemia Other    Hypertension Other    Colon cancer Neg Hx    Esophageal cancer Neg Hx    Rectal cancer Neg Hx    Stomach cancer Neg Hx    Colon polyps Neg Hx    Past Surgical History:  Procedure Laterality Date   CATARACT EXTRACTION, BILATERAL   2019   CRYOTHERAPY     for cervical dysplasia   EYE SURGERY  August-September , 2019   Cataract surgery, both eyes   NM MYOCAR PERF WALL MOTION  11/2007   bruce myoview; perfusion defect in anterior myocardium (breast attenuation); post-stress EF 77%; normal, low risk study    PARATHYROIDECTOMY Right 06/17/2023   Procedure: RIGHT INFERIOR PARATHYROIDECTOMY;  Surgeon: Eletha Boas, MD;  Location: [facility];  Service: General;  Laterality: Right;   TRANSTHORACIC ECHOCARDIOGRAM  11/2007   EF=>55%; mild MR; trace TR   TUBAL LIGATION     Social History   Social History Narrative   Lives at husband and 2 sons   Right-handed   Regular exercise--yes   Caffeine: daily coffee   Diet: fruits and veggies    Objective: Vital Signs: BP (!) 175/81 (BP Location: Left Arm, Patient Position: Sitting, Cuff Size: Normal)   Pulse 67   Temp (!) 97.4 F (36.3 C)   Resp 15   Ht 5' 9 (1.753  m)   Wt 130 lb 12.8 oz (59.3 kg)   BMI 19.32 kg/m   Physical Exam Vitals reviewed.  Constitutional:      General: She is not in acute distress.    Appearance: Normal appearance. She is well-developed and normal weight.  HENT:     Head: Normocephalic and atraumatic. No right periorbital erythema or left periorbital erythema.     Mouth/Throat:     Mouth: Mucous membranes are moist.     Pharynx: Oropharynx is clear.  Eyes:     General: Lids are normal.        Right eye: No discharge.        Left eye: No discharge.     Extraocular Movements: Extraocular movements intact.     Conjunctiva/sclera: Conjunctivae normal.     Pupils: Pupils are equal, round, and reactive to light.  Cardiovascular:     Rate and Rhythm: Normal rate and regular rhythm.     Heart sounds: Normal heart sounds. No murmur heard.    No friction rub. No gallop.  Pulmonary:     Effort: Pulmonary effort is normal. No respiratory distress.     Breath sounds: Normal breath sounds. No stridor. No wheezing, rhonchi or rales.  Chest:      Chest wall: No deformity.  Musculoskeletal:     Right lower leg: No edema.     Left lower leg: No edema.     Comments: No synovitis present on exam today Tenderness noted in the right knee. C-spine, thoracic spine, lumbar spine have complete range of motion. No SI joint tenderness. Shoulder joints, elbow joints, wrist joints, MCPs, PIPs, DIPs have complete range of motion. Complete fist formation bilaterally.  Hip joints have good range of motion with mild pain with internal and external rotation on the left.  Knee joints have very limited range of motion, R>L with no warmth, effusion, or crepitus. Ankle joints have complete range of motion with no tenderness or joint swelling. No evidence of Achilles tendinitis or plantar fasciitis. No MTP squeeze. Ankle dorsiflexion, plantar flexion, and knee flexion and extension 4/5 strength. All other 5/5.   Lymphadenopathy:     Cervical: No cervical adenopathy.  Skin:    General: Skin is warm and moist.     Capillary Refill: Capillary refill takes less than 2 seconds.     Findings: No rash.  Neurological:     Mental Status: She is alert.     Motor: Weakness present.     Gait: Gait normal.  Psychiatric:        Mood and Affect: Mood normal.        Behavior: Behavior normal.      Imaging: No results found.  Assessment & Plan Positive ANA (antinuclear antibody) Positive ANA with no appreciable synovitis or sicca symptoms. Possible association with primary Raynaud's phenomenon but will want to rule out other connective tissue disorders today. See below for orders.  Orders:   Anti-scleroderma antibody   RNP Antibody   Anti-Smith antibody   Sjogrens syndrome-A extractable nuclear antibody   Sjogrens syndrome-B extractable nuclear antibody   Anti-DNA antibody, double-stranded   C3 and C4  Muscle weakness of lower extremity Lower extremity muscle weakness, worsened over six months, with difficulty when rising from a chair and climbing down the  stairs. Ankle dorsiflexion and plantarflexion along with knee extension and flexion 5/5 strength bilaterally. She was able to rise out of a chair without assistance but did so hesitantly. Differential includes myositis or  neuromuscular disorder if rheumatologic tests are all negative. Will consider referral to neuromuscular specialist pending serology. See below for orders. Orders:   CK   Aldolase   Myositis Specific II Antibodies Panel  Vitamin D  deficiency History of vitamin d  deficiency, currently on vitamin d  supplementation 1000 ius daily. Will recheck levels today. See below for orders. Orders:   VITAMIN D  25 Hydroxy (Vit-D Deficiency, Fractures)  Chronic pain of right knee Chronic right knee pain possibly due to osteoarthritis or a previous fall. Pain exacerbated by movement and compensatory mechanisms from lower extremity weakness. Will obtain x-ray today as she has not had any previous imaging. See below for orders. Orders:   XR KNEE 3 VIEW RIGHT   Follow-Up Instructions:  Return in about 4 weeks (around 07/19/2024) for NPT FU.   I personally spent a total of 45 minutes in the care of the patient today including preparing to see the patient, getting/reviewing separately obtained history, performing a medically appropriate exam/evaluation, counseling and educating, placing orders, documenting clinical information in the EHR, and communicating results.   Procedures: No procedures performed  Daved GORMAN Holstein, PA-C  Note - This record has been created using [employer]. Chart creation errors have been sought, but may not always have been located. Such creation errors do not reflect on the standard of medical care.     [1]  Current Outpatient Medications:    amLODipine  (NORVASC ) 2.5 MG tablet, TAKE 1 TABLET BY MOUTH TWICE DAILY, Disp: 180 tablet, Rfl: 3   atorvastatin  (LIPITOR) 10 MG tablet, TAKE ONE TABLET BY MOUTH EVERY MONDAY, WEDNESDAY, AND FRIDAY, Disp: 36 tablet, Rfl: 3    Cholecalciferol (VITAMIN D3) 1000 units CAPS, Take 1 capsule by mouth daily., Disp: , Rfl:    hydrocortisone 2.5 % cream, Apply 1 application  topically daily as needed (facial break outs)., Disp: , Rfl:    lamoTRIgine  (LAMICTAL ) 100 MG tablet, Take 1 tablet (100 mg total) by mouth 2 (two) times daily., Disp: 180 tablet, Rfl: 4   latanoprost  (XALATAN ) 0.005 % ophthalmic solution, Place 1 drop into both eyes at bedtime., Disp: , Rfl:    metoprolol  tartrate (LOPRESSOR ) 25 MG tablet, TAKE 1/2 TABLET BY MOUTH TWICE DAILY, Disp: 90 tablet, Rfl: 3   zoledronic  acid (RECLAST ) 5 MG/100ML SOLN injection, Inject 5 mg into the vein See admin instructions. Once a year, Disp: , Rfl:   "

## 2024-06-21 ENCOUNTER — Ambulatory Visit

## 2024-06-21 VITALS — BP 175/81 | HR 67 | Temp 97.4°F | Resp 15 | Ht 69.0 in | Wt 130.8 lb

## 2024-06-21 DIAGNOSIS — M25561 Pain in right knee: Secondary | ICD-10-CM | POA: Diagnosis present

## 2024-06-21 DIAGNOSIS — E559 Vitamin D deficiency, unspecified: Secondary | ICD-10-CM | POA: Diagnosis present

## 2024-06-21 DIAGNOSIS — R7689 Other specified abnormal immunological findings in serum: Secondary | ICD-10-CM | POA: Diagnosis present

## 2024-06-21 DIAGNOSIS — G8929 Other chronic pain: Secondary | ICD-10-CM | POA: Diagnosis present

## 2024-06-21 DIAGNOSIS — M6281 Muscle weakness (generalized): Secondary | ICD-10-CM | POA: Diagnosis present

## 2024-06-27 LAB — CK: Total CK: 80 U/L (ref 20–243)

## 2024-06-27 LAB — MYOSITIS SPECIFIC II ANTIBODIES PANEL
EJ AB: <11 SI
JO-1 AB: <11 SI
MDA-5 AB: <11 SI
MI-2 ALPHA AB: <11 SI
MI-2 BETA AB: <11 SI
NXP-2 AB: <11 SI
OJ AB: <11 SI
PL-12 AB: <11 SI
PL-7 AB: <11 SI
SRP-AB: <11 SI
TIF-1y AB: <11 SI

## 2024-06-27 LAB — C3 AND C4
C3 Complement: 102 mg/dL (ref 83–193)
C4 Complement: 21 mg/dL (ref 15–57)

## 2024-06-27 LAB — ANTI-SCLERODERMA ANTIBODY: Scleroderma (Scl-70) (ENA) Antibody, IgG: <1 AI

## 2024-06-27 LAB — ALDOLASE: Aldolase: 3.6 U/L

## 2024-06-27 LAB — ANTI-SMITH ANTIBODY: ENA SM Ab Ser-aCnc: <1 AI

## 2024-06-27 LAB — ANTI-DNA ANTIBODY, DOUBLE-STRANDED: ds DNA Ab: 1 [IU]/mL

## 2024-06-27 LAB — VITAMIN D 25 HYDROXY (VIT D DEFICIENCY, FRACTURES): Vit D, 25-Hydroxy: 28 ng/mL — ABNORMAL LOW (ref 30–100)

## 2024-06-27 LAB — RNP ANTIBODY: Ribonucleic Protein(ENA) Antibody, IgG: <1 AI

## 2024-06-27 LAB — SJOGRENS SYNDROME-B EXTRACTABLE NUCLEAR ANTIBODY: SSB (La) (ENA) Antibody, IgG: <1 AI

## 2024-06-27 LAB — SJOGRENS SYNDROME-A EXTRACTABLE NUCLEAR ANTIBODY: SSA (Ro) (ENA) Antibody, IgG: <1 AI

## 2024-06-28 ENCOUNTER — Ambulatory Visit: Payer: Self-pay

## 2024-07-24 ENCOUNTER — Ambulatory Visit

## 2024-10-31 ENCOUNTER — Telehealth: Admitting: Neurology

## 2025-04-26 ENCOUNTER — Other Ambulatory Visit

## 2025-05-03 ENCOUNTER — Ambulatory Visit

## 2025-05-03 ENCOUNTER — Encounter: Admitting: Family Medicine
